# Patient Record
Sex: Female | Born: 1945 | Race: White | Hispanic: No | Marital: Married | Smoking: Never smoker
Health system: Southern US, Community
[De-identification: ages and names within clinical notes are randomized; demographics above are authoritative.]

## PROBLEM LIST (undated history)

## (undated) DIAGNOSIS — E559 Vitamin D deficiency, unspecified: Secondary | ICD-10-CM

## (undated) DIAGNOSIS — F419 Anxiety disorder, unspecified: Secondary | ICD-10-CM

## (undated) DIAGNOSIS — M810 Age-related osteoporosis without current pathological fracture: Secondary | ICD-10-CM

## (undated) DIAGNOSIS — N390 Urinary tract infection, site not specified: Secondary | ICD-10-CM

## (undated) DIAGNOSIS — M48062 Spinal stenosis, lumbar region with neurogenic claudication: Secondary | ICD-10-CM

## (undated) DIAGNOSIS — E785 Hyperlipidemia, unspecified: Secondary | ICD-10-CM

## (undated) DIAGNOSIS — Z9889 Other specified postprocedural states: Secondary | ICD-10-CM

## (undated) DIAGNOSIS — Z923 Personal history of irradiation: Secondary | ICD-10-CM

## (undated) DIAGNOSIS — F329 Major depressive disorder, single episode, unspecified: Secondary | ICD-10-CM

## (undated) DIAGNOSIS — F32A Depression, unspecified: Secondary | ICD-10-CM

## (undated) DIAGNOSIS — Z9221 Personal history of antineoplastic chemotherapy: Secondary | ICD-10-CM

## (undated) DIAGNOSIS — R112 Nausea with vomiting, unspecified: Secondary | ICD-10-CM

## (undated) DIAGNOSIS — C50919 Malignant neoplasm of unspecified site of unspecified female breast: Secondary | ICD-10-CM

## (undated) DIAGNOSIS — M66849 Spontaneous rupture of other tendons, unspecified hand: Secondary | ICD-10-CM

## (undated) DIAGNOSIS — E039 Hypothyroidism, unspecified: Secondary | ICD-10-CM

## (undated) DIAGNOSIS — E079 Disorder of thyroid, unspecified: Secondary | ICD-10-CM

## (undated) DIAGNOSIS — D696 Thrombocytopenia, unspecified: Secondary | ICD-10-CM

## (undated) DIAGNOSIS — M51369 Other intervertebral disc degeneration, lumbar region without mention of lumbar back pain or lower extremity pain: Secondary | ICD-10-CM

## (undated) DIAGNOSIS — C4490 Unspecified malignant neoplasm of skin, unspecified: Secondary | ICD-10-CM

## (undated) HISTORY — DX: Hyperlipidemia, unspecified: E78.5

## (undated) HISTORY — DX: Anxiety disorder, unspecified: F41.9

## (undated) HISTORY — DX: Unspecified malignant neoplasm of skin, unspecified: C44.90

## (undated) HISTORY — DX: Malignant neoplasm of unspecified site of unspecified female breast: C50.919

## (undated) HISTORY — DX: Spontaneous rupture of other tendons, unspecified hand: M66.849

## (undated) HISTORY — PX: HARDWARE REMOVAL: SHX979

## (undated) HISTORY — DX: Depression, unspecified: F32.A

## (undated) HISTORY — PX: BREAST EXCISIONAL BIOPSY: SUR124

## (undated) HISTORY — DX: Disorder of thyroid, unspecified: E07.9

## (undated) HISTORY — DX: Major depressive disorder, single episode, unspecified: F32.9

---

## 1981-01-21 HISTORY — PX: ABDOMINAL HYSTERECTOMY: SHX81

## 1999-01-22 DIAGNOSIS — C50919 Malignant neoplasm of unspecified site of unspecified female breast: Secondary | ICD-10-CM

## 1999-01-22 HISTORY — DX: Malignant neoplasm of unspecified site of unspecified female breast: C50.919

## 1999-01-22 HISTORY — PX: BREAST BIOPSY: SHX20

## 1999-08-16 HISTORY — PX: BREAST LUMPECTOMY: SHX2

## 2001-01-21 HISTORY — PX: PATELLA FRACTURE SURGERY: SHX735

## 2001-01-21 HISTORY — PX: KNEE SURGERY: SHX244

## 2003-12-22 ENCOUNTER — Ambulatory Visit: Payer: Self-pay | Admitting: Oncology

## 2004-01-22 ENCOUNTER — Ambulatory Visit: Payer: Self-pay | Admitting: Oncology

## 2004-01-22 HISTORY — PX: OTHER SURGICAL HISTORY: SHX169

## 2004-01-22 HISTORY — PX: MOHS SURGERY: SUR867

## 2004-04-24 ENCOUNTER — Ambulatory Visit: Payer: Self-pay | Admitting: General Surgery

## 2004-06-21 ENCOUNTER — Ambulatory Visit: Payer: Self-pay | Admitting: Oncology

## 2004-07-21 ENCOUNTER — Ambulatory Visit: Payer: Self-pay | Admitting: Oncology

## 2004-09-21 DIAGNOSIS — C4490 Unspecified malignant neoplasm of skin, unspecified: Secondary | ICD-10-CM

## 2004-09-21 HISTORY — DX: Unspecified malignant neoplasm of skin, unspecified: C44.90

## 2004-10-18 ENCOUNTER — Ambulatory Visit: Payer: Self-pay | Admitting: Unknown Physician Specialty

## 2004-12-20 ENCOUNTER — Ambulatory Visit: Payer: Self-pay | Admitting: Oncology

## 2005-01-08 ENCOUNTER — Emergency Department: Payer: Self-pay | Admitting: Emergency Medicine

## 2005-06-05 ENCOUNTER — Ambulatory Visit: Payer: Self-pay | Admitting: General Surgery

## 2005-06-18 ENCOUNTER — Ambulatory Visit: Payer: Self-pay | Admitting: Oncology

## 2005-06-21 ENCOUNTER — Ambulatory Visit: Payer: Self-pay | Admitting: Oncology

## 2006-01-21 HISTORY — PX: COLONOSCOPY: SHX174

## 2006-05-22 ENCOUNTER — Ambulatory Visit: Payer: Self-pay | Admitting: Oncology

## 2006-06-04 ENCOUNTER — Ambulatory Visit: Payer: Self-pay | Admitting: General Surgery

## 2006-06-18 ENCOUNTER — Ambulatory Visit: Payer: Self-pay | Admitting: Oncology

## 2006-06-22 ENCOUNTER — Ambulatory Visit: Payer: Self-pay | Admitting: Oncology

## 2006-11-22 ENCOUNTER — Ambulatory Visit: Payer: Self-pay | Admitting: Oncology

## 2006-12-12 ENCOUNTER — Ambulatory Visit: Payer: Self-pay | Admitting: Oncology

## 2006-12-22 ENCOUNTER — Ambulatory Visit: Payer: Self-pay | Admitting: Oncology

## 2007-06-22 ENCOUNTER — Ambulatory Visit: Payer: Self-pay | Admitting: Oncology

## 2007-06-22 ENCOUNTER — Ambulatory Visit: Payer: Self-pay | Admitting: General Surgery

## 2007-06-29 ENCOUNTER — Ambulatory Visit: Payer: Self-pay | Admitting: Oncology

## 2007-07-08 ENCOUNTER — Ambulatory Visit: Payer: Self-pay

## 2007-07-22 ENCOUNTER — Ambulatory Visit: Payer: Self-pay | Admitting: Oncology

## 2007-12-22 ENCOUNTER — Ambulatory Visit: Payer: Self-pay | Admitting: Oncology

## 2007-12-25 ENCOUNTER — Ambulatory Visit: Payer: Self-pay | Admitting: Oncology

## 2008-01-16 ENCOUNTER — Emergency Department: Payer: Self-pay | Admitting: Emergency Medicine

## 2008-01-19 ENCOUNTER — Ambulatory Visit: Payer: Self-pay | Admitting: Orthopedic Surgery

## 2008-01-22 ENCOUNTER — Ambulatory Visit: Payer: Self-pay | Admitting: Oncology

## 2008-01-22 HISTORY — PX: WRIST FRACTURE SURGERY: SHX121

## 2008-01-22 HISTORY — PX: WRIST SURGERY: SHX841

## 2008-04-05 ENCOUNTER — Ambulatory Visit: Payer: Self-pay | Admitting: Unknown Physician Specialty

## 2008-06-21 ENCOUNTER — Ambulatory Visit: Payer: Self-pay | Admitting: Oncology

## 2008-06-22 ENCOUNTER — Ambulatory Visit: Payer: Self-pay | Admitting: General Surgery

## 2008-06-24 ENCOUNTER — Ambulatory Visit: Payer: Self-pay | Admitting: Oncology

## 2008-07-21 ENCOUNTER — Ambulatory Visit: Payer: Self-pay | Admitting: Oncology

## 2009-06-21 ENCOUNTER — Ambulatory Visit: Payer: Self-pay | Admitting: Oncology

## 2009-06-29 ENCOUNTER — Ambulatory Visit: Payer: Self-pay | Admitting: General Surgery

## 2009-07-05 ENCOUNTER — Ambulatory Visit: Payer: Self-pay | Admitting: Oncology

## 2009-07-21 ENCOUNTER — Ambulatory Visit: Payer: Self-pay | Admitting: Oncology

## 2010-07-02 ENCOUNTER — Ambulatory Visit: Payer: Self-pay | Admitting: General Surgery

## 2010-07-06 ENCOUNTER — Ambulatory Visit: Payer: Self-pay | Admitting: Oncology

## 2010-07-07 LAB — CANCER ANTIGEN 27.29: CA 27.29: 38.1 U/mL (ref 0.0–38.6)

## 2010-07-22 ENCOUNTER — Ambulatory Visit: Payer: Self-pay | Admitting: Oncology

## 2011-07-04 ENCOUNTER — Ambulatory Visit: Payer: Self-pay | Admitting: General Surgery

## 2011-07-08 ENCOUNTER — Ambulatory Visit: Payer: Self-pay | Admitting: Oncology

## 2011-07-08 LAB — COMPREHENSIVE METABOLIC PANEL
Albumin: 4.2 g/dL (ref 3.4–5.0)
Anion Gap: 9 (ref 7–16)
BUN: 23 mg/dL — ABNORMAL HIGH (ref 7–18)
Bilirubin,Total: 0.2 mg/dL (ref 0.2–1.0)
Chloride: 109 mmol/L — ABNORMAL HIGH (ref 98–107)
Co2: 25 mmol/L (ref 21–32)
Creatinine: 0.81 mg/dL (ref 0.60–1.30)
EGFR (African American): >60
EGFR (Non-African Amer.): >60
Osmolality: 290 (ref 275–301)
Potassium: 4.3 mmol/L (ref 3.5–5.1)
SGOT(AST): 25 U/L (ref 15–37)
SGPT (ALT): 32 U/L
Sodium: 143 mmol/L (ref 136–145)

## 2011-07-08 LAB — CBC CANCER CENTER
Basophil #: 0 x10 3/mm (ref 0.0–0.1)
Lymphocyte #: 0.9 x10 3/mm — ABNORMAL LOW (ref 1.0–3.6)
MCHC: 32.7 g/dL (ref 32.0–36.0)
Monocyte #: 0.5 x10 3/mm (ref 0.2–0.9)
Monocyte %: 11 %
Neutrophil #: 3.3 x10 3/mm (ref 1.4–6.5)
Platelet: 163 x10 3/mm (ref 150–440)
RDW: 13.4 % (ref 11.5–14.5)
WBC: 5 x10 3/mm (ref 3.6–11.0)

## 2011-07-09 LAB — CANCER ANTIGEN 27.29: CA 27.29: 30.9 U/mL (ref 0.0–38.6)

## 2011-07-22 ENCOUNTER — Ambulatory Visit: Payer: Self-pay | Admitting: Oncology

## 2012-06-05 ENCOUNTER — Encounter: Payer: Self-pay | Admitting: General Surgery

## 2012-07-21 ENCOUNTER — Ambulatory Visit: Payer: Self-pay | Admitting: General Surgery

## 2012-07-21 ENCOUNTER — Encounter: Payer: Self-pay | Admitting: General Surgery

## 2012-08-06 ENCOUNTER — Ambulatory Visit: Payer: Self-pay | Admitting: General Surgery

## 2012-08-10 ENCOUNTER — Ambulatory Visit: Payer: Self-pay | Admitting: General Surgery

## 2012-08-18 ENCOUNTER — Encounter: Payer: Self-pay | Admitting: General Surgery

## 2012-08-18 ENCOUNTER — Ambulatory Visit (INDEPENDENT_AMBULATORY_CARE_PROVIDER_SITE_OTHER): Payer: Medicare Other | Admitting: General Surgery

## 2012-08-18 VITALS — BP 140/76 | HR 74 | Resp 12 | Ht 64.0 in | Wt 162.0 lb

## 2012-08-18 DIAGNOSIS — Z853 Personal history of malignant neoplasm of breast: Secondary | ICD-10-CM

## 2012-08-18 NOTE — Patient Instructions (Signed)
Patient to return in one year. 

## 2012-08-18 NOTE — Progress Notes (Signed)
Patient ID: Jenny Roberts, female   DOB: 1945/10/22, 67 y.o.   MRN: [id number]  Chief Complaint  Patient presents with  . Follow-up    mammogram    HPI Jenny Roberts is a 67 y.o. female who if here for a 1 year follow up. The most recent mammogram was done at [facility] on 07/21/12. Cat 2 The patient has a history of breast cancer of the right breast in 2001 that was treated with lumpectomy followed by chemo and radiation therapy.  Patient does perform regular self breast checks and gets regular mammograms done.   HPI  Past Medical History  Diagnosis Date  . Thyroid disease   . Hyperlipidemia   . Anxiety   . Depression   . Breast cancer 2001    Right. Treated with lumpectomy followed by Chemotherapy and Radiation therapy  . Skin cancer 09/2004    treated at [facility]    Past Surgical History  Procedure Laterality Date  . Abdominal hysterectomy  1983  . Mole excision  2006    Basal cell   . Breast biopsy Right 2001  . Breast lumpectomy Right 2001  . Wrist surgery Right 2010  . Colonoscopy  2008  . Knee surgery  2003    Family History  Problem Relation Age of Onset  . Brain cancer Father     Social History History  Substance Use Topics  . Smoking status: Never Smoker   . Smokeless tobacco: Never Used  . Alcohol Use: No    Allergies  Allergen Reactions  . Codeine Hives    shakes  . Latex     Current Outpatient Prescriptions  Medication Sig Dispense Refill  . clonazePAM (KLONOPIN) 0.5 MG tablet Take 1 tablet by mouth daily.      Marland Kitchen levothyroxine (SYNTHROID, LEVOTHROID) 88 MCG tablet Take 1 tablet by mouth daily.      Marland Kitchen lovastatin (MEVACOR) 40 MG tablet Take 1 tablet by mouth daily.      . sertraline (ZOLOFT) 100 MG tablet Take 1 tablet by mouth daily.       No current facility-administered medications for this visit.    Review of Systems Review of Systems  Constitutional: Negative.   Respiratory: Negative.   Cardiovascular: Negative.     Blood pressure  140/76, pulse 74, resp. rate 12, height 5\' 4"  (1.626 m), weight 162 lb (73.483 kg).  Physical Exam Physical Exam  Constitutional: She is oriented to person, place, and time. She appears well-developed and well-nourished.  Eyes: No scleral icterus.  Neck: Neck supple.  Cardiovascular: Normal rate, regular rhythm, normal heart sounds and normal pulses.   Pulses:      Dorsalis pedis pulses are 2+ on the right side, and 2+ on the left side.       Posterior tibial pulses are 2+ on the right side, and 2+ on the left side.  No edema   Pulmonary/Chest: Breath sounds normal. Right breast exhibits no inverted nipple, no mass, no nipple discharge, no skin change and no tenderness. Left breast exhibits no inverted nipple, no mass, no nipple discharge, no skin change and no tenderness.  Abdominal: Soft. Normal appearance. There is no hepatomegaly. There is no tenderness. No hernia.  Lymphadenopathy:    She has no cervical adenopathy.    She has no axillary adenopathy.  Neurological: She is alert and oriented to person, place, and time.  Skin: Skin is warm and dry.    Data Reviewed Mammogram reviewed -Stable  Assessment    Stable exam     Plan    Patient to return in one year bilateral diagnotic mammogram. Ordered CA 27-29       [facility] G 08/18/2012, 11:48 AM

## 2012-08-19 ENCOUNTER — Telehealth: Payer: Self-pay | Admitting: *Deleted

## 2012-08-19 NOTE — Telephone Encounter (Signed)
Inform patient that the most recent labs were elevated per Dr Evette Cristal.  He is going to get in touch with Dr Doylene Canning to review previous lab work.  If necessary then PET scan, if not then recheck labs at a future time.

## 2012-08-19 NOTE — Telephone Encounter (Signed)
Discussed with the patient in detail.

## 2012-08-20 ENCOUNTER — Telehealth: Payer: Self-pay | Admitting: *Deleted

## 2012-08-20 NOTE — Telephone Encounter (Signed)
Notified patient as instructed, patient pleased. Discussed follow-up appointments, patient agrees  

## 2012-09-12 ENCOUNTER — Inpatient Hospital Stay: Payer: Self-pay | Admitting: Internal Medicine

## 2012-09-12 LAB — URINALYSIS, COMPLETE
Bilirubin,UR: NEGATIVE
Glucose,UR: NEGATIVE mg/dL (ref 0–75)
Ketone: NEGATIVE
Nitrite: NEGATIVE
Ph: 6 (ref 4.5–8.0)
RBC,UR: 3 /HPF (ref 0–5)
Specific Gravity: 1.02 (ref 1.003–1.030)
Squamous Epithelial: <1
WBC UR: 14 /HPF (ref 0–5)

## 2012-09-12 LAB — COMPREHENSIVE METABOLIC PANEL
Albumin: 4.4 g/dL (ref 3.4–5.0)
Alkaline Phosphatase: 105 U/L (ref 50–136)
BUN: 21 mg/dL — ABNORMAL HIGH (ref 7–18)
Bilirubin,Total: 0.5 mg/dL (ref 0.2–1.0)
Chloride: 108 mmol/L — ABNORMAL HIGH (ref 98–107)
Co2: 24 mmol/L (ref 21–32)
Creatinine: 0.98 mg/dL (ref 0.60–1.30)
EGFR (Non-African Amer.): 60 — ABNORMAL LOW
Glucose: 144 mg/dL — ABNORMAL HIGH (ref 65–99)
Potassium: 4.1 mmol/L (ref 3.5–5.1)
SGOT(AST): 29 U/L (ref 15–37)
SGPT (ALT): 30 U/L (ref 12–78)
Sodium: 140 mmol/L (ref 136–145)
Total Protein: 7.2 g/dL (ref 6.4–8.2)

## 2012-09-12 LAB — CBC
HCT: 43.3 % (ref 35.0–47.0)
Platelet: 166 10*3/uL (ref 150–440)
RBC: 4.95 10*6/uL (ref 3.80–5.20)
RDW: 13.5 % (ref 11.5–14.5)

## 2012-09-12 LAB — LIPASE, BLOOD: Lipase: 56 U/L — ABNORMAL LOW (ref 73–393)

## 2012-09-13 LAB — HEMOGLOBIN
HGB: 13.8 g/dL (ref 12.0–16.0)
HGB: 13.8 g/dL (ref 12.0–16.0)

## 2012-09-13 LAB — BASIC METABOLIC PANEL
Anion Gap: 6 — ABNORMAL LOW (ref 7–16)
BUN: 14 mg/dL (ref 7–18)
Calcium, Total: 8.9 mg/dL (ref 8.5–10.1)
EGFR (Non-African Amer.): >60

## 2012-09-13 LAB — HEMATOCRIT: HCT: 39.7 % (ref 35.0–47.0)

## 2012-09-14 LAB — BASIC METABOLIC PANEL
Anion Gap: 5 — ABNORMAL LOW (ref 7–16)
BUN: 8 mg/dL (ref 7–18)
Calcium, Total: 8.8 mg/dL (ref 8.5–10.1)
Chloride: 111 mmol/L — ABNORMAL HIGH (ref 98–107)
EGFR (Non-African Amer.): >60
Glucose: 105 mg/dL — ABNORMAL HIGH (ref 65–99)
Osmolality: 282 (ref 275–301)
Potassium: 3.7 mmol/L (ref 3.5–5.1)

## 2012-09-14 LAB — CBC WITH DIFFERENTIAL/PLATELET
Basophil #: 0 10*3/uL (ref 0.0–0.1)
Eosinophil %: 1.9 %
HCT: 37.4 % (ref 35.0–47.0)
Lymphocyte #: 0.9 10*3/uL — ABNORMAL LOW (ref 1.0–3.6)
MCHC: 34.5 g/dL (ref 32.0–36.0)
Monocyte %: 9.4 %
Neutrophil %: 76.7 %
Platelet: 139 10*3/uL — ABNORMAL LOW (ref 150–440)
RDW: 13.3 % (ref 11.5–14.5)

## 2012-09-14 LAB — URINE CULTURE

## 2012-09-16 ENCOUNTER — Other Ambulatory Visit: Payer: Self-pay

## 2012-09-16 DIAGNOSIS — Z853 Personal history of malignant neoplasm of breast: Secondary | ICD-10-CM

## 2012-09-18 LAB — CULTURE, BLOOD (SINGLE)

## 2012-09-19 LAB — CANCER ANTIGEN 27.29: CA 27.29: 38.1 U/mL (ref 0.0–38.6)

## 2012-09-22 ENCOUNTER — Telehealth: Payer: Self-pay | Admitting: *Deleted

## 2012-09-22 NOTE — Telephone Encounter (Signed)
Notified patient as instructed, patient pleased. Discussed follow-up appointments, next week with Dr Evette Cristal, patient agrees

## 2012-09-22 NOTE — Progress Notes (Signed)
Quick Note:  Inform pt labs are normal. F/u as scheduled in 1 yr ______

## 2012-09-22 NOTE — Telephone Encounter (Signed)
Message copied by Currie Paris on Tue Sep 22, 2012 10:13 AM ------      Message from: Kieth Brightly      Created: Tue Sep 22, 2012  6:36 AM       Inform pt labs are normal. F/u as scheduled in 1 yr ------

## 2012-09-29 ENCOUNTER — Ambulatory Visit (INDEPENDENT_AMBULATORY_CARE_PROVIDER_SITE_OTHER): Payer: Medicare Other | Admitting: General Surgery

## 2012-09-29 ENCOUNTER — Encounter: Payer: Self-pay | Admitting: General Surgery

## 2012-09-29 VITALS — BP 110/80 | HR 78 | Resp 12 | Ht 64.0 in | Wt 159.0 lb

## 2012-09-29 DIAGNOSIS — Z853 Personal history of malignant neoplasm of breast: Secondary | ICD-10-CM | POA: Insufficient documentation

## 2012-09-29 DIAGNOSIS — K5732 Diverticulitis of large intestine without perforation or abscess without bleeding: Secondary | ICD-10-CM

## 2012-09-29 NOTE — Progress Notes (Signed)
Patient ID: Jenny Roberts, female   DOB: 1945-03-20, 67 y.o.   MRN: [id number]  Chief Complaint  Patient presents with  . Follow-up    diverticulitis    HPI Jenny Roberts is a 67 y.o. female who presents for a follow up from diverticulitis. She had an episode 2 weeks ago with diverticulitis and UTI evaluated with CT. She has completed a course of 3 antibiotics. She was known to have diverticulosis in previous colonoscopies but has not been noted to have diverticulitis in the past.  The patient denies any new or reoccurring problems at this time. She is feeling much better.    HPI  Past Medical History  Diagnosis Date  . Thyroid disease   . Hyperlipidemia   . Anxiety   . Depression   . Breast cancer 2001    Right. Treated with lumpectomy followed by Chemotherapy and Radiation therapy  . Skin cancer 09/2004    treated at [facility]    Past Surgical History  Procedure Laterality Date  . Abdominal hysterectomy  1983  . Mole excision  2006    Basal cell   . Breast biopsy Right 2001  . Breast lumpectomy Right 2001  . Wrist surgery Right 2010  . Colonoscopy  2008  . Knee surgery  2003    Family History  Problem Relation Age of Onset  . Brain cancer Father     Social History History  Substance Use Topics  . Smoking status: Never Smoker   . Smokeless tobacco: Never Used  . Alcohol Use: No    Allergies  Allergen Reactions  . Codeine Hives    shakes  . Latex     Current Outpatient Prescriptions  Medication Sig Dispense Refill  . clonazePAM (KLONOPIN) 0.5 MG tablet Take 1 tablet by mouth daily.      Marland Kitchen levothyroxine (SYNTHROID, LEVOTHROID) 88 MCG tablet Take 1 tablet by mouth daily.      Marland Kitchen lovastatin (MEVACOR) 40 MG tablet Take 1 tablet by mouth daily.      . sertraline (ZOLOFT) 100 MG tablet Take 1 tablet by mouth daily.       No current facility-administered medications for this visit.    Review of Systems Review of Systems  Constitutional: Negative.    Respiratory: Negative.   Cardiovascular: Negative.   Gastrointestinal: Negative.     Blood pressure 110/80, pulse 78, resp. rate 12, height 5\' 4"  (1.626 m), weight 159 lb (72.122 kg).  Physical Exam Physical Exam  Constitutional: She is oriented to person, place, and time. She appears well-developed and well-nourished.  Eyes: Conjunctivae are normal. No scleral icterus.  Pulmonary/Chest: Effort normal and breath sounds normal.  Abdominal: Soft. Bowel sounds are normal. There is no tenderness.  Neurological: She is alert and oriented to person, place, and time.  Skin: Skin is warm and dry.    Data Reviewed CT report showing mild diverticulitis uncomplicated.   Assessment    Patient has recovered from an episode of a UTI and diverticulitis. Incidentally herCA 27-29 is back to normal after mildly being elevated at last visit here.      Plan    Follow up as scheduled for breast cancer f/u and as needed for any recurrent abd symptoms.         Dream Nodal G 09/29/2012, 6:44 PM

## 2012-09-29 NOTE — Patient Instructions (Addendum)
Diverticulosis  Diverticulosis is a common condition that develops when small pouches (diverticula) form in the wall of the colon. The risk of diverticulosis increases with age. It happens more often in people who eat a low-fiber diet. Most individuals with diverticulosis have no symptoms. Those individuals with symptoms usually experience abdominal pain, constipation, or loose stools (diarrhea).  HOME CARE INSTRUCTIONS   · Increase the amount of fiber in your diet as directed by your caregiver or dietician. This may reduce symptoms of diverticulosis.  · Your caregiver may recommend taking a dietary fiber supplement.  · Drink at least 6 to 8 glasses of water each day to prevent constipation.  · Try not to strain when you have a bowel movement.  · Your caregiver may recommend avoiding nuts and seeds to prevent complications, although this is still an uncertain benefit.  · Only take over-the-counter or prescription medicines for pain, discomfort, or fever as directed by your caregiver.  FOODS WITH HIGH FIBER CONTENT INCLUDE:  · Fruits. Apple, peach, pear, tangerine, raisins, prunes.  · Vegetables. Brussels sprouts, asparagus, broccoli, cabbage, carrot, cauliflower, romaine lettuce, spinach, summer squash, tomato, winter squash, zucchini.  · Starchy Vegetables. Baked beans, kidney beans, lima beans, split peas, lentils, potatoes (with skin).  · Grains. Whole wheat bread, brown rice, bran flake cereal, plain oatmeal, white rice, shredded wheat, bran muffins.  SEEK IMMEDIATE MEDICAL CARE IF:   · You develop increasing pain or severe bloating.  · You have an oral temperature above 102° F (38.9° C), not controlled by medicine.  · You develop vomiting or bowel movements that are bloody or black.  Document Released: 10/05/2003 Document Revised: 04/01/2011 Document Reviewed: 06/07/2009  Exit[employer]® Patient Information ©2014 [employer].

## 2013-01-26 DIAGNOSIS — Z23 Encounter for immunization: Secondary | ICD-10-CM | POA: Diagnosis not present

## 2013-02-24 ENCOUNTER — Emergency Department: Payer: Self-pay | Admitting: Emergency Medicine

## 2013-02-24 DIAGNOSIS — S79919A Unspecified injury of unspecified hip, initial encounter: Secondary | ICD-10-CM | POA: Diagnosis not present

## 2013-02-24 DIAGNOSIS — Z888 Allergy status to other drugs, medicaments and biological substances status: Secondary | ICD-10-CM | POA: Diagnosis not present

## 2013-02-24 DIAGNOSIS — S99919A Unspecified injury of unspecified ankle, initial encounter: Secondary | ICD-10-CM | POA: Diagnosis not present

## 2013-02-24 DIAGNOSIS — S79929A Unspecified injury of unspecified thigh, initial encounter: Secondary | ICD-10-CM | POA: Diagnosis not present

## 2013-02-24 DIAGNOSIS — Z79899 Other long term (current) drug therapy: Secondary | ICD-10-CM | POA: Diagnosis not present

## 2013-02-24 DIAGNOSIS — M79609 Pain in unspecified limb: Secondary | ICD-10-CM | POA: Diagnosis not present

## 2013-02-24 DIAGNOSIS — IMO0001 Reserved for inherently not codable concepts without codable children: Secondary | ICD-10-CM | POA: Diagnosis not present

## 2013-02-24 DIAGNOSIS — S8000XA Contusion of unspecified knee, initial encounter: Secondary | ICD-10-CM | POA: Diagnosis not present

## 2013-02-24 DIAGNOSIS — M25569 Pain in unspecified knee: Secondary | ICD-10-CM | POA: Diagnosis not present

## 2013-02-24 DIAGNOSIS — IMO0002 Reserved for concepts with insufficient information to code with codable children: Secondary | ICD-10-CM | POA: Diagnosis not present

## 2013-02-24 DIAGNOSIS — S81009A Unspecified open wound, unspecified knee, initial encounter: Secondary | ICD-10-CM | POA: Diagnosis not present

## 2013-02-24 DIAGNOSIS — Z853 Personal history of malignant neoplasm of breast: Secondary | ICD-10-CM | POA: Diagnosis not present

## 2013-02-24 DIAGNOSIS — E785 Hyperlipidemia, unspecified: Secondary | ICD-10-CM | POA: Diagnosis not present

## 2013-02-24 DIAGNOSIS — S8990XA Unspecified injury of unspecified lower leg, initial encounter: Secondary | ICD-10-CM | POA: Diagnosis not present

## 2013-02-24 DIAGNOSIS — E039 Hypothyroidism, unspecified: Secondary | ICD-10-CM | POA: Diagnosis not present

## 2013-02-24 DIAGNOSIS — S99929A Unspecified injury of unspecified foot, initial encounter: Secondary | ICD-10-CM | POA: Diagnosis not present

## 2013-04-28 DIAGNOSIS — Z79899 Other long term (current) drug therapy: Secondary | ICD-10-CM | POA: Diagnosis not present

## 2013-04-28 DIAGNOSIS — E039 Hypothyroidism, unspecified: Secondary | ICD-10-CM | POA: Diagnosis not present

## 2013-04-28 DIAGNOSIS — E782 Mixed hyperlipidemia: Secondary | ICD-10-CM | POA: Diagnosis not present

## 2013-04-28 LAB — LIPID PANEL
CHOLESTEROL: 156 mg/dL (ref 0–200)
HDL: 47 mg/dL (ref 35–70)
LDL Cholesterol: 93 mg/dL

## 2013-04-28 LAB — HEPATIC FUNCTION PANEL: BILIRUBIN, TOTAL: 0.2 mg/dL

## 2013-04-28 LAB — BASIC METABOLIC PANEL
Creatinine: 0.7 mg/dL (ref 0.5–1.1)
Glucose: 115 mg/dL

## 2013-04-28 LAB — TSH: TSH: 2.29 u[IU]/mL (ref 0.41–5.90)

## 2013-05-06 DIAGNOSIS — N390 Urinary tract infection, site not specified: Secondary | ICD-10-CM | POA: Diagnosis not present

## 2013-05-06 DIAGNOSIS — Z01419 Encounter for gynecological examination (general) (routine) without abnormal findings: Secondary | ICD-10-CM | POA: Diagnosis not present

## 2013-07-22 DIAGNOSIS — D1801 Hemangioma of skin and subcutaneous tissue: Secondary | ICD-10-CM | POA: Diagnosis not present

## 2013-07-22 DIAGNOSIS — D235 Other benign neoplasm of skin of trunk: Secondary | ICD-10-CM | POA: Diagnosis not present

## 2013-07-22 DIAGNOSIS — Z85828 Personal history of other malignant neoplasm of skin: Secondary | ICD-10-CM | POA: Diagnosis not present

## 2013-07-22 DIAGNOSIS — L570 Actinic keratosis: Secondary | ICD-10-CM | POA: Diagnosis not present

## 2013-08-02 ENCOUNTER — Encounter: Payer: Self-pay | Admitting: General Surgery

## 2013-08-20 DIAGNOSIS — C50919 Malignant neoplasm of unspecified site of unspecified female breast: Secondary | ICD-10-CM | POA: Diagnosis not present

## 2013-08-20 DIAGNOSIS — Z09 Encounter for follow-up examination after completed treatment for conditions other than malignant neoplasm: Secondary | ICD-10-CM | POA: Diagnosis not present

## 2013-08-20 DIAGNOSIS — Z853 Personal history of malignant neoplasm of breast: Secondary | ICD-10-CM | POA: Diagnosis not present

## 2013-08-23 ENCOUNTER — Encounter: Payer: Self-pay | Admitting: General Surgery

## 2013-08-26 ENCOUNTER — Ambulatory Visit: Payer: No Typology Code available for payment source | Admitting: General Surgery

## 2013-09-08 ENCOUNTER — Ambulatory Visit (INDEPENDENT_AMBULATORY_CARE_PROVIDER_SITE_OTHER): Payer: Medicare Other | Admitting: General Surgery

## 2013-09-08 ENCOUNTER — Encounter: Payer: Self-pay | Admitting: General Surgery

## 2013-09-08 VITALS — BP 130/82 | HR 74 | Resp 12 | Ht 63.0 in | Wt 165.0 lb

## 2013-09-08 DIAGNOSIS — E785 Hyperlipidemia, unspecified: Secondary | ICD-10-CM | POA: Diagnosis not present

## 2013-09-08 DIAGNOSIS — C50911 Malignant neoplasm of unspecified site of right female breast: Secondary | ICD-10-CM

## 2013-09-08 DIAGNOSIS — C50919 Malignant neoplasm of unspecified site of unspecified female breast: Secondary | ICD-10-CM

## 2013-09-08 DIAGNOSIS — Z0000 Encounter for general adult medical examination without abnormal findings: Secondary | ICD-10-CM | POA: Diagnosis not present

## 2013-09-08 DIAGNOSIS — Z13220 Encounter for screening for lipoid disorders: Secondary | ICD-10-CM | POA: Diagnosis not present

## 2013-09-08 NOTE — Patient Instructions (Signed)
Patient to return in one year after bilateral mammogram.  Continue self breast exams. Call office for any new breast issues or concerns.

## 2013-09-08 NOTE — Progress Notes (Signed)
Patient ID: ANWITA MENCER, female   DOB: 1945-06-03, 68 y.o.   MRN: [id number]  Chief Complaint  Patient presents with  . Follow-up    mammogram    HPI AMALA PETION is a 68 y.o. female who presents for a breast cancer follow up . The most recent mammogram was done on 08/20/13 at [facility]  .  Patient does perform regular self breast checks and gets regular mammograms done.  No complaints.  HPI  Past Medical History  Diagnosis Date  . Thyroid disease   . Hyperlipidemia   . Anxiety   . Depression   . Breast cancer 2001    Right. Treated with lumpectomy followed by Chemotherapy and Radiation therapy  . Skin cancer 09/2004    treated at [facility]    Past Surgical History  Procedure Laterality Date  . Abdominal hysterectomy  1983  . Mole excision  2006    Basal cell   . Breast biopsy Right 2001  . Breast lumpectomy Right 2001  . Wrist surgery Right 2010  . Colonoscopy  2008  . Knee surgery  2003    Family History  Problem Relation Age of Onset  . Brain cancer Father     Social History History  Substance Use Topics  . Smoking status: Never Smoker   . Smokeless tobacco: Never Used  . Alcohol Use: No    Allergies  Allergen Reactions  . Codeine Hives    shakes  . Latex     Current Outpatient Prescriptions  Medication Sig Dispense Refill  . clonazePAM (KLONOPIN) 0.5 MG tablet Take 1 tablet by mouth daily.      Marland Kitchen levothyroxine (SYNTHROID, LEVOTHROID) 88 MCG tablet Take 1 tablet by mouth daily.      Marland Kitchen lovastatin (MEVACOR) 40 MG tablet Take 1 tablet by mouth daily.      . sertraline (ZOLOFT) 100 MG tablet Take 1 tablet by mouth daily.       No current facility-administered medications for this visit.    Review of Systems Review of Systems  Constitutional: Negative.   Respiratory: Negative.   Cardiovascular: Negative.     Blood pressure 130/82, pulse 74, resp. rate 12, height 5\' 3"  (1.6 m), weight 165 lb (74.844 kg).  Physical Exam Physical Exam   Constitutional: She is oriented to person, place, and time. She appears well-developed and well-nourished.  Eyes: Conjunctivae are normal. No scleral icterus.  Neck: Neck supple. No mass and no thyromegaly present.  Cardiovascular: Normal rate, regular rhythm and normal heart sounds.   Pulmonary/Chest: Effort normal and breath sounds normal. Right breast exhibits no inverted nipple, no mass, no nipple discharge, no skin change and no tenderness. Left breast exhibits no inverted nipple, no mass, no nipple discharge, no skin change and no tenderness.  Abdominal: Soft. Bowel sounds are normal. She exhibits no distension. There is no tenderness. There is no rebound and no guarding.  Lymphadenopathy:    She has no cervical adenopathy.    She has no axillary adenopathy.  Neurological: She is alert and oriented to person, place, and time.  Skin: Skin is warm and dry.    Data Reviewed Mammogram reviewed , stable   Assessment    Stable exam  14 years post treatment for right ca-lumpectomy,  sntinel node biopsy radiation and chemotherapy.     Plan    Patient to return in one year for bilateral diagnostic mammogram Check CA27-29 today. Also will get routine labs since her PCP is leaving.  Pt will get a new PCP on her own.       Timaya Bojarski G 09/09/2013, 8:20 AM

## 2013-09-09 ENCOUNTER — Encounter: Payer: Self-pay | Admitting: *Deleted

## 2013-09-09 ENCOUNTER — Encounter: Payer: Self-pay | Admitting: General Surgery

## 2013-09-09 ENCOUNTER — Other Ambulatory Visit: Payer: Self-pay | Admitting: *Deleted

## 2013-09-09 ENCOUNTER — Telehealth: Payer: Self-pay | Admitting: *Deleted

## 2013-09-09 DIAGNOSIS — C50919 Malignant neoplasm of unspecified site of unspecified female breast: Secondary | ICD-10-CM

## 2013-09-09 LAB — LIPID PANEL W/O CHOL/HDL RATIO
Cholesterol, Total: 130 mg/dL (ref 100–199)
HDL: 41 mg/dL (ref >39)
LDL Calculated: 76 mg/dL (ref 0–99)
TRIGLYCERIDES: 65 mg/dL (ref 0–149)
VLDL Cholesterol Cal: 13 mg/dL (ref 5–40)

## 2013-09-09 LAB — SPECIMEN STATUS REPORT

## 2013-09-09 LAB — COMPREHENSIVE METABOLIC PANEL
ALT: 19 IU/L (ref 0–32)
AST: 18 IU/L (ref 0–40)
Albumin/Globulin Ratio: 2.5 (ref 1.1–2.5)
Albumin: 4.3 g/dL (ref 3.6–4.8)
Alkaline Phosphatase: 102 IU/L (ref 39–117)
BUN/Creatinine Ratio: 29 — ABNORMAL HIGH (ref 11–26)
BUN: 21 mg/dL (ref 8–27)
CHLORIDE: 104 mmol/L (ref 97–108)
CO2: 23 mmol/L (ref 18–29)
Calcium: 9.3 mg/dL (ref 8.7–10.3)
Creatinine, Ser: 0.73 mg/dL (ref 0.57–1.00)
GFR calc non Af Amer: 85 mL/min/{1.73_m2} (ref >59)
GFR, EST AFRICAN AMERICAN: 98 mL/min/{1.73_m2} (ref >59)
GLOBULIN, TOTAL: 1.7 g/dL (ref 1.5–4.5)
GLUCOSE: 109 mg/dL — AB (ref 65–99)
Potassium: 4.5 mmol/L (ref 3.5–5.2)
Sodium: 143 mmol/L (ref 134–144)
TOTAL PROTEIN: 6 g/dL (ref 6.0–8.5)
Total Bilirubin: 0.2 mg/dL (ref 0.0–1.2)

## 2013-09-09 LAB — CBC WITH DIFFERENTIAL
Basophils Absolute: 0 10*3/uL (ref 0.0–0.2)
Basos: 0 %
EOS ABS: 0.2 10*3/uL (ref 0.0–0.4)
Eos: 3 %
HEMATOCRIT: 39.7 % (ref 34.0–46.6)
Hemoglobin: 13.5 g/dL (ref 11.1–15.9)
Immature Grans (Abs): 0 10*3/uL (ref 0.0–0.1)
Immature Granulocytes: 0 %
LYMPHS: 14 %
Lymphocytes Absolute: 1 10*3/uL (ref 0.7–3.1)
MCH: 29.2 pg (ref 26.6–33.0)
MCHC: 34 g/dL (ref 31.5–35.7)
MCV: 86 fL (ref 79–97)
Monocytes Absolute: 0.6 10*3/uL (ref 0.1–0.9)
Monocytes: 10 %
NEUTROS ABS: 4.9 10*3/uL (ref 1.4–7.0)
Neutrophils Relative %: 73 %
Platelets: 168 10*3/uL (ref 150–379)
RBC: 4.62 x10E6/uL (ref 3.77–5.28)
RDW: 13.7 % (ref 12.3–15.4)
WBC: 6.8 10*3/uL (ref 3.4–10.8)

## 2013-09-09 LAB — T4 AND TSH
T4, Total: 6.3 ug/dL (ref 4.5–12.0)
TSH: 0.751 u[IU]/mL (ref 0.450–4.500)

## 2013-09-09 LAB — LIPASE: LIPASE: 17 U/L (ref 0–59)

## 2013-09-09 LAB — CANCER ANTIGEN 27.29: CA 27.29: 34.2 U/mL (ref 0.0–38.6)

## 2013-09-09 NOTE — Telephone Encounter (Signed)
Message copied by Carson Myrtle on Thu Sep 09, 2013  8:35 AM ------      Message from: Christene Lye      Created: Thu Sep 09, 2013  7:50 AM       Labs are normal except for CA 27-29 which is mildly elevated.       Need to have this repeated in 6 weeks. Believe lipase was ordered in error-wanted lipid panel. ------

## 2013-09-09 NOTE — Telephone Encounter (Signed)
Notified patient as instructed, patient pleased. Discussed follow-up appointments and labs, patient agrees. Labs faxed to Dr Alonza Bogus office.

## 2013-09-10 DIAGNOSIS — J019 Acute sinusitis, unspecified: Secondary | ICD-10-CM | POA: Diagnosis not present

## 2013-09-15 DIAGNOSIS — H2510 Age-related nuclear cataract, unspecified eye: Secondary | ICD-10-CM | POA: Diagnosis not present

## 2013-10-21 DIAGNOSIS — C50919 Malignant neoplasm of unspecified site of unspecified female breast: Secondary | ICD-10-CM | POA: Diagnosis not present

## 2013-10-22 LAB — CANCER ANTIGEN 27.29: CA 27.29: 30.4 U/mL (ref 0.0–38.6)

## 2013-11-10 ENCOUNTER — Telehealth: Payer: Self-pay | Admitting: *Deleted

## 2013-11-10 NOTE — Telephone Encounter (Signed)
Notified patient as instructed, patient pleased. Discussed follow-up appointments, patient agrees  

## 2013-11-22 ENCOUNTER — Encounter: Payer: Self-pay | Admitting: General Surgery

## 2013-11-30 DIAGNOSIS — E039 Hypothyroidism, unspecified: Secondary | ICD-10-CM | POA: Diagnosis not present

## 2013-11-30 DIAGNOSIS — F339 Major depressive disorder, recurrent, unspecified: Secondary | ICD-10-CM | POA: Diagnosis not present

## 2013-11-30 DIAGNOSIS — F411 Generalized anxiety disorder: Secondary | ICD-10-CM | POA: Diagnosis not present

## 2014-01-21 HISTORY — PX: CARPAL TUNNEL RELEASE: SHX101

## 2014-01-26 DIAGNOSIS — Z85828 Personal history of other malignant neoplasm of skin: Secondary | ICD-10-CM | POA: Diagnosis not present

## 2014-01-26 DIAGNOSIS — D485 Neoplasm of uncertain behavior of skin: Secondary | ICD-10-CM | POA: Diagnosis not present

## 2014-01-26 DIAGNOSIS — D2339 Other benign neoplasm of skin of other parts of face: Secondary | ICD-10-CM | POA: Diagnosis not present

## 2014-02-09 DIAGNOSIS — D2222 Melanocytic nevi of left ear and external auricular canal: Secondary | ICD-10-CM | POA: Diagnosis not present

## 2014-02-09 DIAGNOSIS — L905 Scar conditions and fibrosis of skin: Secondary | ICD-10-CM | POA: Diagnosis not present

## 2014-04-26 ENCOUNTER — Emergency Department
Admit: 2014-04-26 | Disposition: A | Discharge status: Home / Self Care | Payer: Self-pay | Admitting: Emergency Medicine

## 2014-04-26 DIAGNOSIS — M79644 Pain in right finger(s): Secondary | ICD-10-CM | POA: Diagnosis not present

## 2014-04-26 DIAGNOSIS — F329 Major depressive disorder, single episode, unspecified: Secondary | ICD-10-CM | POA: Diagnosis not present

## 2014-04-26 DIAGNOSIS — M79641 Pain in right hand: Secondary | ICD-10-CM | POA: Diagnosis not present

## 2014-04-26 DIAGNOSIS — S6991XA Unspecified injury of right wrist, hand and finger(s), initial encounter: Secondary | ICD-10-CM | POA: Diagnosis not present

## 2014-04-26 DIAGNOSIS — S63601A Unspecified sprain of right thumb, initial encounter: Secondary | ICD-10-CM | POA: Diagnosis not present

## 2014-04-29 DIAGNOSIS — S66811A Strain of other specified muscles, fascia and tendons at wrist and hand level, right hand, initial encounter: Secondary | ICD-10-CM | POA: Diagnosis not present

## 2014-05-13 NOTE — H&P (Signed)
PATIENT NAME:  Jenny Roberts, Jenny Roberts MR#:  [id number] DATE OF BIRTH:  04/10/1945  DATE OF ADMISSION:  09/12/2012  PRIMARY CARE PHYSICIAN:  Dr. Davis Gourd.   REFERRING PHYSICIAN:  Delene Loll, physician assistant.  CHIEF COMPLAINT:  Nausea, vomiting, blood in the stool.   HISTORY OF PRESENT ILLNESS:  Jenny Roberts is a 69 year old pleasant white female with a past medical history of breast CA, hypertension, who woke up in the middle of the night at 4 in the morning with the complaints of nausea, vomiting and diarrhea.  Also noted to have blood in the stool.  This is associated with abdominal pain in the lower part of the abdomen, 10 by 10 in intensity.  Concerning this, came to the Emergency Department.  Work-up in the Emergency Department with CT abdomen and pelvis showed left-sided colitis.  The patient is also found to have elevated WBC of 16,000.  The patient received ciprofloxacin and Flagyl in the Emergency Department.  The patient ate some food from [place] one day prior to the incident.  Denied having any other sick contacts even though this food was shared by other family members.  Denied any recent travel.   PAST MEDICAL HISTORY: 1.  Breast CA.  2.  Hyperthyroidism.  3.  Depression.  4.  Hyperlipidemia.   PAST SURGICAL HISTORY: 1.  Hysterectomy.  2.  Lumpectomy.  3.  Left knee fracture and surgery.  4.  Right hand surgery.  5.  Skin cancer removed from the face.   ALLERGIES:  AUGMENTIN AND TYLENOL WITH CODEINE.   HOME MEDICATIONS: 1.  Synthroid 88 mcg once a day.  2.  Sertraline 100 mg once a day.  3.  Lovastatin 40 mg once a day.  4.  Clonazepam 0.5 mg once a day.   SOCIAL HISTORY:  No history of smoking, drinking alcohol or using illicit drugs.  Married, lives with her husband.   FAMILY HISTORY:  History of breast cancer.  Father died from brain cancer.  Mother died from alcohol abuse.   REVIEW OF SYSTEMS:  CONSTITUTIONAL:  Generalized weakness, fatigue.  EYES:  No  change in vision.  EARS, NOSE, THROAT:  No change in hearing.  No sore throat.  RESPIRATORY:  No cough, shortness of breath.  CARDIOVASCULAR:  No chest pain, palpitations.  GASTROINTESTINAL:  Has nausea, vomiting and diarrhea.  GENITOURINARY:  No dysuria or hematuria.  ENDOCRINE:  No polyuria or polydipsia.  HEMATOLOGIC:  No easy bruising or bleeding.  SKIN:  No rashes or lesions.  MUSCULOSKELETAL:  No joint pains and aches.   NEUROLOGIC:  No weakness or numbness in any part of the body.   PHYSICAL EXAMINATION: GENERAL:  This is a well-built, well-nourished, age-appropriate female lying down in the bed, not in distress.  VITAL SIGNS:  Temperature 99.6, pulse 95, blood pressure 146/70, respiratory rate of 20, oxygen saturations 92% on room air.  HEENT:  Head normocephalic, atraumatic.  Eyes, no scleral icterus.  Conjunctivae normal.  Pupils equal and react to light.  Mucous membranes moist.  No pharyngeal erythema.  NECK:  Supple.  No lymphadenopathy.  No JVD.  No carotid bruit.  No thyromegaly.  CHEST:  Has no focal tenderness.  LUNGS:  Bilateral clear to auscultation.  HEART:  S1, S2 regular.  No murmurs are heard.  No pedal edema.  Pulses 2+.  ABDOMEN:  Bowel sounds plus.  Soft, nondistended.  Has tenderness in the left side of the abdomen.  No rebound or guarding.  No  hepatosplenomegaly.  SKIN:  No rash or lesions.  MUSCULOSKELETAL:  Good range of motion in all the extremities.  NEUROLOGIC:  The patient is alert, oriented to place, person and time.  Cranial nerves II through XII intact.  Motor 5 by 5 in upper and lower extremities.   LABORATORY DATA:  UA 3+ leukocyte esterase, WBC of 14.  CT abdomen and pelvis, findings suggestive of colitis.  Diverticulitis cannot be excluded.  Lipase 56.  CMP is completely within normal limits.  CBC, WBC of 16, hemoglobin 15, platelet count of 166.   ASSESSMENT AND PLAN:  Jenny Roberts is a 69 year old female with history of hypertension and breast  cancer who comes to the Emergency Department, nausea, vomiting and abdominal pain and hematochezia.  1.  Left-sided colitis with hematochezia.  The differential diagnosis, gastroenteritis, ischemic colitis.  Admit the patient to the monitored bed.  The patient denied having any episodes of syncope or lightheadedness.  Keep the patient nothing by mouth.  Continue with IV fluids.  Keep the patient on Cipro and Flagyl.  2.  Hypertension, currently moderately well-controlled.  I will continue with home medications.  3.  Hematochezia as mentioned above, concern about ischemic colitis.  4.  History of breast cancer.  The patient follows up at [facility].  5.  Keep the patient on DVT prophylaxis with SCDs.   TIME SPENT:  45 minutes.    ____________________________ Monica Becton, MD pv:ea D: 09/13/2012 00:02:35 ET T: 09/13/2012 00:45:38 ET JOB#: [id number]  cc: Monica Becton, MD, <Dictator> John L. Davis Gourd, MD Grier Mitts Jonah Gingras MD ELECTRONICALLY SIGNED 09/23/2012 22:03

## 2014-05-13 NOTE — Discharge Summary (Signed)
PATIENT NAME:  Jenny Roberts, Jenny Roberts MR#:  [id number] DATE OF BIRTH:  1945/10/12  DATE OF ADMISSION:  09/12/2012 DATE OF DISCHARGE:  09/14/2012  DISCHARGE DIAGNOSES:  1. Diverticulitis.  2. Urinary tract infection.  3. Hyperlipidemia.  4. Hypothyroidism.   CONDITION ON DISCHARGE: Stable.   CODE STATUS: Full code.   MEDICATION ON DISCHARGE:  1. Synthroid 88 mcg oral tablet once a day.  2. Clonazepam 0.5 mg oral tablet once a day.  3. Sertraline 100 mg oral tablet once a day.  4. Lovastatin 40 mg once a day.  5. Flagyl 500 mg 2 times a day for 9 days.  6. Ciprofloxacin 500 mg 2 times a day for 9 days.  7. Cephalexin 250 mg oral tablet 4 times a day for 3 days.   DIET ON DISCHARGE: Regular.   ACTIVITY: As tolerated.   TIMEFRAME TO FOLLOWUP: Within 1 to 2 weeks with GI Clinic, to have colonoscopy next month.   HISTORY OF PRESENTING ILLNESS: Please see H and P done by Dr. Lunette Stands on 23rd of August. A 69 year old female with past medical history of breast cancer, hypertension, who woke up in the middle of night with complaint of nausea, vomiting and diarrhea. Also noted some blood in the stool. The pain was 10 out of 10 in lower abdomen, so she came to the Emergency Room. On CT of the abdomen and pelvis, it showed left-sided colitis. WBC count was 16,000, and so she was started on Cipro and Flagyl and admitted for further medical care.   HOSPITAL COURSE:  1. After having antibiotics for 1 day, the patient started feeling better, so she was started on liquid diet and gradually upgraded the next day, and she was tolerating it very nicely. So, after 2 days of in-hospital IV antibiotic treatment, we discharged home with oral antibiotic prescriptions.  Other medical issues:  2. Urinary tract infection. Urine culture was done, and it had E. coli which was resistant to ciprofloxacin, so the patient was also discharged on Keflex for a few more days.  3. Depression. We continued Zoloft and Klonopin.   4. Hypothyroidism. We continued Synthroid.  5. Hyperlipidemia. We continued statin.   IMPORTANT LABORATORY RESULTS IN THE HOSPITAL: White cell count on presentation 16,000, hemoglobin was 15.0 and platelet count was 166. Glucose was 144, BUN 21 and creatinine 0.98. Lipase was 56 on presentation. Urinalysis was positive, 14 WBCs and 3+ leukocyte esterase. Urine culture: More than 100,000 colony-forming units of E. coli which was sensitive to ceftriaxone, but resistant to Cipro and levofloxacin. CT of the abdomen and pelvis with contrast suggestive of colitis; diverticulitis cannot be excluded. Blood culture: No growth after 48 hours. Her white cell count came down to 7.6 after 2 days of treatment with IV antibiotics.   TOTAL TIME SPENT ON THIS DISCHARGE: 45 minutes.   ____________________________ Ceasar Lund Anselm Jungling, MD vgv:OSi D: 09/17/2012 07:14:33 ET T: 09/17/2012 08:39:42 ET JOB#: [id number]  cc: Ceasar Lund. Anselm Jungling, MD, <Dictator> Vaughan Basta MD ELECTRONICALLY SIGNED 09/22/2012 0:48

## 2014-05-31 DIAGNOSIS — S66811D Strain of other specified muscles, fascia and tendons at wrist and hand level, right hand, subsequent encounter: Secondary | ICD-10-CM | POA: Diagnosis not present

## 2014-06-15 ENCOUNTER — Ambulatory Visit (INDEPENDENT_AMBULATORY_CARE_PROVIDER_SITE_OTHER): Payer: Medicare Other | Admitting: Nurse Practitioner

## 2014-06-15 ENCOUNTER — Encounter: Payer: Self-pay | Admitting: Nurse Practitioner

## 2014-06-15 VITALS — BP 120/78 | HR 70 | Temp 98.0°F | Resp 16 | Ht 62.0 in | Wt 160.8 lb

## 2014-06-15 DIAGNOSIS — F411 Generalized anxiety disorder: Secondary | ICD-10-CM | POA: Diagnosis not present

## 2014-06-15 DIAGNOSIS — E785 Hyperlipidemia, unspecified: Secondary | ICD-10-CM | POA: Insufficient documentation

## 2014-06-15 DIAGNOSIS — M66849 Spontaneous rupture of other tendons, unspecified hand: Secondary | ICD-10-CM | POA: Insufficient documentation

## 2014-06-15 DIAGNOSIS — Z853 Personal history of malignant neoplasm of breast: Secondary | ICD-10-CM

## 2014-06-15 DIAGNOSIS — Z7689 Persons encountering health services in other specified circumstances: Secondary | ICD-10-CM

## 2014-06-15 DIAGNOSIS — M66841 Spontaneous rupture of other tendons, right hand: Secondary | ICD-10-CM | POA: Diagnosis not present

## 2014-06-15 HISTORY — DX: Spontaneous rupture of other tendons, unspecified hand: M66.849

## 2014-06-15 MED ORDER — LEVOTHYROXINE SODIUM 88 MCG PO TABS: 88.0000 ug | ORAL_TABLET | Freq: Every day | ORAL | Status: DC

## 2014-06-15 MED ORDER — CLONAZEPAM 0.5 MG PO TABS: 0.5000 mg | ORAL_TABLET | Freq: Every day | ORAL | Status: DC

## 2014-06-15 MED ORDER — LOVASTATIN 40 MG PO TABS: 40.0000 mg | ORAL_TABLET | Freq: Every day | ORAL | Status: DC

## 2014-06-15 NOTE — Assessment & Plan Note (Signed)
Discussed acute and chronic issues. Reviewed health maintenance measures, PFSHx, and immunizations. Obtain records from [facility].

## 2014-06-15 NOTE — Assessment & Plan Note (Signed)
Will obtain records with last labs. Refilled Lovastatin today. FU in 6 months, may need lipid panel.

## 2014-06-15 NOTE — Patient Instructions (Addendum)
Your prescriptions were sent to the pharmacy.   Follow up in 6 months for labs and other health maintenance measures.

## 2014-06-15 NOTE — Assessment & Plan Note (Signed)
Stable. S/p lumpectomy, chemo, radiation (per pt). Sees Dr. Jamal Collin for follow up.

## 2014-06-15 NOTE — Assessment & Plan Note (Signed)
Seeing Dr. Marry Guan and he will refer for surgical consult by a hand surgeon. Will follow.

## 2014-06-15 NOTE — Assessment & Plan Note (Signed)
Stable on Zoloft and Klonopin. Both refilled today (zoloft e rx and klonopin faxed). FU in 6 months.

## 2014-06-15 NOTE — Progress Notes (Signed)
Pre visit review using our clinic review tool, if applicable. No additional management support is needed unless otherwise documented below in the visit note. 

## 2014-06-15 NOTE — Progress Notes (Signed)
Subjective:    Patient ID: Jenny Roberts, female    DOB: 04-14-1945, 69 y.o.   MRN: [id number]  HPI  Jenny Roberts is a 69 yo female establishing care and medication refills.   1) New Pt info:   Immunizations- Unsure about tetanus, pneumonia vaccine 2015   Mammogram- June 2015   Pap- 5 years ago Hysterectomy   Bone Density- Unsure of date, approx 10 years ago  Colonoscopy- Jenny Roberts   Eye Exam- 2015   Dental Exam- UTD  2) Chronic Problems-  Breast Cancer- exams with Jenny Roberts   Lumpectomy- Right, Chemo and Radiation   Ruptured tendon of right thumb- Jenny Roberts   Anxiety- Zoloft and Klonopin   Cholesterol- checked at [facility]   Hypothyroidism- Levothyroxine, [facility] has latest labs   Review of Systems  Constitutional: Negative for fever, chills, diaphoresis and fatigue.  HENT: Negative for tinnitus and trouble swallowing.   Eyes: Negative for visual disturbance.  Respiratory: Negative for chest tightness, shortness of breath and wheezing.   Cardiovascular: Negative for chest pain, palpitations and leg swelling.  Gastrointestinal: Negative for nausea, vomiting, diarrhea and constipation.  Genitourinary: Negative for difficulty urinating.  Musculoskeletal: Positive for arthralgias. Negative for back pain and neck pain.       Knees   Skin: Negative for rash.  Neurological: Negative for dizziness, weakness, numbness and headaches.  Hematological: Does not bruise/bleed easily.  Psychiatric/Behavioral: Negative for suicidal ideas and sleep disturbance. The patient is not nervous/anxious.    Past Medical History  Diagnosis Date  . Thyroid disease   . Hyperlipidemia   . Anxiety   . Depression   . Breast cancer 2001    Right. Treated with lumpectomy followed by Chemotherapy and Radiation therapy  . Skin cancer 09/2004    treated at [facility]    History   Social History  . Marital Status: Married    Spouse Name: N/A  . Number of Children: N/A  . Years of Education: N/A     Occupational History  . Not on file.   Social History Main Topics  . Smoking status: Never Smoker   . Smokeless tobacco: Never Used  . Alcohol Use: No  . Drug Use: No  . Sexual Activity: Not Currently   Other Topics Concern  . Not on file   Social History Narrative   She is a homemaker and babysits   Children- 2 daughters    8 grandchildren, 3 great grandchildren    Pets: None   Caffeine- Decaf coffee, rare Jenny Roberts    Enjoys- gardening, cross stitch, and quilt    Past Surgical History  Procedure Laterality Date  . Mole excision  2006    Basal cell   . Breast biopsy Right 2001  . Breast lumpectomy Right 2001  . Wrist surgery Right 2010  . Colonoscopy  2008  . Knee surgery  2003  . Abdominal hysterectomy  1983    Left parts of each ovary     Family History  Problem Relation Age of Onset  . Brain cancer Father   . Cancer Maternal Grandfather 33    Colon Cancer    Allergies  Allergen Reactions  . Codeine Hives    shakes  . Latex     Current Outpatient Prescriptions on File Prior to Visit  Medication Sig Dispense Refill  . clonazePAM (KLONOPIN) 0.5 MG tablet Take 1 tablet by mouth daily.    Marland Kitchen levothyroxine (SYNTHROID, LEVOTHROID) 88 MCG tablet Take 1  tablet by mouth daily.    Marland Kitchen lovastatin (MEVACOR) 40 MG tablet Take 1 tablet by mouth daily.    . sertraline (ZOLOFT) 100 MG tablet Take 1 tablet by mouth daily.     No current facility-administered medications on file prior to visit.       Objective:   Physical Exam  Constitutional: She is oriented to person, place, and time. She appears well-developed and well-nourished. No distress.  BP 120/78 mmHg  Pulse 70  Temp(Src) 98 F (36.7 C)  Resp 16  Ht 5\' 2"  (1.575 m)  Wt 160 lb 12.8 oz (72.938 kg)  BMI 29.40 kg/m2  SpO2 95%   HENT:  Head: Normocephalic and atraumatic.  Right Ear: External ear normal.  Left Ear: External ear normal.  Cardiovascular: Normal rate, regular rhythm and normal heart  sounds.  Exam reveals no gallop and no friction rub.   No murmur heard. Pulmonary/Chest: Effort normal and breath sounds normal. No respiratory distress. She has no wheezes. She has no rales. She exhibits no tenderness.  Neurological: She is alert and oriented to person, place, and time. No cranial nerve deficit. She exhibits normal muscle tone. Coordination normal.  Skin: Skin is warm and dry. No rash noted. She is not diaphoretic.  Psychiatric: She has a normal mood and affect. Her behavior is normal. Judgment and thought content normal.      Assessment & Plan:

## 2014-07-15 DIAGNOSIS — M79645 Pain in left finger(s): Secondary | ICD-10-CM | POA: Diagnosis not present

## 2014-07-15 DIAGNOSIS — M79644 Pain in right finger(s): Secondary | ICD-10-CM | POA: Diagnosis not present

## 2014-07-20 ENCOUNTER — Other Ambulatory Visit: Payer: Self-pay

## 2014-07-20 DIAGNOSIS — Z853 Personal history of malignant neoplasm of breast: Secondary | ICD-10-CM

## 2014-08-08 LAB — HM COLONOSCOPY: HM Colonoscopy: NEGATIVE

## 2014-08-23 DIAGNOSIS — T84290A Other mechanical complication of internal fixation device of bones of hand and fingers, initial encounter: Secondary | ICD-10-CM | POA: Diagnosis not present

## 2014-08-23 DIAGNOSIS — M66341 Spontaneous rupture of flexor tendons, right hand: Secondary | ICD-10-CM | POA: Diagnosis not present

## 2014-08-23 DIAGNOSIS — G8918 Other acute postprocedural pain: Secondary | ICD-10-CM | POA: Diagnosis not present

## 2014-08-23 DIAGNOSIS — S63591A Other specified sprain of right wrist, initial encounter: Secondary | ICD-10-CM | POA: Diagnosis not present

## 2014-08-31 ENCOUNTER — Ambulatory Visit: Payer: Medicare Other | Admitting: General Surgery

## 2014-09-05 DIAGNOSIS — M79644 Pain in right finger(s): Secondary | ICD-10-CM | POA: Diagnosis not present

## 2014-09-05 DIAGNOSIS — M79645 Pain in left finger(s): Secondary | ICD-10-CM | POA: Diagnosis not present

## 2014-09-05 DIAGNOSIS — Z4789 Encounter for other orthopedic aftercare: Secondary | ICD-10-CM | POA: Diagnosis not present

## 2014-09-21 DIAGNOSIS — Z4789 Encounter for other orthopedic aftercare: Secondary | ICD-10-CM | POA: Diagnosis not present

## 2014-09-30 DIAGNOSIS — Z853 Personal history of malignant neoplasm of breast: Secondary | ICD-10-CM | POA: Diagnosis not present

## 2014-09-30 DIAGNOSIS — Z9889 Other specified postprocedural states: Secondary | ICD-10-CM | POA: Diagnosis not present

## 2014-09-30 DIAGNOSIS — R928 Other abnormal and inconclusive findings on diagnostic imaging of breast: Secondary | ICD-10-CM | POA: Diagnosis not present

## 2014-10-03 LAB — HM MAMMOGRAPHY: HM Mammogram: NORMAL

## 2014-10-06 ENCOUNTER — Ambulatory Visit: Payer: Medicare Other | Admitting: General Surgery

## 2014-10-07 DIAGNOSIS — Z4789 Encounter for other orthopedic aftercare: Secondary | ICD-10-CM | POA: Diagnosis not present

## 2014-10-07 DIAGNOSIS — G5601 Carpal tunnel syndrome, right upper limb: Secondary | ICD-10-CM | POA: Diagnosis not present

## 2014-10-07 DIAGNOSIS — M79644 Pain in right finger(s): Secondary | ICD-10-CM | POA: Diagnosis not present

## 2014-10-10 ENCOUNTER — Ambulatory Visit (INDEPENDENT_AMBULATORY_CARE_PROVIDER_SITE_OTHER): Payer: Medicare Other | Admitting: General Surgery

## 2014-10-10 ENCOUNTER — Encounter: Payer: Self-pay | Admitting: General Surgery

## 2014-10-10 VITALS — BP 128/62 | HR 76 | Resp 14 | Ht 63.0 in | Wt 163.0 lb

## 2014-10-10 DIAGNOSIS — Z853 Personal history of malignant neoplasm of breast: Secondary | ICD-10-CM | POA: Diagnosis not present

## 2014-10-10 DIAGNOSIS — C50911 Malignant neoplasm of unspecified site of right female breast: Secondary | ICD-10-CM

## 2014-10-10 NOTE — Patient Instructions (Signed)
Patient will be asked to return to the office in one year with a bilateral screening mammogram. 

## 2014-10-10 NOTE — Progress Notes (Signed)
Patient ID: Jenny Roberts, female   DOB: [birth year], 69 y.o.   MRN: [id number]  Chief Complaint  Patient presents with  . Follow-up    Mammogram    HPI Jenny Roberts is a 69 y.o. female here today for breast cancer follow upfollow up, bilateral mammogram done on 09/30/14. No new breast problems, she does perform self breast checks. She had previous thumb/carpal tunnel surgery in 08/2014. She is doing well.  HPI  Past Medical History  Diagnosis Date  . Thyroid disease   . Hyperlipidemia   . Anxiety   . Depression   . Breast cancer 2001    Right. Treated with lumpectomy followed by Chemotherapy and Radiation therapy  . Skin cancer 09/2004    treated at [facility]    Past Surgical History  Procedure Laterality Date  . Mole excision  2006    Basal cell   . Breast biopsy Right 2001  . Breast lumpectomy Right 2001  . Wrist surgery Right 2010  . Colonoscopy  2008  . Knee surgery  2003  . Abdominal hysterectomy  1983    Left parts of each ovary   . Carpal tunnel release  2016    Family History  Problem Relation Age of Onset  . Brain cancer Father   . Cancer Maternal Grandfather 31    Colon Cancer    Social History Social History  Substance Use Topics  . Smoking status: Never Smoker   . Smokeless tobacco: Never Used  . Alcohol Use: No    Allergies  Allergen Reactions  . Amoxicillin-Pot Clavulanate Diarrhea  . Codeine Hives    shakes  . Latex     Current Outpatient Prescriptions  Medication Sig Dispense Refill  . clonazePAM (KLONOPIN) 0.5 MG tablet Take 1 tablet (0.5 mg total) by mouth daily. 30 tablet 5  . levothyroxine (SYNTHROID, LEVOTHROID) 88 MCG tablet Take 1 tablet (88 mcg total) by mouth daily. 30 tablet 5  . lovastatin (MEVACOR) 40 MG tablet Take 1 tablet (40 mg total) by mouth daily. 30 tablet 5  . sertraline (ZOLOFT) 100 MG tablet Take 1 tablet by mouth daily.     No current facility-administered medications for this visit.    Review of  Systems Review of Systems  Constitutional: Negative.   Respiratory: Negative.   Cardiovascular: Negative.     Blood pressure 128/62, pulse 76, resp. rate 14, height _0  (1.6 m), weight 163 lb (73.936 kg).  Physical Exam Physical Exam  Constitutional: She is oriented to person, place, and time. She appears well-developed and well-nourished.  HENT:  Mouth/Throat: Oropharynx is clear and moist.  Eyes: Conjunctivae are normal. No scleral icterus.  Neck: Neck supple.  Cardiovascular: Normal rate, regular rhythm and normal heart sounds.   Pulmonary/Chest: Effort normal and breath sounds normal. Right breast exhibits no inverted nipple, no mass, no nipple discharge, no skin change and no tenderness. Left breast exhibits no inverted nipple, no mass, no nipple discharge, no skin change and no tenderness.  Lymphadenopathy:    She has no cervical adenopathy.    She has no axillary adenopathy.  Neurological: She is alert and oriented to person, place, and time.  Skin: Skin is warm.  Psychiatric: Her behavior is normal.    Data Reviewed Mammogram reviewed-stable  Assessment    Stable exam. History of right breast cancer    Plan    Plan to get a CA 27-29, CBC, MET C today. Patient will be asked to return to  the office in one year with a bilateral screening mammogram     UIQ:NVVYXA Doss, PA   SANKAR,SEEPLAPUTHUR G 10/10/2014, 3:43 PM

## 2014-10-11 LAB — COMPREHENSIVE METABOLIC PANEL
ALBUMIN: 4.5 g/dL (ref 3.6–4.8)
ALK PHOS: 92 IU/L (ref 39–117)
ALT: 22 IU/L (ref 0–32)
AST: 21 IU/L (ref 0–40)
Albumin/Globulin Ratio: 2.8 — ABNORMAL HIGH (ref 1.1–2.5)
BUN / CREAT RATIO: 28 — AB (ref 11–26)
BUN: 22 mg/dL (ref 8–27)
Bilirubin Total: 0.2 mg/dL (ref 0.0–1.2)
CO2: 23 mmol/L (ref 18–29)
CREATININE: 0.78 mg/dL (ref 0.57–1.00)
Calcium: 9.3 mg/dL (ref 8.7–10.3)
Chloride: 105 mmol/L (ref 97–108)
GFR calc non Af Amer: 78 mL/min/{1.73_m2} (ref >59)
GFR, EST AFRICAN AMERICAN: 90 mL/min/{1.73_m2} (ref >59)
GLOBULIN, TOTAL: 1.6 g/dL (ref 1.5–4.5)
GLUCOSE: 122 mg/dL — AB (ref 65–99)
Potassium: 4.5 mmol/L (ref 3.5–5.2)
SODIUM: 143 mmol/L (ref 134–144)
TOTAL PROTEIN: 6.1 g/dL (ref 6.0–8.5)

## 2014-10-11 LAB — CBC WITH DIFFERENTIAL/PLATELET
BASOS: 0 %
Basophils Absolute: 0 10*3/uL (ref 0.0–0.2)
EOS (ABSOLUTE): 0.1 10*3/uL (ref 0.0–0.4)
Eos: 3 %
Hematocrit: 41.1 % (ref 34.0–46.6)
Hemoglobin: 13.6 g/dL (ref 11.1–15.9)
IMMATURE GRANS (ABS): 0 10*3/uL (ref 0.0–0.1)
Immature Granulocytes: 0 %
Lymphocytes Absolute: 0.9 10*3/uL (ref 0.7–3.1)
Lymphs: 21 %
MCH: 29.1 pg (ref 26.6–33.0)
MCHC: 33.1 g/dL (ref 31.5–35.7)
MCV: 88 fL (ref 79–97)
Monocytes Absolute: 0.6 10*3/uL (ref 0.1–0.9)
Monocytes: 13 %
NEUTROS ABS: 2.8 10*3/uL (ref 1.4–7.0)
Neutrophils: 63 %
PLATELETS: 160 10*3/uL (ref 150–379)
RBC: 4.68 x10E6/uL (ref 3.77–5.28)
RDW: 14.1 % (ref 12.3–15.4)
WBC: 4.6 10*3/uL (ref 3.4–10.8)

## 2014-10-11 LAB — CANCER ANTIGEN 27.29: CA 27.29: 33 U/mL (ref 0.0–38.6)

## 2014-10-12 ENCOUNTER — Telehealth: Payer: Self-pay | Admitting: *Deleted

## 2014-10-12 NOTE — Telephone Encounter (Signed)
-----   Message from Christene Lye, MD sent at 10/12/2014  3:07 PM EDT ----- Inform pt labs are normal. F/u as scheduled

## 2014-10-12 NOTE — Progress Notes (Signed)
Quick Note:  Inform pt labs are normal. F/u as scheduled ______ 

## 2014-10-12 NOTE — Telephone Encounter (Signed)
Notified patient as instructed, patient pleased. Discussed follow-up appointments, patient agrees  

## 2014-10-21 DIAGNOSIS — Z23 Encounter for immunization: Secondary | ICD-10-CM | POA: Diagnosis not present

## 2014-11-07 DIAGNOSIS — G5601 Carpal tunnel syndrome, right upper limb: Secondary | ICD-10-CM | POA: Diagnosis not present

## 2014-11-07 DIAGNOSIS — Z4789 Encounter for other orthopedic aftercare: Secondary | ICD-10-CM | POA: Diagnosis not present

## 2014-11-07 DIAGNOSIS — M79644 Pain in right finger(s): Secondary | ICD-10-CM | POA: Diagnosis not present

## 2014-11-08 ENCOUNTER — Encounter: Payer: Self-pay | Admitting: General Surgery

## 2014-11-09 ENCOUNTER — Telehealth: Payer: Self-pay

## 2014-11-09 NOTE — Telephone Encounter (Signed)
Notified patient of Frederico Hamman comments. Patient states that she will try the Mucinex and cough drops. Patient states she will let us know if she doesn't improve within 5 days.

## 2014-11-09 NOTE — Telephone Encounter (Signed)
I received a phone message from patient stating that she believes she has Laryngitis. Patient states that it will be hard for her to schedule appt at the moment, but she feels awful. Patient wants to know if anything can be prescribed, or if there is anything OTC that she can take in order to feel better. Thanks!

## 2014-11-09 NOTE — Telephone Encounter (Signed)
Please let Jenny Roberts know that there are a million causes for laryngitis. Since I don't know any of her symptoms (fever, sore throat, how long this has been going on) and she cannot make it in. I would suggest using OTC mucinex, she can use any OTC cough drops or throat sprays, use warm/room temperature liquids, and voice rest (speak as little as possible). Follow up if not improved in 5 days or fever.

## 2014-11-10 ENCOUNTER — Other Ambulatory Visit: Payer: Self-pay

## 2014-11-10 ENCOUNTER — Encounter: Payer: Self-pay | Admitting: General Surgery

## 2014-11-10 ENCOUNTER — Telehealth: Payer: Self-pay | Admitting: *Deleted

## 2014-11-10 MED ORDER — SERTRALINE HCL 100 MG PO TABS: 100.0000 mg | ORAL_TABLET | Freq: Every day | ORAL | Status: DC

## 2014-11-10 NOTE — Telephone Encounter (Signed)
Patient has requested sertraline to be refilled- thanks

## 2014-11-10 NOTE — Telephone Encounter (Signed)
Completed.

## 2014-11-16 ENCOUNTER — Encounter: Payer: Self-pay | Admitting: Nurse Practitioner

## 2014-11-16 ENCOUNTER — Ambulatory Visit (INDEPENDENT_AMBULATORY_CARE_PROVIDER_SITE_OTHER): Payer: Medicare Other | Admitting: Nurse Practitioner

## 2014-11-16 VITALS — BP 110/82 | HR 70 | Temp 97.9°F | Resp 14 | Ht 63.0 in | Wt 162.8 lb

## 2014-11-16 DIAGNOSIS — J040 Acute laryngitis: Secondary | ICD-10-CM

## 2014-11-16 MED ORDER — METHYLPREDNISOLONE 4 MG PO TABS: ORAL_TABLET | ORAL | Status: DC

## 2014-11-16 NOTE — Progress Notes (Signed)
Patient ID: Jenny Roberts, female    DOB: [birth year]  Age: 69 y.o. MRN: [id number]  CC: Laryngitis   HPI Jenny Roberts presents for establishing care and CC of laryngitis.   1) Has been concerned with laryngitis x 3 weeks. No soreness of throat, head feels stuffy. Denies fever.  Treatment to date:   Mucinex  [place] has a past medical history of Thyroid disease; Hyperlipidemia; Anxiety; Depression; Breast cancer (2001); and Skin cancer (09/2004).   She has past surgical history that includes mole excision (2006); Breast biopsy (Right, 2001); Breast lumpectomy (Right, 2001); Wrist surgery (Right, 2010); Colonoscopy (2008); Knee surgery (2003); Abdominal hysterectomy (1983); and Carpal tunnel release (2016).   Her family history includes Brain cancer in her father; Cancer (age of onset: 3) in her maternal grandfather.She reports that she has never smoked. She has never used smokeless tobacco. She reports that she does not drink alcohol or use illicit drugs.  Outpatient Prescriptions Prior to Visit  Medication Sig Dispense Refill  . clonazePAM (KLONOPIN) 0.5 MG tablet Take 1 tablet (0.5 mg total) by mouth daily. 30 tablet 5  . levothyroxine (SYNTHROID, LEVOTHROID) 88 MCG tablet Take 1 tablet (88 mcg total) by mouth daily. 30 tablet 5  . lovastatin (MEVACOR) 40 MG tablet Take 1 tablet (40 mg total) by mouth daily. 30 tablet 5  . sertraline (ZOLOFT) 100 MG tablet Take 1 tablet (100 mg total) by mouth daily. 30 tablet 1   No facility-administered medications prior to visit.    ROS Review of Systems  Constitutional: Negative for fever, chills, diaphoresis and fatigue.  HENT: Positive for voice change. Negative for sore throat, tinnitus and trouble swallowing.   Respiratory: Negative for chest tightness, shortness of breath and wheezing.   Cardiovascular: Negative for chest pain, palpitations and leg swelling.  Gastrointestinal: Negative for nausea,  vomiting and diarrhea.  Skin: Negative for rash.  Neurological: Negative for dizziness, weakness, numbness and headaches.  Psychiatric/Behavioral: The patient is not nervous/anxious.     Objective:  BP 110/82 mmHg  Pulse 70  Temp(Src) 97.9 F (36.6 C)  Resp 14  Ht 5\' 3"  (1.6 m)  Wt 162 lb 12.8 oz (73.846 kg)  BMI 28.85 kg/m2  SpO2 99%  Physical Exam  Constitutional: She is oriented to person, place, and time. She appears well-developed and well-nourished. No distress.  HENT:  Head: Normocephalic and atraumatic.  Right Ear: External ear normal.  Left Ear: External ear normal.  Mouth/Throat: No oropharyngeal exudate.  Eyes: EOM are normal. Pupils are equal, round, and reactive to light. Right eye exhibits no discharge. Left eye exhibits no discharge. No scleral icterus.  Neck: Normal range of motion. Neck supple.  Cardiovascular: Normal rate, regular rhythm and normal heart sounds.  Exam reveals no gallop and no friction rub.   No murmur heard. Pulmonary/Chest: Effort normal and breath sounds normal. No respiratory distress. She has no wheezes. She has no rales. She exhibits no tenderness.  Lymphadenopathy:    She has no cervical adenopathy.  Neurological: She is alert and oriented to person, place, and time. No cranial nerve deficit. She exhibits normal muscle tone. Coordination normal.  Skin: Skin is warm and dry. No rash noted. She is not diaphoretic.  Psychiatric: She has a normal mood and affect. Her behavior is normal. Judgment and thought content normal.   Assessment & Plan:   Jenny Roberts was seen today for laryngitis.  Diagnoses and all orders for this visit:  Laryngitis  Other orders -     methylPREDNISolone (MEDROL) 4 MG tablet; Take 6 tablets by mouth with breakfast or lunch and decrease by 1 tablet each day until gone.  I am having Jenny Roberts start on methylPREDNISolone. I am also having her maintain her lovastatin, levothyroxine, clonazePAM, and sertraline.  Meds  ordered this encounter  Medications  . methylPREDNISolone (MEDROL) 4 MG tablet    Sig: Take 6 tablets by mouth with breakfast or lunch and decrease by 1 tablet each day until gone.    Dispense:  21 tablet    Refill:  0    Order Specific Question:  Supervising Provider    Answer:  Crecencio Mc [[id number]]     Follow-up: Return if symptoms worsen or fail to improve.

## 2014-11-16 NOTE — Progress Notes (Signed)
Pre visit review using our clinic review tool, if applicable. No additional management support is needed unless otherwise documented below in the visit note. 

## 2014-11-16 NOTE — Patient Instructions (Signed)
Prednisone with breakfast or lunch at the latest.  6 tablets on day 1, 5 tablets on day 2, 4 tablets on day 3, 3 tablets on day 4, 2 tablets day 5, 1 tablet on day 6...done! Take tablets all together not spaced out  Don't take with NSAIDs (Ibuprofen, Aleve, Naproxen, Meloxicam ect...) Okay to continue Aspirin.   Zantac over the counter try this and see if it is helpful (sometimes reflux can masquerade as hoarseness)

## 2014-11-16 NOTE — Assessment & Plan Note (Signed)
Patient had laryngitis 3 weeks. She is reporting that everything else feels fine except for her hoarseness. Her husband has hearing problems and it is now becoming difficult to communicate. Patient reports she has tried "everything" which includes Mucinex Alka-Seltzer and gargling. Asked her to try an over-the-counter acid reducer as well as a 4 mg tablet prednisone taper. We will call in one week and check on progress.  If no improvement we will follow-up with ENT.

## 2014-11-22 ENCOUNTER — Telehealth: Payer: Self-pay

## 2014-11-22 NOTE — Telephone Encounter (Signed)
Pt stated that she was feeling a little better and that she had on more pill left. She said she was going to give it a couple more days and if she wasn't feeling as good as she thinks she should she will give [place] a call.

## 2014-11-22 NOTE — Telephone Encounter (Signed)
-----   Message from Rubbie Battiest, NP sent at 11/22/2014  8:46 AM EDT ----- Please call patient and see how prednisone and or Zantac did for her laryngitis and if she is improving. Thank you!

## 2014-12-06 ENCOUNTER — Encounter: Payer: Self-pay | Admitting: Nurse Practitioner

## 2014-12-06 ENCOUNTER — Ambulatory Visit (INDEPENDENT_AMBULATORY_CARE_PROVIDER_SITE_OTHER): Payer: Medicare Other | Admitting: Nurse Practitioner

## 2014-12-06 VITALS — BP 119/80 | HR 77 | Temp 99.2°F | Resp 18 | Wt 162.0 lb

## 2014-12-06 DIAGNOSIS — J040 Acute laryngitis: Secondary | ICD-10-CM | POA: Diagnosis not present

## 2014-12-06 MED ORDER — FLUTICASONE PROPIONATE 50 MCG/ACT NA SUSP: 2.0000 | Freq: Every day | NASAL | Status: DC

## 2014-12-06 NOTE — Progress Notes (Signed)
Patient ID: Jenny Roberts, female    DOB: [birth year]  Age: 69 y.o. MRN: [id number]  CC: Sore Throat   HPI [name] presents for follow up of laryngitis.   1) Trial of prednisone not helpful  Mucinex, Alkaseltzer, gargling not helpful  Prednisone helpful, 1 week over stopping prednisone came back.  Been using acid reflux medication  Minor sore throat on left started 3-4 days  Pressure in left ear   History Jenny Roberts has a past medical history of Thyroid disease; Hyperlipidemia; Anxiety; Depression; Breast cancer ([place]) (2001); and Skin cancer (09/2004).   She has past surgical history that includes mole excision (2006); Breast biopsy (Right, 2001); Breast lumpectomy (Right, 2001); Wrist surgery (Right, 2010); Colonoscopy (2008); Knee surgery (2003); Abdominal hysterectomy (1983); and Carpal tunnel release (2016).   Her family history includes Brain cancer in her father; Cancer (age of onset: 72) in her maternal grandfather.She reports that she has never smoked. She has never used smokeless tobacco. She reports that she does not drink alcohol or use illicit drugs.  Outpatient Prescriptions Prior to Visit  Medication Sig Dispense Refill  . clonazePAM (KLONOPIN) 0.5 MG tablet Take 1 tablet (0.5 mg total) by mouth daily. 30 tablet 5  . levothyroxine (SYNTHROID, LEVOTHROID) 88 MCG tablet Take 1 tablet (88 mcg total) by mouth daily. 30 tablet 5  . lovastatin (MEVACOR) 40 MG tablet Take 1 tablet (40 mg total) by mouth daily. 30 tablet 5  . sertraline (ZOLOFT) 100 MG tablet Take 1 tablet (100 mg total) by mouth daily. 30 tablet 1  . methylPREDNISolone (MEDROL) 4 MG tablet Take 6 tablets by mouth with breakfast or lunch and decrease by 1 tablet each day until gone. (Patient not taking: Reported on 12/06/2014) 21 tablet 0   No facility-administered medications prior to visit.    ROS Review of Systems  Constitutional: Negative for fever, chills, diaphoresis and fatigue.  HENT: Positive for  ear pain, sore throat and voice change. Negative for congestion and ear discharge.   Respiratory: Negative for cough, chest tightness, shortness of breath and wheezing.   Cardiovascular: Negative for chest pain, palpitations and leg swelling.  Gastrointestinal: Negative for nausea, vomiting and diarrhea.  Musculoskeletal: Negative for neck pain and neck stiffness.  Skin: Negative for rash.  Neurological: Negative for dizziness, weakness, numbness and headaches.    Objective:  BP 119/80 mmHg  Pulse 77  Temp(Src) 99.2 F (37.3 C)  Resp 18  Wt 162 lb (73.483 kg)  SpO2 96%  Physical Exam  Constitutional: She is oriented to person, place, and time. She appears well-developed and well-nourished. No distress.  HENT:  Head: Normocephalic and atraumatic.  Right Ear: External ear normal.  Left Ear: External ear normal.  Mouth/Throat: No oropharyngeal exudate.  TM's clear bilaterally no retraction or bulging some serous fluid seen bilaterally  Eyes: EOM are normal. Pupils are equal, round, and reactive to light. Right eye exhibits no discharge. Left eye exhibits no discharge. No scleral icterus.  Neck: Normal range of motion. Neck supple.  Cardiovascular: Normal rate, regular rhythm and normal heart sounds.  Exam reveals no gallop and no friction rub.   No murmur heard. Pulmonary/Chest: Effort normal and breath sounds normal. No respiratory distress. She has no wheezes. She has no rales. She exhibits no tenderness.  Lymphadenopathy:    She has no cervical adenopathy.  Neurological: She is alert and oriented to person, place, and time. No cranial nerve deficit. She exhibits normal muscle tone. Coordination normal.  Skin: Skin is warm and dry. No rash noted. She is not diaphoretic.  Psychiatric: She has a normal mood and affect. Her behavior is normal. Judgment and thought content normal.   Assessment & Plan:   [place] was seen today for sore throat.  Diagnoses and all orders for this  visit:  Laryngitis -     Ambulatory referral to ENT  Other orders -     fluticasone (FLONASE) 50 MCG/ACT nasal spray; Place 2 sprays into both nostrils daily.   I have discontinued Ms. Seevers's methylPREDNISolone. I am also having her start on fluticasone. Additionally, I am having her maintain her lovastatin, levothyroxine, clonazePAM, and sertraline.  Meds ordered this encounter  Medications  . fluticasone (FLONASE) 50 MCG/ACT nasal spray    Sig: Place 2 sprays into both nostrils daily.    Dispense:  16 g    Refill:  6    Order Specific Question:  Supervising Provider    Answer:  Crecencio Mc [[id number]]     Follow-up: Return if symptoms worsen or fail to improve.

## 2014-12-06 NOTE — Patient Instructions (Signed)
Try zyrtec (over the counter) in the morning Benadryl at night time (over the counter)  2 sprays of flonase in each nostril daily

## 2014-12-06 NOTE — Progress Notes (Signed)
Pre visit review using our clinic review tool, if applicable. No additional management support is needed unless otherwise documented below in the visit note. 

## 2014-12-14 NOTE — Assessment & Plan Note (Signed)
No sustained improvement since prednisone taper. Asked pt to continue acid reflux medication, start OTC zyrtec, added flonase and sent to pharmacy, benadryl at night for drainage. ENT referral placed in case of no relief. Pt was agreeable to plan. Will follow

## 2014-12-16 ENCOUNTER — Ambulatory Visit: Payer: No Typology Code available for payment source | Admitting: Nurse Practitioner

## 2014-12-19 ENCOUNTER — Encounter: Payer: Self-pay | Admitting: Nurse Practitioner

## 2014-12-19 ENCOUNTER — Ambulatory Visit (INDEPENDENT_AMBULATORY_CARE_PROVIDER_SITE_OTHER): Payer: Medicare Other | Admitting: Nurse Practitioner

## 2014-12-19 VITALS — BP 110/78 | HR 74 | Temp 98.1°F | Resp 12 | Ht 63.0 in | Wt 164.2 lb

## 2014-12-19 DIAGNOSIS — J301 Allergic rhinitis due to pollen: Secondary | ICD-10-CM | POA: Diagnosis not present

## 2014-12-19 DIAGNOSIS — K219 Gastro-esophageal reflux disease without esophagitis: Secondary | ICD-10-CM | POA: Diagnosis not present

## 2014-12-19 DIAGNOSIS — R490 Dysphonia: Secondary | ICD-10-CM | POA: Diagnosis not present

## 2014-12-19 DIAGNOSIS — Z0000 Encounter for general adult medical examination without abnormal findings: Secondary | ICD-10-CM

## 2014-12-19 DIAGNOSIS — Z1231 Encounter for screening mammogram for malignant neoplasm of breast: Secondary | ICD-10-CM | POA: Insufficient documentation

## 2014-12-19 LAB — LIPID PANEL
CHOL/HDL RATIO: 4
Cholesterol: 157 mg/dL (ref 0–200)
HDL: 41.1 mg/dL (ref >39.00)
LDL CALC: 95 mg/dL (ref 0–99)
NONHDL: 116.31
Triglycerides: 108 mg/dL (ref 0.0–149.0)
VLDL: 21.6 mg/dL (ref 0.0–40.0)

## 2014-12-19 LAB — COMPREHENSIVE METABOLIC PANEL
ALBUMIN: 4.3 g/dL (ref 3.5–5.2)
ALK PHOS: 103 U/L (ref 39–117)
ALT: 20 U/L (ref 0–35)
AST: 21 U/L (ref 0–37)
BUN: 17 mg/dL (ref 6–23)
CO2: 26 mEq/L (ref 19–32)
CREATININE: 0.79 mg/dL (ref 0.40–1.20)
Calcium: 9.4 mg/dL (ref 8.4–10.5)
Chloride: 107 mEq/L (ref 96–112)
GFR: 76.54 mL/min (ref >60.00)
Glucose, Bld: 110 mg/dL — ABNORMAL HIGH (ref 70–99)
POTASSIUM: 4 meq/L (ref 3.5–5.1)
SODIUM: 143 meq/L (ref 135–145)
TOTAL PROTEIN: 6.3 g/dL (ref 6.0–8.3)
Total Bilirubin: 0.4 mg/dL (ref 0.2–1.2)

## 2014-12-19 LAB — VITAMIN D 25 HYDROXY (VIT D DEFICIENCY, FRACTURES): VITD: 8.34 ng/mL — ABNORMAL LOW (ref 30.00–100.00)

## 2014-12-19 LAB — HEMOGLOBIN A1C: Hgb A1c MFr Bld: 6.2 % (ref 4.6–6.5)

## 2014-12-19 LAB — TSH: TSH: 7.04 u[IU]/mL — AB (ref 0.35–4.50)

## 2014-12-19 NOTE — Progress Notes (Signed)
Patient ID: Jenny Roberts, female    DOB: [birth year]  Age: 69 y.o. MRN: [id number]  CC: Annual Exam   HPI Jenny Roberts presents for her annual exam.   1) Health Maintenance-   Diet- Was eating out   Exercise- Outside working in the garden, walking    Patient Scale at home 159.7 lbs   Immunizations- Tdap - not UTD, Flu- [facility] in Oct.       Mammogram- 9/16   Pap- Hysterectomy   Bone Density- 5 years ago   Colonoscopy- 2014 good for 10 years   Eye Exam- UTD, glasses  Dental Exam- UTD   Fall- Negative screen  PHQ-2 = 0  History Jenny Roberts has a past medical history of Thyroid disease; Hyperlipidemia; Anxiety; Depression; Breast cancer ([place]) (2001); and Skin cancer (09/2004).   She has past surgical history that includes mole excision (2006); Breast biopsy (Right, 2001); Breast lumpectomy (Right, 2001); Wrist surgery (Right, 2010); Colonoscopy (2008); Knee surgery (2003); Abdominal hysterectomy (1983); and Carpal tunnel release (2016).   Her family history includes Brain cancer in her father; Cancer (age of onset: 68) in her maternal grandfather.She reports that she has never smoked. She has never used smokeless tobacco. She reports that she does not drink alcohol or use illicit drugs.  Outpatient Prescriptions Prior to Visit  Medication Sig Dispense Refill  . clonazePAM (KLONOPIN) 0.5 MG tablet Take 1 tablet (0.5 mg total) by mouth daily. 30 tablet 5  . fluticasone (FLONASE) 50 MCG/ACT nasal spray Place 2 sprays into both nostrils daily. 16 g 6  . levothyroxine (SYNTHROID, LEVOTHROID) 88 MCG tablet Take 1 tablet (88 mcg total) by mouth daily. 30 tablet 5  . lovastatin (MEVACOR) 40 MG tablet Take 1 tablet (40 mg total) by mouth daily. 30 tablet 5  . sertraline (ZOLOFT) 100 MG tablet Take 1 tablet (100 mg total) by mouth daily. 30 tablet 1   No facility-administered medications prior to visit.    ROS Review of Systems  Constitutional: Negative for fever, chills, diaphoresis,  fatigue and unexpected weight change.  HENT: Negative for tinnitus and trouble swallowing.   Gastrointestinal: Negative for nausea, vomiting, abdominal pain, diarrhea, constipation and blood in stool.  Endocrine: Negative for polydipsia, polyphagia and polyuria.  Genitourinary: Negative for dysuria, hematuria, vaginal discharge and vaginal pain.  Musculoskeletal: Negative for myalgias, back pain, arthralgias and gait problem.  Skin: Negative for color change and rash.  Neurological: Negative for dizziness, weakness, numbness and headaches.  Hematological: Does not bruise/bleed easily.  Psychiatric/Behavioral: Negative for suicidal ideas and sleep disturbance. The patient is not nervous/anxious.     Objective:  BP 110/78 mmHg  Pulse 74  Temp(Src) 98.1 F (36.7 C)  Resp 12  Ht 5\' 3"  (1.6 m)  Wt 164 lb 3.2 oz (74.481 kg)  BMI 29.09 kg/m2  SpO2 95%  Physical Exam  Constitutional: She is oriented to person, place, and time. She appears well-developed and well-nourished. No distress.  HENT:  Head: Normocephalic and atraumatic.  Right Ear: External ear normal.  Left Ear: External ear normal.  Nose: Nose normal.  Mouth/Throat: Oropharynx is clear and moist. No oropharyngeal exudate.  TMs and canals clear bilaterally  Eyes: Conjunctivae and EOM are normal. Pupils are equal, round, and reactive to light. Right eye exhibits no discharge. Left eye exhibits no discharge. No scleral icterus.  Neck: Normal range of motion. Neck supple. No thyromegaly present.  Cardiovascular: Normal rate, regular rhythm, normal heart sounds and intact distal pulses.  Exam  reveals no gallop and no friction rub.   No murmur heard. Pulmonary/Chest: Effort normal and breath sounds normal. No respiratory distress. She has no wheezes. She has no rales. She exhibits no tenderness.  Clinical breast exam performed with healed findings from previous biopsies. Right breast is more dense than the left  Abdominal: Soft.  Bowel sounds are normal. She exhibits no distension and no mass. There is no tenderness. There is no rebound and no guarding.  Genitourinary:  Deferred due to lack of symptoms and hysterectomy  Musculoskeletal: Normal range of motion. She exhibits no edema or tenderness.  Lymphadenopathy:    She has no cervical adenopathy.  Neurological: She is alert and oriented to person, place, and time. She has normal reflexes. No cranial nerve deficit. She exhibits normal muscle tone. Coordination normal.  Skin: Skin is warm and dry. No rash noted. She is not diaphoretic. No erythema. No pallor.  Psychiatric: She has a normal mood and affect. Her behavior is normal. Judgment and thought content normal.   Assessment & Plan:   Jenny Roberts was seen today for annual exam.  Diagnoses and all orders for this visit:  Routine general medical examination at a health care facility -     Comprehensive metabolic panel -     Hemoglobin A1c -     Lipid panel -     TSH -     VITAMIN D 25 Hydroxy (Vit-D Deficiency, Fractures)   I am having Jenny Roberts maintain her lovastatin, levothyroxine, clonazePAM, sertraline, and fluticasone.  No orders of the defined types were placed in this encounter.     Follow-up: Return in about 1 year (around 12/19/2015) for CPE with fasting labs.

## 2014-12-19 NOTE — Patient Instructions (Signed)

## 2014-12-19 NOTE — Assessment & Plan Note (Signed)
Discussed acute and chronic issues. Reviewed health maintenance measures, PFSHx, and immunizations. Obtain routine labs TSH, Lipid panel, A1c, Vitamin D and CMET.  Clinical breast exam performed without new findings. Dr. Jamal Collin also does one and mammogram is UTD. Tdap- unsure of last one- had surgery last August and feels it was done then. Will obtain records.

## 2014-12-19 NOTE — Progress Notes (Signed)
Pre visit review using our clinic review tool, if applicable. No additional management support is needed unless otherwise documented below in the visit note. 

## 2014-12-22 ENCOUNTER — Other Ambulatory Visit: Payer: Self-pay | Admitting: Nurse Practitioner

## 2014-12-22 DIAGNOSIS — E039 Hypothyroidism, unspecified: Secondary | ICD-10-CM

## 2014-12-22 DIAGNOSIS — E559 Vitamin D deficiency, unspecified: Secondary | ICD-10-CM

## 2014-12-22 MED ORDER — LEVOTHYROXINE SODIUM 100 MCG PO TABS: 100.0000 ug | ORAL_TABLET | Freq: Every day | ORAL | Status: DC

## 2014-12-22 MED ORDER — VITAMIN D (ERGOCALCIFEROL) 1.25 MG (50000 UNIT) PO CAPS: 50000.0000 [IU] | ORAL_CAPSULE | ORAL | Status: DC

## 2014-12-26 ENCOUNTER — Telehealth: Payer: Self-pay | Admitting: *Deleted

## 2014-12-26 NOTE — Telephone Encounter (Signed)
Left a message for her to return my call.

## 2014-12-26 NOTE — Telephone Encounter (Signed)
Patient requested a call in reference to her lab results from 12/22/14. Please advise

## 2014-12-27 ENCOUNTER — Telehealth: Payer: Self-pay

## 2014-12-27 ENCOUNTER — Other Ambulatory Visit: Payer: Self-pay | Admitting: Nurse Practitioner

## 2014-12-27 NOTE — Telephone Encounter (Signed)
Error

## 2015-01-05 ENCOUNTER — Other Ambulatory Visit: Payer: Self-pay | Admitting: Nurse Practitioner

## 2015-01-06 NOTE — Telephone Encounter (Signed)
Last visit 12/19/2014, Please advise.

## 2015-01-09 ENCOUNTER — Other Ambulatory Visit: Payer: Self-pay | Admitting: Nurse Practitioner

## 2015-02-03 ENCOUNTER — Telehealth: Payer: Self-pay

## 2015-02-03 NOTE — Telephone Encounter (Signed)
Pt called in stating her med Synthroid was changed from 82mcg to 170mcg, and she now having hot flashes and feeling nausea. Wants to know what she should do about her dosage. Please advise./tvw

## 2015-02-06 ENCOUNTER — Other Ambulatory Visit: Payer: Self-pay | Admitting: Nurse Practitioner

## 2015-02-06 MED ORDER — LEVOTHYROXINE SODIUM 88 MCG PO TABS: 88.0000 ug | ORAL_TABLET | Freq: Every day | ORAL | Status: DC

## 2015-02-06 NOTE — Telephone Encounter (Signed)
I want her to go back to 88 mcg- does she need a refill?

## 2015-02-06 NOTE — Telephone Encounter (Signed)
Refilled

## 2015-02-06 NOTE — Telephone Encounter (Addendum)
Called and notified pt of Jenny Roberts message, and yes she does need a refill on Synthroid 88mg 

## 2015-02-09 ENCOUNTER — Other Ambulatory Visit: Payer: Self-pay | Admitting: Nurse Practitioner

## 2015-02-22 ENCOUNTER — Ambulatory Visit: Payer: No Typology Code available for payment source | Admitting: Nurse Practitioner

## 2015-02-23 ENCOUNTER — Ambulatory Visit: Payer: No Typology Code available for payment source | Admitting: Nurse Practitioner

## 2015-03-09 ENCOUNTER — Ambulatory Visit: Payer: No Typology Code available for payment source | Admitting: Nurse Practitioner

## 2015-03-10 ENCOUNTER — Other Ambulatory Visit: Payer: Self-pay | Admitting: Nurse Practitioner

## 2015-03-13 ENCOUNTER — Other Ambulatory Visit: Payer: Self-pay | Admitting: Nurse Practitioner

## 2015-03-13 DIAGNOSIS — Z760 Encounter for issue of repeat prescription: Secondary | ICD-10-CM

## 2015-03-14 NOTE — Telephone Encounter (Signed)
Last OV 12/19/14, Rx last filled on 01/06/15 #30, 0 refills... Okay to refill?

## 2015-03-15 NOTE — Telephone Encounter (Signed)
Rx faxed to pharmacy  

## 2015-05-12 ENCOUNTER — Other Ambulatory Visit: Payer: Self-pay | Admitting: Nurse Practitioner

## 2015-05-23 ENCOUNTER — Other Ambulatory Visit: Payer: Self-pay | Admitting: Nurse Practitioner

## 2015-06-16 ENCOUNTER — Other Ambulatory Visit: Payer: Self-pay | Admitting: Nurse Practitioner

## 2015-06-16 MED ORDER — CLONAZEPAM 0.5 MG PO TABS: 0.5000 mg | ORAL_TABLET | Freq: Every day | ORAL | Status: DC

## 2015-06-16 NOTE — Telephone Encounter (Signed)
Was a NP patient, was seen last November 2016, has follow up with you in November 2017, please advise refill?

## 2015-06-16 NOTE — Telephone Encounter (Signed)
Ok. Pt was scheduled for 07/05/15 @4pm .

## 2015-06-16 NOTE — Telephone Encounter (Signed)
We will give one month refill. Patient needs to be seen for future refills as this issue did not appear to be discussed at her office visit in November 2016. Thanks.

## 2015-06-16 NOTE — Telephone Encounter (Signed)
Can you please schedule a visit in the next month as he only refilled for one month.  thanks

## 2015-06-16 NOTE — Telephone Encounter (Signed)
Pt called about needing a refill for clonazePAM (KLONOPIN) 0.5 MG tablet. Pt has not seen Dr Caryl Bis yet, pt is scheduled.  Pharmacy is [facility] [place], [place].   Call pt @ [phone]. Thank you!

## 2015-06-30 ENCOUNTER — Other Ambulatory Visit: Payer: Self-pay | Admitting: *Deleted

## 2015-06-30 MED ORDER — LOVASTATIN 40 MG PO TABS: 40.0000 mg | ORAL_TABLET | Freq: Every day | ORAL | Status: DC

## 2015-06-30 NOTE — Telephone Encounter (Signed)
Patient is aware that she must keep her appointment for further refills.

## 2015-07-05 ENCOUNTER — Encounter: Payer: Self-pay | Admitting: Family Medicine

## 2015-07-05 ENCOUNTER — Ambulatory Visit (INDEPENDENT_AMBULATORY_CARE_PROVIDER_SITE_OTHER): Payer: Medicare Other | Admitting: Family Medicine

## 2015-07-05 VITALS — BP 124/76 | HR 78 | Temp 97.8°F | Ht 63.0 in | Wt 157.6 lb

## 2015-07-05 DIAGNOSIS — R946 Abnormal results of thyroid function studies: Secondary | ICD-10-CM | POA: Insufficient documentation

## 2015-07-05 DIAGNOSIS — E039 Hypothyroidism, unspecified: Secondary | ICD-10-CM | POA: Diagnosis not present

## 2015-07-05 DIAGNOSIS — F411 Generalized anxiety disorder: Secondary | ICD-10-CM | POA: Diagnosis not present

## 2015-07-05 DIAGNOSIS — E559 Vitamin D deficiency, unspecified: Secondary | ICD-10-CM | POA: Insufficient documentation

## 2015-07-05 MED ORDER — CLONAZEPAM 0.5 MG PO TABS: 0.5000 mg | ORAL_TABLET | Freq: Every day | ORAL | Status: DC

## 2015-07-05 MED ORDER — LEVOTHYROXINE SODIUM 88 MCG PO TABS: 88.0000 ug | ORAL_TABLET | Freq: Every day | ORAL | Status: DC

## 2015-07-05 NOTE — Assessment & Plan Note (Signed)
Asymptomatic. Check TSH today. Refill Synthroid.

## 2015-07-05 NOTE — Assessment & Plan Note (Signed)
Well-controlled. Continue Klonopin and Zoloft.

## 2015-07-05 NOTE — Patient Instructions (Signed)
Nice to meet you. I have refilled your medications. We will check lab work today and call with the results. We are going to order an ultrasound of your thyroid.

## 2015-07-05 NOTE — Progress Notes (Signed)
Patient ID: Jenny Roberts, female   DOB: 1945-06-11, 70 y.o.   MRN: [id number]  Jenny Rumps, MD Phone: [phone]  Jenny Roberts is a 70 y.o. female who presents today for follow-up.  Anxiety: She notes this is well controlled. Take Zoloft. Klonopin once daily mostly at night. States this keeps her even keel. No depression.  GAD 7 : Generalized Anxiety Score 07/05/2015  Nervous, Anxious, on Edge 1  Control/stop worrying 1  Worry too much - different things 2  Trouble relaxing 1  Restless 0  Easily annoyed or irritable 1  Afraid - awful might happen 2  Total GAD 7 Score 8  Anxiety Difficulty Somewhat difficult    HYPOTHYROIDISM Disease Monitoring Weight changes: Some weight loss, has been trying  Skin Changes: No Palpitations: No Heat/Cold intolerance: No  Medication Monitoring Compliance:  Taking Synthroid   Last TSH:   Lab Results  Component Value Date   TSH 7.04* 12/19/2014   Vitamin D deficiency: Patient was unable to tolerate the 50,000 units of vitamin D. Notes this upset her stomach. No issues with not taking this medication. Does not she spends more time outside during the summer.  PMH: nonsmoker   ROS see history of present illness  Objective  Physical Exam Filed Vitals:   07/05/15 1553  BP: 124/76  Pulse: 78  Temp: 97.8 F (36.6 C)    BP Readings from Last 3 Encounters:  07/05/15 124/76  12/19/14 110/78  12/06/14 119/80   Wt Readings from Last 3 Encounters:  07/05/15 157 lb 9.6 oz (71.487 kg)  12/19/14 164 lb 3.2 oz (74.481 kg)  12/06/14 162 lb (73.483 kg)    Physical Exam  Constitutional: She is well-developed, well-nourished, and in no distress.  HENT:  Head: Normocephalic and atraumatic.  Right Ear: External ear normal.  Left Ear: External ear normal.  Eyes: Conjunctivae are normal. Pupils are equal, round, and reactive to light.  Neck: Neck supple. No thyromegaly present.  Possible nodule noted in left lobe of thyroid on exam    Cardiovascular: Normal rate, regular rhythm and normal heart sounds.   Pulmonary/Chest: Effort normal and breath sounds normal.  Lymphadenopathy:    She has no cervical adenopathy.  Neurological: She is alert. Gait normal.  Skin: Skin is warm and dry. She is not diaphoretic.  Psychiatric: Mood and affect normal.     Assessment/Plan: Please see individual problem list.  Abnormal thyroid exam Possible thyroid nodule in left lobe on exam. We'll order an ultrasound to evaluate this further.  Anxiety state Well-controlled. Continue Klonopin and Zoloft.  Hypothyroidism Asymptomatic. Check TSH today. Refill Synthroid.  Vitamin D deficiency Recheck vitamin D today. If still low with likely treat with over-the-counter supplementation as she was unable to tolerate prescription supplementation.    Orders Placed This Encounter  Procedures  . US Soft Tissue Head/Neck    Standing Status: Future     Number of Occurrences:      Standing Expiration Date: 09/03/2016    Order Specific Question:  Reason for Exam (SYMPTOM  OR DIAGNOSIS REQUIRED)    Answer:  possible thyroid nodule palpated in left thyroid lobe, history hypothyroidism    Order Specific Question:  Preferred imaging location?    Answer:  [facility]  . TSH  . Vitamin D (25 hydroxy)    Meds ordered this encounter  Medications  . clonazePAM (KLONOPIN) 0.5 MG tablet    Sig: Take 1 tablet (0.5 mg total) by mouth daily.  Dispense:  90 tablet    Refill:  0  . levothyroxine (SYNTHROID, LEVOTHROID) 88 MCG tablet    Sig: Take 1 tablet (88 mcg total) by mouth daily.    Dispense:  90 tablet    Refill:  [place], MD [facility]

## 2015-07-05 NOTE — Progress Notes (Signed)
Pre visit review using our clinic review tool, if applicable. No additional management support is needed unless otherwise documented below in the visit note. 

## 2015-07-05 NOTE — Assessment & Plan Note (Addendum)
Recheck vitamin D today. If still low with likely treat with over-the-counter supplementation as she was unable to tolerate prescription supplementation.

## 2015-07-05 NOTE — Assessment & Plan Note (Signed)
Possible thyroid nodule in left lobe on exam. We'll order an ultrasound to evaluate this further.

## 2015-07-06 ENCOUNTER — Other Ambulatory Visit: Payer: Self-pay | Admitting: Family Medicine

## 2015-07-06 LAB — VITAMIN D 25 HYDROXY (VIT D DEFICIENCY, FRACTURES): VITD: 18.97 ng/mL — ABNORMAL LOW (ref 30.00–100.00)

## 2015-07-06 LAB — TSH: TSH: 8.86 u[IU]/mL — ABNORMAL HIGH (ref 0.35–4.50)

## 2015-07-06 MED ORDER — LEVOTHYROXINE SODIUM 100 MCG PO TABS: 100.0000 ug | ORAL_TABLET | Freq: Every day | ORAL | Status: DC

## 2015-07-10 ENCOUNTER — Ambulatory Visit (INDEPENDENT_AMBULATORY_CARE_PROVIDER_SITE_OTHER): Payer: Medicare Other

## 2015-07-10 VITALS — BP 118/78 | HR 79 | Temp 98.1°F | Resp 12 | Ht 61.0 in | Wt 157.8 lb

## 2015-07-10 DIAGNOSIS — Z23 Encounter for immunization: Secondary | ICD-10-CM

## 2015-07-10 DIAGNOSIS — Z0000 Encounter for general adult medical examination without abnormal findings: Secondary | ICD-10-CM

## 2015-07-10 NOTE — Progress Notes (Signed)
Subjective:   Jenny Roberts is a 70 y.o. female who presents for an Initial Medicare Annual Wellness Visit.  Review of Systems    No ROS.  Medicare Wellness Visit.  Cardiac Risk Factors include: advanced age (>79men, >53 women)     Objective:    Today's Vitals   07/10/15 1517  BP: 118/78  Pulse: 79  Temp: 98.1 F (36.7 C)  TempSrc: Oral  Resp: 12  Height: 5\' 1"  (1.549 m)  Weight: 157 lb 12.8 oz (71.578 kg)  SpO2: 96%   Body mass index is 29.83 kg/(m^2).   Current Medications (verified) Outpatient Encounter Prescriptions as of 07/10/2015  Medication Sig  . clonazePAM (KLONOPIN) 0.5 MG tablet Take 1 tablet (0.5 mg total) by mouth daily.  . fluticasone (FLONASE) 50 MCG/ACT nasal spray Place 2 sprays into both nostrils daily.  Marland Kitchen levothyroxine (SYNTHROID, LEVOTHROID) 100 MCG tablet Take 1 tablet (100 mcg total) by mouth daily.  Marland Kitchen lovastatin (MEVACOR) 40 MG tablet Take 1 tablet (40 mg total) by mouth daily.  . sertraline (ZOLOFT) 100 MG tablet TAKE ONE TABLET BY MOUTH ONCE DAILY  . [DISCONTINUED] Vitamin D, Ergocalciferol, (DRISDOL) 50000 UNITS CAPS capsule Take 1 capsule (50,000 Units total) by mouth every 7 (seven) days.   No facility-administered encounter medications on file as of 07/10/2015.    Allergies (verified) Amoxicillin-pot clavulanate; Codeine; and Latex   History: Past Medical History  Diagnosis Date  . Thyroid disease   . Hyperlipidemia   . Anxiety   . Depression   . Breast cancer ([place]) 2001    Right. Treated with lumpectomy followed by Chemotherapy and Radiation therapy  . Skin cancer 09/2004    treated at [facility]   Past Surgical History  Procedure Laterality Date  . Mole excision  2006    Basal cell   . Breast biopsy Right 2001  . Breast lumpectomy Right 2001  . Wrist surgery Right 2010  . Colonoscopy  2008  . Knee surgery  2003  . Abdominal hysterectomy  1983    Left parts of each ovary   . Carpal tunnel release  2016   Family History    Problem Relation Age of Onset  . Brain cancer Father   . Cancer Maternal Grandfather 17    Colon Cancer   Social History   Occupational History  . Not on file.   Social History Main Topics  . Smoking status: Never Smoker   . Smokeless tobacco: Never Used  . Alcohol Use: No  . Drug Use: No  . Sexual Activity: Not Currently    Tobacco Counseling Counseling given: Not Answered   Activities of Daily Living In your present state of health, do you have any difficulty performing the following activities: 07/10/2015  Hearing? N  Vision? N  Difficulty concentrating or making decisions? N  Walking or climbing stairs? N  Dressing or bathing? N  Doing errands, shopping? N  Preparing Food and eating ? N  Using the Toilet? N  In the past six months, have you accidently leaked urine? N  Do you have problems with loss of bowel control? N  Managing your Medications? N  Managing your Finances? N  Housekeeping or managing your Housekeeping? N    Immunizations and Health Maintenance Immunization History  Administered Date(s) Administered  . Influenza-Unspecified 11/07/2014  . Pneumococcal Conjugate-13 07/10/2015   Health Maintenance Due  Topic Date Due  . Hepatitis C Screening  May 09, 1945  . TETANUS/TDAP  04/08/1964  . ZOSTAVAX  04/08/2005    Patient Care Team: Leone Haven, MD as PCP - General (Family Medicine) Seeplaputhur Robinette Haines, MD (General Surgery)  Indicate any recent Medical Services you may have received from other than [facility] providers in the past year (date may be approximate).     Assessment:   This is a routine wellness examination for Jenny Roberts.  The goal of the wellness visit is to assist the patient how to close the gaps in care and create a preventative care plan for the patient.   Taking VIT D as appropriate/Osteoporosis risk reviewed.    Medications reviewed; taking without issues or barriers.  Safety issues reviewed; smoke and carbon monoxide  detectors in the home. Firearms locked in a safe in the home. Wears seatbelts when driving or riding with others. No violence in the home.  No identified risk were noted; The patient was oriented x 3; appropriate in dress and manner and no objective failures at ADL's or IADL's.   Pre-obese; discussed the importance of exercise, water intake and healthy diet. Educational material provided.  Prevnar 13 vaccine administered, L deltoid. Tolerated well.  Educational material provided.  ZOSTER and TDAP vaccine postponed for follow up with insurance.    Patient Concerns: Released lab results for VIT D; made her aware of results and dosage changes, per PCPs note.  Verbalized understanding.  Requested Hepatitis C Screening at scheduled follow up visit.  Deferred to PCP for follow up.  Hearing/Vision screen Hearing Screening Comments: Passed the whisper test. Vision Screening Comments: Followed by Dr. Gloriann Loan Annual visits Wears glasses   Dietary issues and exercise activities discussed: Current Exercise Habits: Home exercise routine, Type of exercise: walking, Frequency (Times/Week): 3, Intensity: Mild  Goals    . Increase physical activity     Increase intensity of walk when walking 10 minutes to and from (totalling 20 minutes) her brothers house 3 times weekly, as tolerated.      Depression Screen PHQ 2/9 Scores 07/10/2015 12/19/2014 06/15/2014  PHQ - 2 Score 0 0 0    Fall Risk Fall Risk  07/10/2015 12/19/2014 06/15/2014  Falls in the past year? No No No    Cognitive Function: MMSE - Mini Mental State Exam 07/10/2015  Orientation to time 5  Orientation to Place 5  Registration 3  Attention/ Calculation 5  Recall 3  Language- name 2 objects 2  Language- repeat 1  Language- follow 3 step command 3  Language- read & follow direction 1  Write a sentence 1  Copy design 1  Total score 30    Screening Tests Health Maintenance  Topic Date Due  . Hepatitis C Screening  02-01-1945   . TETANUS/TDAP  04/08/1964  . ZOSTAVAX  04/08/2005  . INFLUENZA VACCINE  11/22/2015 (Originally 08/22/2015)  . PNA vac Low Risk Adult (2 of 2 - PPSV23) 07/09/2016  . MAMMOGRAM  10/02/2016  . COLONOSCOPY  08/07/2024  . DEXA SCAN  Completed      Plan:   End of life planning; Advance aging; Advanced directives discussed. Completion of HCPOA/Living Will in progress with family. Follow up with PCP when finalized.  During the course of the visit, Miquela was educated and counseled about the following appropriate screening and preventive services:   Vaccines to include Pneumoccal, Influenza, Hepatitis B, Td, Zostavax, HCV  Electrocardiogram  Cardiovascular disease screening  Colorectal cancer screening  Bone density screening  Diabetes screening  Glaucoma screening  Mammography/PAP  Nutrition counseling  Smoking cessation counseling  Patient Instructions (the  written plan) were given to the patient.    Varney Biles, LPN   [id number]

## 2015-07-10 NOTE — Patient Instructions (Addendum)
  Ms. Gabrielson , Thank you for taking time to come for your Medicare Wellness Visit. I appreciate your ongoing commitment to your health goals. Please review the following plan we discussed and let me know if I can assist you in the future.   Follow up Dr. Caryl Bis as needed.  HEPATITIS C SCREENING AT FOLLOW UP.  CHECK WITH YOUR INSURANCE AND PHARMACY REGARDING THE SHINGLES AND TDAP VACCINE.   PICK UP NEW Rx FOR LEVOTHYROXINE 100MCG AND VIT D 2000IUs, PER PHYSICIAN.   This is a list of the screening recommended for you and due dates:  Health Maintenance  Topic Date Due  .  Hepatitis C: One time screening is recommended by Center for Disease Control  (CDC) for  adults born from [birth year] through 1965.   Jan 25, 1945  . Tetanus Vaccine  04/08/1964  . Shingles Vaccine  04/08/2005  . Flu Shot  11/22/2015*  . Pneumonia vaccines (2 of 2 - PPSV23) 07/09/2016  . Mammogram  10/02/2016  . Colon Cancer Screening  08/07/2024  . DEXA scan (bone density measurement)  Completed  *Topic was postponed. The date shown is not the original due date.

## 2015-07-12 ENCOUNTER — Ambulatory Visit
Admission: RE | Admit: 2015-07-12 | Discharge: 2015-07-12 | Disposition: A | Discharge status: Home / Self Care | Payer: Medicare Other | Source: Ambulatory Visit | Attending: Family Medicine | Admitting: Family Medicine

## 2015-07-12 DIAGNOSIS — R946 Abnormal results of thyroid function studies: Secondary | ICD-10-CM | POA: Diagnosis not present

## 2015-07-12 DIAGNOSIS — E034 Atrophy of thyroid (acquired): Secondary | ICD-10-CM | POA: Insufficient documentation

## 2015-07-18 ENCOUNTER — Telehealth: Payer: Self-pay | Admitting: Family Medicine

## 2015-07-18 NOTE — Telephone Encounter (Signed)
Pt returned phone call from our office. She thinks it is for her test results on her thyroid.

## 2015-07-18 NOTE — Telephone Encounter (Signed)
Spoke with the patient, gave results.

## 2015-07-25 NOTE — Progress Notes (Signed)
I have reviewed the above note and agree.  Eric Sonnenberg, M.D.  

## 2015-08-30 ENCOUNTER — Other Ambulatory Visit: Payer: Self-pay | Admitting: General Surgery

## 2015-08-30 DIAGNOSIS — Z853 Personal history of malignant neoplasm of breast: Secondary | ICD-10-CM

## 2015-09-04 ENCOUNTER — Inpatient Hospital Stay
Admission: RE | Admit: 2015-09-04 | Discharge: 2015-09-04 | Disposition: A | Discharge status: Home / Self Care | Payer: Self-pay | Source: Ambulatory Visit | Attending: *Deleted | Admitting: *Deleted

## 2015-09-04 ENCOUNTER — Other Ambulatory Visit: Payer: Self-pay | Admitting: *Deleted

## 2015-09-04 DIAGNOSIS — Z9289 Personal history of other medical treatment: Secondary | ICD-10-CM

## 2015-09-05 ENCOUNTER — Other Ambulatory Visit: Payer: Self-pay | Admitting: General Surgery

## 2015-09-05 DIAGNOSIS — Z1231 Encounter for screening mammogram for malignant neoplasm of breast: Secondary | ICD-10-CM

## 2015-09-06 ENCOUNTER — Encounter: Payer: Self-pay | Admitting: *Deleted

## 2015-09-11 ENCOUNTER — Other Ambulatory Visit: Payer: Self-pay | Admitting: Family Medicine

## 2015-09-17 ENCOUNTER — Inpatient Hospital Stay
Admission: EM | Admit: 2015-09-17 | Discharge: 2015-09-20 | DRG: 872 | Disposition: A | Discharge status: Home / Self Care | Payer: Medicare Other | Attending: Internal Medicine | Admitting: Internal Medicine

## 2015-09-17 ENCOUNTER — Encounter: Payer: Self-pay | Admitting: Emergency Medicine

## 2015-09-17 DIAGNOSIS — E876 Hypokalemia: Secondary | ICD-10-CM | POA: Diagnosis present

## 2015-09-17 DIAGNOSIS — Z800 Family history of malignant neoplasm of digestive organs: Secondary | ICD-10-CM | POA: Diagnosis not present

## 2015-09-17 DIAGNOSIS — Z808 Family history of malignant neoplasm of other organs or systems: Secondary | ICD-10-CM | POA: Diagnosis not present

## 2015-09-17 DIAGNOSIS — E785 Hyperlipidemia, unspecified: Secondary | ICD-10-CM | POA: Diagnosis present

## 2015-09-17 DIAGNOSIS — Z79899 Other long term (current) drug therapy: Secondary | ICD-10-CM

## 2015-09-17 DIAGNOSIS — Z7951 Long term (current) use of inhaled steroids: Secondary | ICD-10-CM | POA: Diagnosis not present

## 2015-09-17 DIAGNOSIS — F411 Generalized anxiety disorder: Secondary | ICD-10-CM | POA: Diagnosis present

## 2015-09-17 DIAGNOSIS — N390 Urinary tract infection, site not specified: Secondary | ICD-10-CM | POA: Diagnosis present

## 2015-09-17 DIAGNOSIS — Z880 Allergy status to penicillin: Secondary | ICD-10-CM

## 2015-09-17 DIAGNOSIS — Z90710 Acquired absence of both cervix and uterus: Secondary | ICD-10-CM | POA: Diagnosis not present

## 2015-09-17 DIAGNOSIS — A419 Sepsis, unspecified organism: Principal | ICD-10-CM | POA: Diagnosis present

## 2015-09-17 DIAGNOSIS — Z9221 Personal history of antineoplastic chemotherapy: Secondary | ICD-10-CM

## 2015-09-17 DIAGNOSIS — Z85828 Personal history of other malignant neoplasm of skin: Secondary | ICD-10-CM

## 2015-09-17 DIAGNOSIS — Z9889 Other specified postprocedural states: Secondary | ICD-10-CM

## 2015-09-17 DIAGNOSIS — Z923 Personal history of irradiation: Secondary | ICD-10-CM | POA: Diagnosis not present

## 2015-09-17 DIAGNOSIS — Z853 Personal history of malignant neoplasm of breast: Secondary | ICD-10-CM | POA: Diagnosis not present

## 2015-09-17 DIAGNOSIS — F419 Anxiety disorder, unspecified: Secondary | ICD-10-CM | POA: Diagnosis not present

## 2015-09-17 DIAGNOSIS — K529 Noninfective gastroenteritis and colitis, unspecified: Secondary | ICD-10-CM

## 2015-09-17 DIAGNOSIS — E039 Hypothyroidism, unspecified: Secondary | ICD-10-CM | POA: Diagnosis present

## 2015-09-17 DIAGNOSIS — R000 Tachycardia, unspecified: Secondary | ICD-10-CM | POA: Diagnosis not present

## 2015-09-17 LAB — COMPREHENSIVE METABOLIC PANEL
ALK PHOS: 86 U/L (ref 38–126)
ALT: 24 U/L (ref 14–54)
ANION GAP: 8 (ref 5–15)
AST: 24 U/L (ref 15–41)
Albumin: 4.5 g/dL (ref 3.5–5.0)
BUN: 16 mg/dL (ref 6–20)
CO2: 26 mmol/L (ref 22–32)
CREATININE: 0.76 mg/dL (ref 0.44–1.00)
Calcium: 9.5 mg/dL (ref 8.9–10.3)
Chloride: 104 mmol/L (ref 101–111)
Glucose, Bld: 130 mg/dL — ABNORMAL HIGH (ref 65–99)
Potassium: 3.9 mmol/L (ref 3.5–5.1)
Sodium: 138 mmol/L (ref 135–145)
TOTAL PROTEIN: 7.3 g/dL (ref 6.5–8.1)
Total Bilirubin: 0.8 mg/dL (ref 0.3–1.2)

## 2015-09-17 LAB — CBC
HCT: 42.1 % (ref 35.0–47.0)
HEMOGLOBIN: 14.6 g/dL (ref 12.0–16.0)
MCH: 30.1 pg (ref 26.0–34.0)
MCHC: 34.7 g/dL (ref 32.0–36.0)
MCV: 86.7 fL (ref 80.0–100.0)
PLATELETS: 143 10*3/uL — AB (ref 150–440)
RBC: 4.86 MIL/uL (ref 3.80–5.20)
RDW: 13.1 % (ref 11.5–14.5)
WBC: 13.3 10*3/uL — AB (ref 3.6–11.0)

## 2015-09-17 LAB — URINALYSIS COMPLETE WITH MICROSCOPIC (ARMC ONLY)
Bacteria, UA: NONE SEEN
Bilirubin Urine: NEGATIVE
Glucose, UA: NEGATIVE mg/dL
KETONES UR: NEGATIVE mg/dL
NITRITE: NEGATIVE
PH: 5 (ref 5.0–8.0)
PROTEIN: NEGATIVE mg/dL
SPECIFIC GRAVITY, URINE: 1.016 (ref 1.005–1.030)

## 2015-09-17 LAB — LACTIC ACID, PLASMA: Lactic Acid, Venous: 1.4 mmol/L (ref 0.5–1.9)

## 2015-09-17 LAB — LIPASE, BLOOD: Lipase: 18 U/L (ref 11–51)

## 2015-09-17 MED ORDER — ONDANSETRON 4 MG PO TBDP: 4.0000 mg | ORAL_TABLET | Freq: Three times a day (TID) | ORAL | 0 refills | Status: DC | PRN

## 2015-09-17 MED ORDER — METOCLOPRAMIDE HCL 5 MG/ML IJ SOLN
INTRAMUSCULAR | Status: AC
  Filled 2015-09-17: qty 2

## 2015-09-17 MED ORDER — NITROFURANTOIN MONOHYD MACRO 100 MG PO CAPS: 100.0000 mg | ORAL_CAPSULE | Freq: Two times a day (BID) | ORAL | 0 refills | Status: DC

## 2015-09-17 MED ORDER — SODIUM CHLORIDE 0.9 % IV SOLN
Freq: Once | INTRAVENOUS | Status: AC
  Administered 2015-09-17: 22:00:00 via INTRAVENOUS

## 2015-09-17 MED ORDER — METOCLOPRAMIDE HCL 5 MG/ML IJ SOLN
10.0000 mg | Freq: Once | INTRAMUSCULAR | Status: AC
  Administered 2015-09-17: 10 mg via INTRAVENOUS

## 2015-09-17 MED ORDER — LOPERAMIDE HCL 2 MG PO CAPS
4.0000 mg | ORAL_CAPSULE | Freq: Once | ORAL | Status: AC
  Administered 2015-09-17: 4 mg via ORAL
  Filled 2015-09-17: qty 2

## 2015-09-17 MED ORDER — DEXTROSE 5 % IV SOLN
1.0000 g | Freq: Once | INTRAVENOUS | Status: AC
  Administered 2015-09-17: 1 g via INTRAVENOUS
  Filled 2015-09-17: qty 10

## 2015-09-17 MED ORDER — ONDANSETRON HCL 4 MG/2ML IJ SOLN
4.0000 mg | Freq: Once | INTRAMUSCULAR | Status: DC
  Filled 2015-09-17: qty 2

## 2015-09-17 MED ORDER — FAMOTIDINE 20 MG PO TABS: 20.0000 mg | ORAL_TABLET | Freq: Two times a day (BID) | ORAL | 1 refills | Status: DC

## 2015-09-17 MED ORDER — ONDANSETRON HCL 4 MG/2ML IJ SOLN
4.0000 mg | Freq: Once | INTRAMUSCULAR | Status: AC
  Administered 2015-09-17: 4 mg via INTRAVENOUS
  Filled 2015-09-17: qty 2

## 2015-09-17 MED ORDER — FAMOTIDINE IN NACL 20-0.9 MG/50ML-% IV SOLN
20.0000 mg | Freq: Once | INTRAVENOUS | Status: AC
Start: 2015-09-17 — End: 2015-09-17
  Administered 2015-09-17: 20 mg via INTRAVENOUS
  Filled 2015-09-17: qty 50

## 2015-09-17 MED ORDER — SODIUM CHLORIDE 0.9 % IV SOLN
Freq: Once | INTRAVENOUS | Status: AC
  Administered 2015-09-17: 20:00:00 via INTRAVENOUS

## 2015-09-17 MED ORDER — ONDANSETRON 4 MG PO TBDP
4.0000 mg | ORAL_TABLET | Freq: Once | ORAL | Status: AC | PRN
  Administered 2015-09-17: 4 mg via ORAL

## 2015-09-17 NOTE — ED Provider Notes (Addendum)
[facility] Emergency Department Provider Note        Time seen: ----------------------------------------- 7:29 PM on 09/17/2015 -----------------------------------------    I have reviewed the triage vital signs and the nursing notes.   HISTORY  Chief Complaint Emesis and Nausea    HPI Jenny Roberts is a 70 y.o. female who presents the ER for nausea, vomiting and diarrhea that started around 4 AM. Patient states she's not been able to tolerate anything by mouth. She's had initially more episodes of vomiting and then subsequently developed diarrhea. She denies any pain, just feels weak. She has not had a history of this, no recent sick contacts. She denies fevers or other complaints.   Past Medical History:  Diagnosis Date  . Anxiety   . Breast cancer ([place]) 2001   Right. Treated with lumpectomy followed by Chemotherapy and Radiation therapy  . Depression   . Hyperlipidemia   . Skin cancer 09/2004   treated at [facility]  . Thyroid disease     Patient Active Problem List   Diagnosis Date Noted  . Abnormal thyroid exam 07/05/2015  . Hypothyroidism 07/05/2015  . Vitamin D deficiency 07/05/2015  . Routine general medical examination at a health care facility 12/19/2014  . Laryngitis 11/16/2014  . History of breast cancer in female 10/10/2014  . Encounter to establish care 06/15/2014  . Anxiety state 06/15/2014  . Hyperlipidemia 06/15/2014  . Nontraumatic rupture of tendon of thumb 06/15/2014  . Personal history of malignant neoplasm of breast 09/29/2012    Past Surgical History:  Procedure Laterality Date  . ABDOMINAL HYSTERECTOMY  1983   Left parts of each ovary   . BREAST BIOPSY Right 2001  . BREAST LUMPECTOMY Right 2001  . CARPAL TUNNEL RELEASE  2016  . COLONOSCOPY  2008  . KNEE SURGERY  2003  . mole excision  2006   Basal cell   . WRIST SURGERY Right 2010    Allergies Amoxicillin-pot clavulanate; Codeine; and Latex  Social  History Social History  Substance Use Topics  . Smoking status: Never Smoker  . Smokeless tobacco: Never Used  . Alcohol use No    Review of Systems Constitutional: Negative for fever. Cardiovascular: Negative for chest pain. Respiratory: Negative for shortness of breath. Gastrointestinal: Negative for abdominal pain, Positive for vomiting and diarrhea Genitourinary: Negative for dysuria. Musculoskeletal: Negative for back pain. Skin: Negative for rash. Neurological: Negative for headaches, positive for weakness  10-point ROS otherwise negative.  ____________________________________________   PHYSICAL EXAM:  VITAL SIGNS: ED Triage Vitals  Enc Vitals Group     BP 09/17/15 1643 114/66     Pulse Rate 09/17/15 1643 (!) 114     Resp 09/17/15 1643 16     Temp 09/17/15 1643 98.2 F (36.8 C)     Temp Source 09/17/15 1643 Oral     SpO2 09/17/15 1643 95 %     Weight 09/17/15 1643 140 lb (63.5 kg)     Height 09/17/15 1643 5\' 3"  (1.6 m)     Head Circumference --      Peak Flow --      Pain Score 09/17/15 1644 0     Pain Loc --      Pain Edu? --      Excl. in [place]? --     Constitutional: Alert and oriented. Mild distress Eyes: Conjunctivae are normal. PERRL. Normal extraocular movements. ENT   Head: Normocephalic and atraumatic.   Nose: No congestion/rhinnorhea.   Mouth/Throat: Mucous  membranes are moist.   Neck: No stridor. Cardiovascular: Normal rate, regular rhythm. No murmurs, rubs, or gallops. Respiratory: Normal respiratory effort without tachypnea nor retractions. Breath sounds are clear and equal bilaterally. No wheezes/rales/rhonchi. Gastrointestinal: Soft and nontender. Normal bowel sounds Musculoskeletal: Nontender with normal range of motion in all extremities. No lower extremity tenderness nor edema. Neurologic:  Normal speech and language. No gross focal neurologic deficits are appreciated.  Skin:  Skin is warm, dry and intact. No rash  noted. Psychiatric: Mood and affect are normal. Speech and behavior are normal.  ____________________________________________  EKG: Interpreted by me. Sinus tachycardia with a rate of 114 bpm, normal PR interval, normal QRS size, normal QT interval. Left anterior fascicular block. Possible inferior infarct age indeterminate.  ____________________________________________  ED COURSE:  Pertinent labs & imaging results that were available during my care of the patient were reviewed by me and considered in my medical decision making (see chart for details). Clinical Course  Patient presents with symptoms of Norovirus infection. She'll receive IV fluids and antiemetics.  Procedures ____________________________________________   LABS (pertinent positives/negatives)  Labs Reviewed  COMPREHENSIVE METABOLIC PANEL - Abnormal; Notable for the following:       Result Value   Glucose, Bld 130 (*)    All other components within normal limits  CBC - Abnormal; Notable for the following:    WBC 13.3 (*)    Platelets 143 (*)    All other components within normal limits  URINALYSIS COMPLETEWITH MICROSCOPIC ([facility] ONLY) - Abnormal; Notable for the following:    Color, Urine YELLOW (*)    APPearance CLEAR (*)    Hgb urine dipstick 1+ (*)    Leukocytes, UA TRACE (*)    Squamous Epithelial / LPF 0-5 (*)    All other components within normal limits  LIPASE, BLOOD  LACTIC ACID, PLASMA  LACTIC ACID, PLASMA   ____________________________________________  FINAL ASSESSMENT AND PLAN  Gastroenteritis  Plan: Patient with labs as dictated above. She has received a saline infusion as well as IV Zofran and oral Imodium. She did require an additional dose of Pepcid and Reglan. Patient suddenly spiked a temperature, required additional fluids and we checked a lactate which was normal. Patient states she does not feel well enough to go home. I will recommend observation.   Earleen Newport,  MD   Note: This dictation was prepared with Dragon dictation. Any transcriptional errors that result from this process are unintentional    Earleen Newport, MD 09/17/15 [id number]    Earleen Newport, MD 09/17/15 [phone]

## 2015-09-17 NOTE — H&P (Signed)
[facility] [place] NAME: Jenny Roberts    MR#:  [id number]  DATE OF BIRTH:  1945/11/02  DATE OF ADMISSION:  09/17/2015  PRIMARY CARE PHYSICIAN: [name], MD   REQUESTING/REFERRING PHYSICIAN: Jimmye Norman, MD  CHIEF COMPLAINT:   Chief Complaint  Patient presents with  . Emesis  . Nausea    HISTORY OF PRESENT ILLNESS:  Jenny Roberts  is a 70 y.o. female who presents with 2 days of nausea and diarrhea and dysuria. Patient presents to the ED febrile, tachycardic, and An Elevated White Count along with a UA That Is Suspicious for UTI. Hospitals Were Called for Admission  PAST MEDICAL HISTORY:   Past Medical History:  Diagnosis Date  . Anxiety   . Breast cancer ([place]) 2001   Right. Treated with lumpectomy followed by Chemotherapy and Radiation therapy  . Depression   . Hyperlipidemia   . Skin cancer 09/2004   treated at [facility]  . Thyroid disease     PAST SURGICAL HISTORY:   Past Surgical History:  Procedure Laterality Date  . ABDOMINAL HYSTERECTOMY  1983   Left parts of each ovary   . BREAST BIOPSY Right 2001  . BREAST LUMPECTOMY Right 2001  . CARPAL TUNNEL RELEASE  2016  . COLONOSCOPY  2008  . KNEE SURGERY  2003  . mole excision  2006   Basal cell   . WRIST SURGERY Right 2010    SOCIAL HISTORY:   Social History  Substance Use Topics  . Smoking status: Never Smoker  . Smokeless tobacco: Never Used  . Alcohol use No    FAMILY HISTORY:   Family History  Problem Relation Age of Onset  . Brain cancer Father   . Cancer Maternal Grandfather 41    Colon Cancer    DRUG ALLERGIES:   Allergies  Allergen Reactions  . Amoxicillin-Pot Clavulanate Diarrhea  . Codeine Hives    shakes  . Latex     MEDICATIONS AT HOME:   Prior to Admission medications   Medication Sig Start Date End Date Taking? Authorizing Provider  clonazePAM (KLONOPIN) 0.5 MG tablet Take 1 tablet (0.5 mg total) by mouth daily. 07/05/15    Leone Haven, MD  famotidine (PEPCID) 20 MG tablet Take 1 tablet (20 mg total) by mouth 2 (two) times daily. 09/17/15   Earleen Newport, MD  fluticasone (FLONASE) 50 MCG/ACT nasal spray Place 2 sprays into both nostrils daily. 12/06/14   Rubbie Battiest, NP  levothyroxine (SYNTHROID, LEVOTHROID) 100 MCG tablet Take 1 tablet (100 mcg total) by mouth daily. 07/06/15   Leone Haven, MD  lovastatin (MEVACOR) 40 MG tablet TAKE ONE TABLET BY MOUTH ONCE DAILY **MUST KEEP APPOINTMENT FOR FURTHER REFILLS** 09/12/15   Leone Haven, MD  nitrofurantoin, macrocrystal-monohydrate, (MACROBID) 100 MG capsule Take 1 capsule (100 mg total) by mouth 2 (two) times daily. 09/17/15   Earleen Newport, MD  ondansetron (ZOFRAN ODT) 4 MG disintegrating tablet Take 1 tablet (4 mg total) by mouth every 8 (eight) hours as needed for nausea or vomiting. 09/17/15   Earleen Newport, MD  sertraline (ZOLOFT) 100 MG tablet TAKE ONE TABLET BY MOUTH ONCE DAILY 03/13/15   Rubbie Battiest, NP    REVIEW OF SYSTEMS:  Review of Systems  Constitutional: Positive for malaise/fatigue. Negative for chills, fever and weight loss.  HENT: Negative for ear pain, hearing loss and tinnitus.   Eyes: Negative for blurred vision, double vision,  pain and redness.  Respiratory: Negative for cough, hemoptysis and shortness of breath.   Cardiovascular: Negative for chest pain, palpitations, orthopnea and leg swelling.  Gastrointestinal: Positive for diarrhea and nausea. Negative for abdominal pain, constipation and vomiting.  Genitourinary: Positive for dysuria. Negative for frequency and hematuria.  Musculoskeletal: Negative for back pain, joint pain and neck pain.  Skin:       No acne, rash, or lesions  Neurological: Negative for dizziness, tremors, focal weakness and weakness.  Endo/Heme/Allergies: Negative for polydipsia. Does not bruise/bleed easily.  Psychiatric/Behavioral: Negative for depression. The patient is not  nervous/anxious and does not have insomnia.      VITAL SIGNS:   Vitals:   09/17/15 2230 09/17/15 2244 09/17/15 2303 09/17/15 2325  BP: 113/65  127/65 122/63  Pulse: (!) 30  (!) 116 (!) 112  Resp: (!) 21  (!) 22 (!) 35  Temp:  (!) 101.9 F (38.8 C) 100.2 F (37.9 C)   TempSrc:  Oral Oral   SpO2: 99%  95% 93%  Weight:      Height:       Wt Readings from Last 3 Encounters:  09/17/15 63.5 kg (140 lb)  07/10/15 71.6 kg (157 lb 12.8 oz)  07/05/15 71.5 kg (157 lb 9.6 oz)    PHYSICAL EXAMINATION:  Physical Exam  Vitals reviewed. Constitutional: She is oriented to person, place, and time. She appears well-developed and well-nourished. No distress.  HENT:  Head: Normocephalic and atraumatic.  Mouth/Throat: Oropharynx is clear and moist.  Eyes: Conjunctivae and EOM are normal. Pupils are equal, round, and reactive to light. No scleral icterus.  Neck: Normal range of motion. Neck supple. No JVD present. No thyromegaly present.  Cardiovascular: Normal rate, regular rhythm and intact distal pulses.  Exam reveals no gallop and no friction rub.   No murmur heard. Respiratory: Effort normal and breath sounds normal. No respiratory distress. She has no wheezes. She has no rales.  GI: Soft. Bowel sounds are normal. She exhibits no distension. There is tenderness (mild).  Musculoskeletal: Normal range of motion. She exhibits no edema.  No arthritis, no gout  Lymphadenopathy:    She has no cervical adenopathy.  Neurological: She is alert and oriented to person, place, and time. No cranial nerve deficit.  No dysarthria, no aphasia  Skin: Skin is warm and dry. No rash noted. No erythema.  Psychiatric: She has a normal mood and affect. Her behavior is normal. Judgment and thought content normal.    LABORATORY PANEL:   CBC  Recent Labs Lab 09/17/15 1648  WBC 13.3*  HGB 14.6  HCT 42.1  PLT 143*    ------------------------------------------------------------------------------------------------------------------  Chemistries   Recent Labs Lab 09/17/15 1648  NA 138  K 3.9  CL 104  CO2 26  GLUCOSE 130*  BUN 16  CREATININE 0.76  CALCIUM 9.5  AST 24  ALT 24  ALKPHOS 86  BILITOT 0.8   ------------------------------------------------------------------------------------------------------------------  Cardiac Enzymes No results for input(s): TROPONINI in the last 168 hours. ------------------------------------------------------------------------------------------------------------------  RADIOLOGY:  No results found.  EKG:   Orders placed or performed during the hospital encounter of 09/17/15  . ED EKG  . ED EKG    IMPRESSION AND PLAN:  Principal Problem:   Sepsis ([place]) - IV antibiotics given in the ED, continue to admission. Cultures sent from the ED. Patient is hemodynamically stable, lactic acid is normal. Active Problems:   UTI (lower urinary tract infection) - IV antibiotics and cultures as above   Anxiety  state - home dose anxiolytics   Hyperlipidemia - continue home meds   Hypothyroidism - home dose thyroid replacement  All the records are reviewed and case discussed with ED provider. Management plans discussed with the patient and/or family.  DVT PROPHYLAXIS: SubQ lovenox  GI PROPHYLAXIS: None  ADMISSION STATUS: Inpatient  CODE STATUS: Full Code Status History    This patient does not have a recorded code status. Please follow your organizational policy for patients in this situation.      TOTAL TIME TAKING CARE OF THIS PATIENT: 45 minutes.    Jenny Roberts FIELDING 09/17/2015, 11:57 PM  [facility]  Office  [phone]  CC: Primary care physician; [name], MD

## 2015-09-17 NOTE — ED Triage Notes (Signed)
C/O N/V/D since this morning at around 0430.  Denies abdominal pain.

## 2015-09-17 NOTE — ED Notes (Signed)
Pt c/o N/V/D beginning at 0400 today. Pt reports stool still has some soft form to it. Pt denies fever

## 2015-09-18 LAB — CBC
HCT: 39.3 % (ref 35.0–47.0)
HEMOGLOBIN: 13.6 g/dL (ref 12.0–16.0)
MCH: 30.4 pg (ref 26.0–34.0)
MCHC: 34.6 g/dL (ref 32.0–36.0)
MCV: 87.8 fL (ref 80.0–100.0)
PLATELETS: 117 10*3/uL — AB (ref 150–440)
RBC: 4.47 MIL/uL (ref 3.80–5.20)
RDW: 12.8 % (ref 11.5–14.5)
WBC: 11.9 10*3/uL — ABNORMAL HIGH (ref 3.6–11.0)

## 2015-09-18 LAB — BASIC METABOLIC PANEL
ANION GAP: 7 (ref 5–15)
BUN: 14 mg/dL (ref 6–20)
CALCIUM: 8.6 mg/dL — AB (ref 8.9–10.3)
CO2: 25 mmol/L (ref 22–32)
CREATININE: 0.73 mg/dL (ref 0.44–1.00)
Chloride: 108 mmol/L (ref 101–111)
GFR calc Af Amer: >60 mL/min (ref >60)
GLUCOSE: 122 mg/dL — AB (ref 65–99)
Potassium: 3.3 mmol/L — ABNORMAL LOW (ref 3.5–5.1)
Sodium: 140 mmol/L (ref 135–145)

## 2015-09-18 LAB — MAGNESIUM: MAGNESIUM: 1.7 mg/dL (ref 1.7–2.4)

## 2015-09-18 MED ORDER — SODIUM CHLORIDE 0.9 % IV SOLN
INTRAVENOUS | Status: DC
  Administered 2015-09-18 – 2015-09-19 (×2): via INTRAVENOUS

## 2015-09-18 MED ORDER — ONDANSETRON HCL 4 MG/2ML IJ SOLN: 4.0000 mg | Freq: Four times a day (QID) | INTRAMUSCULAR | Status: DC | PRN

## 2015-09-18 MED ORDER — SODIUM CHLORIDE 0.9% FLUSH
3.0000 mL | Freq: Two times a day (BID) | INTRAVENOUS | Status: DC
  Administered 2015-09-18 – 2015-09-20 (×4): 3 mL via INTRAVENOUS

## 2015-09-18 MED ORDER — LEVOTHYROXINE SODIUM 50 MCG PO TABS
100.0000 ug | ORAL_TABLET | Freq: Every day | ORAL | Status: DC
  Administered 2015-09-18 – 2015-09-19 (×2): 100 ug via ORAL
  Filled 2015-09-18 (×2): qty 2

## 2015-09-18 MED ORDER — ACETAMINOPHEN 325 MG PO TABS
650.0000 mg | ORAL_TABLET | Freq: Four times a day (QID) | ORAL | Status: DC | PRN
  Administered 2015-09-18 – 2015-09-19 (×5): 650 mg via ORAL
  Filled 2015-09-18 (×5): qty 2

## 2015-09-18 MED ORDER — CLONAZEPAM 0.5 MG PO TABS
0.5000 mg | ORAL_TABLET | Freq: Every day | ORAL | Status: DC
  Administered 2015-09-18 – 2015-09-19 (×2): 0.5 mg via ORAL
  Filled 2015-09-18 (×3): qty 1

## 2015-09-18 MED ORDER — ENOXAPARIN SODIUM 40 MG/0.4ML ~~LOC~~ SOLN
40.0000 mg | SUBCUTANEOUS | Status: DC
  Administered 2015-09-18 – 2015-09-19 (×2): 40 mg via SUBCUTANEOUS
  Filled 2015-09-18 (×2): qty 0.4

## 2015-09-18 MED ORDER — PRAVASTATIN SODIUM 40 MG PO TABS
40.0000 mg | ORAL_TABLET | Freq: Every day | ORAL | Status: DC
  Administered 2015-09-18 – 2015-09-19 (×2): 40 mg via ORAL
  Filled 2015-09-18 (×2): qty 1

## 2015-09-18 MED ORDER — POTASSIUM CHLORIDE CRYS ER 20 MEQ PO TBCR
40.0000 meq | EXTENDED_RELEASE_TABLET | Freq: Once | ORAL | Status: AC
  Administered 2015-09-18: 40 meq via ORAL
  Filled 2015-09-18: qty 2

## 2015-09-18 MED ORDER — DEXTROSE 5 % IV SOLN
1.0000 g | INTRAVENOUS | Status: DC
  Administered 2015-09-19 – 2015-09-20 (×2): 1 g via INTRAVENOUS
  Filled 2015-09-18 (×2): qty 10

## 2015-09-18 MED ORDER — ONDANSETRON HCL 4 MG PO TABS: 4.0000 mg | ORAL_TABLET | Freq: Four times a day (QID) | ORAL | Status: DC | PRN

## 2015-09-18 MED ORDER — ACETAMINOPHEN 650 MG RE SUPP: 650.0000 mg | Freq: Four times a day (QID) | RECTAL | Status: DC | PRN

## 2015-09-18 MED ORDER — SERTRALINE HCL 50 MG PO TABS
100.0000 mg | ORAL_TABLET | Freq: Every day | ORAL | Status: DC
  Administered 2015-09-18 – 2015-09-19 (×2): 100 mg via ORAL
  Filled 2015-09-18 (×3): qty 2

## 2015-09-18 MED ORDER — DEXTROSE 5 % IV SOLN
2.0000 g | INTRAVENOUS | Status: DC
  Administered 2015-09-18: 09:00:00 2 g via INTRAVENOUS
  Filled 2015-09-18: qty 2

## 2015-09-18 NOTE — Care Management (Signed)
Admitted to [facility] with the diagnosis of sepsis. Lives with husband, Jenny Roberts, [phone]). Last seen Dr. Biagio Quint couple of months ago. No home health. No skilled facility. No home oxygen. Uses no aids for ambulation. Takes care of all basic and instrumental activities of daily living herself, drives. Baby sits her grand children. Prescriptions are filled at [facility] on [address]. No falls. Good appetite. Husband will transport. Shelbie Ammons RN MSN CCM Care Management [phone]

## 2015-09-18 NOTE — Progress Notes (Signed)
Jenny Roberts NAME: Jenny Roberts    MR#:  [id number]  DATE OF BIRTH:  1945-09-09  SUBJECTIVE:  CHIEF COMPLAINT:   Chief Complaint  Patient presents with  . Emesis  . Nausea   Nausea, vomiting and diarrhea multiple times. Dysuria. REVIEW OF SYSTEMS:  Review of Systems  Constitutional: Positive for malaise/fatigue. Negative for chills and fever.  HENT: Negative for congestion and sore throat.   Eyes: Negative for blurred vision and double vision.  Respiratory: Negative for cough, shortness of breath and wheezing.   Cardiovascular: Negative for chest pain, palpitations and leg swelling.  Gastrointestinal: Positive for diarrhea, nausea and vomiting. Negative for abdominal pain, blood in stool and melena.  Genitourinary: Positive for dysuria. Negative for urgency.  Musculoskeletal: Negative for back pain.  Skin: Negative for rash.  Neurological: Positive for headaches. Negative for dizziness, tingling, focal weakness and loss of consciousness.  Psychiatric/Behavioral: Negative for depression.    DRUG ALLERGIES:   Allergies  Allergen Reactions  . Amoxicillin-Pot Clavulanate Diarrhea and Other (See Comments)    Has patient had a PCN reaction causing immediate rash, facial/tongue/throat swelling, SOB or lightheadedness with hypotension: No Has patient had a PCN reaction causing severe rash involving mucus membranes or skin necrosis: No Has patient had a PCN reaction that required hospitalization No Has patient had a PCN reaction occurring within the last 10 years: Yes If all of the above answers are "NO", then may proceed with Cephalosporin use.   . Codeine Hives and Other (See Comments)    shakes  . Latex Rash   VITALS:  Blood pressure (!) 116/55, pulse 97, temperature 98.6 F (37 C), resp. rate 18, height 5\' 3"  (1.6 m), weight 140 lb (63.5 kg), SpO2 94 %. PHYSICAL EXAMINATION:  Physical Exam  Constitutional: She is  oriented to person, place, and time and well-developed, well-nourished, and in no distress. No distress.  HENT:  Head: Normocephalic and atraumatic.  Mouth/Throat: No oropharyngeal exudate.  Eyes: Conjunctivae and EOM are normal. Pupils are equal, round, and reactive to light. No scleral icterus.  Neck: Normal range of motion. Neck supple. No JVD present. No thyromegaly present.  Cardiovascular: Normal rate, regular rhythm and normal heart sounds.  Exam reveals no gallop.   No murmur heard. Pulmonary/Chest: Effort normal and breath sounds normal. No respiratory distress. She has no wheezes. She has no rales.  Abdominal: Soft. Bowel sounds are normal. She exhibits no distension. There is no tenderness.  Musculoskeletal: Normal range of motion. She exhibits no edema.  Lymphadenopathy:    She has no cervical adenopathy.  Neurological: She is alert and oriented to person, place, and time. No cranial nerve deficit.  Skin: Skin is warm and dry.  Psychiatric: Mood, memory, affect and judgment normal.   LABORATORY PANEL:   CBC  Recent Labs Lab 09/18/15 0503  WBC 11.9*  HGB 13.6  HCT 39.3  PLT 117*   ------------------------------------------------------------------------------------------------------------------ Chemistries   Recent Labs Lab 09/17/15 1648 09/18/15 0503  NA 138 140  K 3.9 3.3*  CL 104 108  CO2 26 25  GLUCOSE 130* 122*  BUN 16 14  CREATININE 0.76 0.73  CALCIUM 9.5 8.6*  MG  --  1.7  AST 24  --   ALT 24  --   ALKPHOS 86  --   BILITOT 0.8  --    RADIOLOGY:  No results found. ASSESSMENT AND PLAN:   Sepsis with UTI Continue IV Rocephin and  follow-up urine and cultures. Leukocytosis is improving.  Hypokalemia. Give potassium supplement, magnesium is normal.   Anxiety state - home dose anxiolytics   Hyperlipidemia - continue home meds   Hypothyroidism - home dose thyroid replacement.  All the records are reviewed and case discussed with Care  Management/Social Worker. Management plans discussed with the patient, her brother and they are in agreement.  CODE STATUS: Full code  TOTAL TIME TAKING CARE OF THIS PATIENT: 37 minutes.   More than 50% of the time was spent in counseling/coordination of care: YES  POSSIBLE D/C IN 2 DAYS, DEPENDING ON CLINICAL CONDITION.   Demetrios Loll M.D on 09/18/2015 at 2:43 PM  Between 7am to 6pm - Pager - [phone]  After 6pm go to [email or web address] - Proofreader  [facility]  Office  [phone]  CC: Primary care physician; [name], MD  Note: This dictation was prepared with Dragon dictation along with smaller phrase technology. Any transcriptional errors that result from this process are unintentional.

## 2015-09-18 NOTE — Plan of Care (Signed)
Problem: Bowel/Gastric: Goal: Will not experience complications related to bowel motility Outcome: Not Progressing Pt with multiple diarrhea stools this shift; Dr Bridgett Larsson verbally made aware of this, RN asked Dr Bridgett Larsson if he wanted staff to collect stool specimen for any testing; no orders received to date

## 2015-09-18 NOTE — Care Management Important Message (Signed)
Important Message  Patient Details  Name: Jenny Roberts MRN: [id number] Date of Birth: 25-Jul-1945   Medicare Important Message Given:  Yes    Shelbie Ammons, RN 09/18/2015, 9:02 AM

## 2015-09-18 NOTE — Progress Notes (Signed)
Pharmacy Antibiotic Note  Jenny Roberts is a 70 y.o. female admitted on 09/17/2015 with UTI.  Pharmacy has been consulted for ceftriaxone dosing.  Plan: Ceftriaxone 2 grams q 24 hours ordered.  Height: 5\' 3"  (160 cm) Weight: 140 lb (63.5 kg) IBW/kg (Calculated) : 52.4  Temp (24hrs), Avg:100.2 F (37.9 C), Min:98.2 F (36.8 C), Max:102.9 F (39.4 C)   Recent Labs Lab 09/17/15 1648 09/17/15 2218  WBC 13.3*  --   CREATININE 0.76  --   LATICACIDVEN  --  1.4    Estimated Creatinine Clearance: 58.7 mL/min (by C-G formula based on SCr of 0.8 mg/dL).    Allergies  Allergen Reactions  . Amoxicillin-Pot Clavulanate Diarrhea and Other (See Comments)    Has patient had a PCN reaction causing immediate rash, facial/tongue/throat swelling, SOB or lightheadedness with hypotension: No Has patient had a PCN reaction causing severe rash involving mucus membranes or skin necrosis: No Has patient had a PCN reaction that required hospitalization No Has patient had a PCN reaction occurring within the last 10 years: Yes If all of the above answers are "NO", then may proceed with Cephalosporin use.   . Codeine Hives and Other (See Comments)    shakes  . Latex Rash    Antimicrobials this admission: ceftriaxone  >>    >>   Dose adjustments this admission:   Microbiology results:   8/28 UA: LE(tr)  NO2(-) WBC 6-30  Thank you for allowing pharmacy to be a part of this patient's care.  Hava Massingale S 09/18/2015 3:42 AM

## 2015-09-18 NOTE — Plan of Care (Signed)
Problem: Pain Managment: Goal: General experience of comfort will improve Outcome: Progressing PRN Tylenol for headache with relief

## 2015-09-18 NOTE — ED Notes (Signed)
Called floor, spoke with [facility] RN to update her on pt status/vitals, and let her know pt on the way to floor

## 2015-09-19 LAB — BASIC METABOLIC PANEL
ANION GAP: 4 — AB (ref 5–15)
BUN: 10 mg/dL (ref 6–20)
CALCIUM: 8.1 mg/dL — AB (ref 8.9–10.3)
CO2: 24 mmol/L (ref 22–32)
CREATININE: 0.7 mg/dL (ref 0.44–1.00)
Chloride: 109 mmol/L (ref 101–111)
GLUCOSE: 114 mg/dL — AB (ref 65–99)
POTASSIUM: 3.5 mmol/L (ref 3.5–5.1)
Sodium: 137 mmol/L (ref 135–145)

## 2015-09-19 LAB — MAGNESIUM: MAGNESIUM: 1.8 mg/dL (ref 1.7–2.4)

## 2015-09-19 MED ORDER — IBUPROFEN 400 MG PO TABS
400.0000 mg | ORAL_TABLET | Freq: Four times a day (QID) | ORAL | Status: DC | PRN
  Administered 2015-09-19: 14:00:00 400 mg via ORAL
  Filled 2015-09-19 (×2): qty 1

## 2015-09-19 NOTE — Progress Notes (Signed)
[place] at [place] NAME: Aaleigha Merrell    MR#:  [id number]  DATE OF BIRTH:  [birth year]  SUBJECTIVE:  CHIEF COMPLAINT:   Chief Complaint  Patient presents with  . Emesis  . Nausea   Has mild nausea and dysuria, no vomiting or diarrhea.  REVIEW OF SYSTEMS:  Review of Systems  Constitutional: Positive for malaise/fatigue. Negative for chills and fever.  HENT: Negative for congestion and sore throat.   Eyes: Negative for blurred vision and double vision.  Respiratory: Negative for cough, shortness of breath and wheezing.   Cardiovascular: Negative for chest pain, palpitations and leg swelling.  Gastrointestinal: Positive for nausea. Negative for abdominal pain, blood in stool, diarrhea, melena and vomiting.  Genitourinary: Positive for dysuria. Negative for urgency.  Musculoskeletal: Negative for back pain.  Skin: Negative for rash.  Neurological: Positive for headaches. Negative for dizziness, tingling, focal weakness and loss of consciousness.  Psychiatric/Behavioral: Negative for depression.    DRUG ALLERGIES:   Allergies  Allergen Reactions  . Amoxicillin-Pot Clavulanate Diarrhea and Other (See Comments)    Has patient had a PCN reaction causing immediate rash, facial/tongue/throat swelling, SOB or lightheadedness with hypotension: No Has patient had a PCN reaction causing severe rash involving mucus membranes or skin necrosis: No Has patient had a PCN reaction that required hospitalization No Has patient had a PCN reaction occurring within the last 10 years: Yes If all of the above answers are "NO", then may proceed with Cephalosporin use.   . Codeine Hives and Other (See Comments)    shakes  . Latex Rash   VITALS:  Blood pressure (!) 98/46, pulse 80, temperature 98.1 F (36.7 C), temperature source Oral, resp. rate 20, height 5\' 3"  (1.6 m), weight 140 lb (63.5 kg), SpO2 95 %. PHYSICAL EXAMINATION:  Physical Exam    Constitutional: She is oriented to person, place, and time and well-developed, well-nourished, and in no distress. No distress.  HENT:  Head: Normocephalic and atraumatic.  Mouth/Throat: No oropharyngeal exudate.  Eyes: Conjunctivae and EOM are normal. Pupils are equal, round, and reactive to light. No scleral icterus.  Neck: Normal range of motion. Neck supple. No JVD present. No thyromegaly present.  Cardiovascular: Normal rate, regular rhythm and normal heart sounds.  Exam reveals no gallop.   No murmur heard. Pulmonary/Chest: Effort normal and breath sounds normal. No respiratory distress. She has no wheezes. She has no rales.  Abdominal: Soft. Bowel sounds are normal. She exhibits no distension. There is no tenderness.  Musculoskeletal: Normal range of motion. She exhibits no edema.  Lymphadenopathy:    She has no cervical adenopathy.  Neurological: She is alert and oriented to person, place, and time. No cranial nerve deficit.  Skin: Skin is warm and dry.  Psychiatric: Mood, memory, affect and judgment normal.   LABORATORY PANEL:   CBC  Recent Labs Lab 09/18/15 0503  WBC 11.9*  HGB 13.6  HCT 39.3  PLT 117*   ------------------------------------------------------------------------------------------------------------------ Chemistries   Recent Labs Lab 09/17/15 1648  09/19/15 0816  NA 138  < > 137  K 3.9  < > 3.5  CL 104  < > 109  CO2 26  < > 24  GLUCOSE 130*  < > 114*  BUN 16  < > 10  CREATININE 0.76  < > 0.70  CALCIUM 9.5  < > 8.1*  MG  --   < > 1.8  AST 24  --   --  ALT 24  --   --   ALKPHOS 86  --   --   BILITOT 0.8  --   --   < > = values in this interval not displayed. RADIOLOGY:  No results found. ASSESSMENT AND PLAN:   Sepsis with UTI Continue IV Rocephin and follow-up urine cultures (GNR). Leukocytosis is improving.  Hypokalemia. Improved with potassium supplement, magnesium is normal.   Anxiety state - home dose anxiolytics   Hyperlipidemia  - continue home meds   Hypothyroidism - home dose thyroid replacement.  All the records are reviewed and case discussed with Care Management/Social Worker. Management plans discussed with the patient, her brother and they are in agreement.  CODE STATUS: Full code  TOTAL TIME TAKING CARE OF THIS PATIENT: 33 minutes.   More than 50% of the time was spent in counseling/coordination of care: YES  POSSIBLE D/C IN 1-2 DAYS, DEPENDING ON CLINICAL CONDITION.   Demetrios Loll M.D on 09/19/2015 at 2:31 PM  Between 7am to 6pm - Pager - [phone]  After 6pm go to [email or web address] - Proofreader  [facility]  Office  [phone]  CC: Primary care physician; [name], MD  Note: This dictation was prepared with Dragon dictation along with smaller phrase technology. Any transcriptional errors that result from this process are unintentional.

## 2015-09-19 NOTE — Plan of Care (Signed)
Problem: Pain Managment: Goal: General experience of comfort will improve Outcome: Progressing Pt c/o headache, prn meds given X2 with relief.  Problem: Bowel/Gastric: Goal: Will not experience complications related to bowel motility Outcome: Progressing No loose stools this shift.

## 2015-09-20 MED ORDER — CIPROFLOXACIN HCL 500 MG PO TABS: 500.0000 mg | ORAL_TABLET | Freq: Two times a day (BID) | ORAL | 0 refills | Status: DC

## 2015-09-20 MED ORDER — LEVOTHYROXINE SODIUM 100 MCG PO TABS
100.0000 ug | ORAL_TABLET | Freq: Every day | ORAL | Status: DC
  Administered 2015-09-20: 100 ug via ORAL
  Filled 2015-09-20: qty 1

## 2015-09-20 NOTE — Progress Notes (Signed)
Pharmacy Antibiotic Note  Jenny Roberts is a 70 y.o. female admitted on 09/17/2015 with UTI.  Pharmacy has been consulted for ceftriaxone dosing.  Plan: Ceftriaxone 1 grams IV q 24 hours ordered.  Height: 5\' 3"  (160 cm) Weight: 140 lb (63.5 kg) IBW/kg (Calculated) : 52.4  Temp (24hrs), Avg:98.1 F (36.7 C), Min:98 F (36.7 C), Max:98.2 F (36.8 C)   Recent Labs Lab 09/17/15 1648 09/17/15 2218 09/18/15 0503 09/19/15 0816  WBC 13.3*  --  11.9*  --   CREATININE 0.76  --  0.73 0.70  LATICACIDVEN  --  1.4  --   --     Estimated Creatinine Clearance: 58.7 mL/min (by C-G formula based on SCr of 0.8 mg/dL).    Allergies  Allergen Reactions  . Amoxicillin-Pot Clavulanate Diarrhea and Other (See Comments)    Has patient had a PCN reaction causing immediate rash, facial/tongue/throat swelling, SOB or lightheadedness with hypotension: No Has patient had a PCN reaction causing severe rash involving mucus membranes or skin necrosis: No Has patient had a PCN reaction that required hospitalization No Has patient had a PCN reaction occurring within the last 10 years: Yes If all of the above answers are "NO", then may proceed with Cephalosporin use.   . Codeine Hives and Other (See Comments)    shakes  . Latex Rash    Antimicrobials this admission: ceftriaxone  >>    >>   Dose adjustments this admission:   Microbiology results: UCx GNR  8/28 UA: LE(tr)  NO2(-) WBC 6-30  Thank you for allowing pharmacy to be a part of this patient's care.  Rocky Morel 09/20/2015 9:41 AM

## 2015-09-20 NOTE — Discharge Instructions (Signed)
Heart healthy diet. °Activity as tolerated. °

## 2015-09-20 NOTE — Discharge Summary (Addendum)
[place] at [place] NAME: Jenny Roberts    MR#:  [id number]  DATE OF BIRTH:  05/26/1945  DATE OF ADMISSION:  09/17/2015   ADMITTING PHYSICIAN: Lance Coon, MD  DATE OF DISCHARGE: 09/20/2015 12:35 PM  PRIMARY CARE PHYSICIAN: [name], MD   ADMISSION DIAGNOSIS:  Gastroenteritis [K52.9] DISCHARGE DIAGNOSIS:  Principal Problem:   Sepsis ([place]) Active Problems:   Anxiety state   Hyperlipidemia   Hypothyroidism   UTI (lower urinary tract infection) Sepsis withUTI (ESBL) SECONDARY DIAGNOSIS:   Past Medical History:  Diagnosis Date  . Anxiety   . Breast cancer ([place]) 2001   Right. Treated with lumpectomy followed by Chemotherapy and Radiation therapy  . Depression   . Hyperlipidemia   . Skin cancer 09/2004   [facility] at [facility]  . Thyroid disease    HOSPITAL COURSE:   Sepsis withUTI (ESBL) She was treated with IV Rocephin and follow-up urine cultures (GNR). Leukocytosis is improved. The patient has improved clinically but no final urine culture and sensitivity so far. The patient has amoxicillin allergy, changed to Cipro by mouth twice a day for 2 more days. Her urine culture report came back this morning which showed ESBL. The patient's primary care physician discontinued Cipro and changed to Bactrim.  Hypokalemia. Improved with potassium supplement, magnesium is normal. Anxiety state - home dose anxiolytics Hyperlipidemia - continue home meds Hypothyroidism - home dose thyroid replacement.  DISCHARGE CONDITIONS:  Stable, discharge to home today. CONSULTS OBTAINED:   DRUG ALLERGIES:   Allergies  Allergen Reactions  . Amoxicillin-Pot Clavulanate Diarrhea and Other (See Comments)    Has patient had a PCN reaction causing immediate rash, facial/tongue/throat swelling, SOB or lightheadedness with hypotension: No Has patient had a PCN reaction causing severe rash involving mucus membranes or skin necrosis: No Has  patient had a PCN reaction that required hospitalization No Has patient had a PCN reaction occurring within the last 10 years: Yes If all of the above answers are "NO", then may proceed with Cephalosporin use.   . Codeine Hives and Other (See Comments)    shakes  . Latex Rash   DISCHARGE MEDICATIONS:     Medication List    TAKE these medications   ciprofloxacin 500 MG tablet Commonly known as:  CIPRO Take 1 tablet (500 mg total) by mouth 2 (two) times daily.   clonazePAM 0.5 MG tablet Commonly known as:  KLONOPIN Take 1 tablet (0.5 mg total) by mouth daily. What changed:  when to take this   famotidine 20 MG tablet Commonly known as:  PEPCID Take 1 tablet (20 mg total) by mouth 2 (two) times daily.   fluticasone 50 MCG/ACT nasal spray Commonly known as:  FLONASE Place 2 sprays into both nostrils daily. What changed:  when to take this  reasons to take this   levothyroxine 100 MCG tablet Commonly known as:  SYNTHROID, LEVOTHROID Take 1 tablet (100 mcg total) by mouth daily.   lovastatin 40 MG tablet Commonly known as:  MEVACOR Take 40 mg by mouth at bedtime.   sertraline 100 MG tablet Commonly known as:  ZOLOFT Take 100 mg by mouth every evening.        DISCHARGE INSTRUCTIONS:   DIET:  Heart Healthy diet DISCHARGE CONDITION:  Stable ACTIVITY:  As tolerated DISCHARGE LOCATION:    If you experience worsening of your admission symptoms, develop shortness of breath, life threatening emergency, suicidal or homicidal thoughts you must seek medical attention  immediately by calling 911 or calling your MD immediately  if symptoms less severe.  You Must read complete instructions/literature along with all the possible adverse reactions/side effects for all the Medicines you take and that have been prescribed to you. Take any new Medicines after you have completely understood and accpet all the possible adverse reactions/side effects.   Please note  You were  cared for by a hospitalist during your hospital stay. If you have any questions about your discharge medications or the care you received while you were in the hospital after you are discharged, you can call the unit and asked to speak with the hospitalist on call if the hospitalist that took care of you is not available. Once you are discharged, your primary care physician will handle any further medical issues. Please note that NO REFILLS for any discharge medications will be authorized once you are discharged, as it is imperative that you return to your primary care physician (or establish a relationship with a primary care physician if you do not have one) for your aftercare needs so that they can reassess your need for medications and monitor your lab values.    On the day of Discharge:  VITAL SIGNS:  Blood pressure (!) 99/55, pulse 65, temperature 98.2 F (36.8 C), temperature source Oral, resp. rate 16, height 5\' 3"  (1.6 m), weight 140 lb (63.5 kg), SpO2 95 %. PHYSICAL EXAMINATION:  GENERAL:  70 y.o.-year-old patient lying in the bed with no acute distress.  EYES: Pupils equal, round, reactive to light and accommodation. No scleral icterus. Extraocular muscles intact.  HEENT: Head atraumatic, normocephalic. Oropharynx and nasopharynx clear.  NECK:  Supple, no jugular venous distention. No thyroid enlargement, no tenderness.  LUNGS: Normal breath sounds bilaterally, no wheezing, rales,rhonchi or crepitation. No use of accessory muscles of respiration.  CARDIOVASCULAR: S1, S2 normal. No murmurs, rubs, or gallops.  ABDOMEN: Soft, non-tender, non-distended. Bowel sounds present. No organomegaly or mass.  EXTREMITIES: No pedal edema, cyanosis, or clubbing.  NEUROLOGIC: Cranial nerves II through XII are intact. Muscle strength 5/5 in all extremities. Sensation intact. Gait not checked.  PSYCHIATRIC: The patient is alert and oriented x 3.  SKIN: No obvious rash, lesion, or ulcer.  DATA REVIEW:    CBC  Recent Labs Lab 09/18/15 0503  WBC 11.9*  HGB 13.6  HCT 39.3  PLT 117*    Chemistries   Recent Labs Lab 09/17/15 1648  09/19/15 0816  NA 138  < > 137  K 3.9  < > 3.5  CL 104  < > 109  CO2 26  < > 24  GLUCOSE 130*  < > 114*  BUN 16  < > 10  CREATININE 0.76  < > 0.70  CALCIUM 9.5  < > 8.1*  MG  --   < > 1.8  AST 24  --   --   ALT 24  --   --   ALKPHOS 86  --   --   BILITOT 0.8  --   --   < > = values in this interval not displayed.   Microbiology Results  Results for orders placed or performed during the hospital encounter of 09/17/15  Urine culture     Status: Abnormal (Preliminary result)   Collection Time: 09/17/15  4:48 PM  Result Value Ref Range Status   Specimen Description URINE, CLEAN CATCH  Final   Special Requests NONE  Final   Culture (A)  Final    >=100,000 COLONIES/mL  GRAM NEGATIVE RODS REPEATING ID Performed at [facility]    Report Status PENDING  Incomplete    RADIOLOGY:  No results found.   Management plans discussed with the patient, family and they are in agreement.  CODE STATUS:     Code Status Orders        Start     Ordered   09/18/15 0203  Full code  Continuous     09/18/15 0202    Code Status History    Date Active Date Inactive Code Status Order ID Comments User Context   This patient has a current code status but no historical code status.      TOTAL TIME TAKING CARE OF THIS PATIENT: 33 minutes.    Demetrios Loll M.D on 09/20/2015 at 1:24 PM  Between 7am to 6pm - Pager - [phone]  After 6pm go to [email or web address] - Proofreader  [facility]  Office  [phone]  CC: Primary care physician; [name], MD   Note: This dictation was prepared with Dragon dictation along with smaller phrase technology. Any transcriptional errors that result from this process are unintentional.

## 2015-09-20 NOTE — Progress Notes (Signed)
Patient d/ced home.  N/V and diarrhea resolved.  Patient states that she's feeling better.  Going home on cipro, macrobid, zofran and pepcid.  IV removed, d/c instructions reviewed.  Patient going home with husband.

## 2015-09-21 ENCOUNTER — Telehealth: Payer: Self-pay

## 2015-09-21 ENCOUNTER — Other Ambulatory Visit: Payer: Self-pay | Admitting: Family Medicine

## 2015-09-21 LAB — URINE CULTURE: Culture: >=100000 — AB

## 2015-09-21 MED ORDER — SULFAMETHOXAZOLE-TRIMETHOPRIM 800-160 MG PO TABS: 1.0000 | ORAL_TABLET | Freq: Two times a day (BID) | ORAL | 0 refills | Status: DC

## 2015-09-21 NOTE — Telephone Encounter (Signed)
Unable to reach patient. No answer. No voice mail. Will continue to follow with transitional care management.

## 2015-09-22 ENCOUNTER — Telehealth: Payer: Self-pay

## 2015-09-22 NOTE — Telephone Encounter (Signed)
Transition Care Management Follow-up Telephone Call   Date discharged? 09/20/15   How have you been since you were released from the hospital? [place].  SYMPTOMS HAVE SUBSIDED.  NO PAIN. NO N/V/D. INCREASED APPETITE.    Do you understand why you were in the hospital? YES, GASTROENTERITIS, UTI.   Do you understand the discharge instructions? YES, INCREASE FLUID INTAKE.  INCREASE ACTIVITY AS TOLERATED.   Where were you discharged to? Home.   Items Reviewed:  Medications reviewed: STOPPED CIPRO, STARTED BACTRIM.TAKING ALL SCHEDULED MEDICATIONS AS APPROPRIATE.    Allergies reviewed: YES, AMOXICILLIN, CODEINE, LATEX.  Dietary changes reviewed: YES, HEART HEALTHY.  Referrals reviewed: YES, HFU WITH PCP.   Functional Questionnaire:   Activities of Daily Living (ADLs):   She states they are independent in the following: Independent in all ADLs. States they require assistance with the following: Does not require any assistance at this time.   Any transportation issues/concerns?: NO.   Any patient concerns? None at this time.   Confirmed importance and date/time of follow-up visits scheduled YES, appointment scheduled 10/06/15 at 4:00.  Provider Appointment booked with Dr. Caryl Bis (PCP).  Confirmed with patient if condition begins to worsen call PCP or go to the ER.  Patient was given the office number and encouraged to call back with question or concerns.  : YES, PATIENT VERBALIZED UNDERSTANDING.

## 2015-10-02 ENCOUNTER — Ambulatory Visit: Payer: No Typology Code available for payment source

## 2015-10-06 ENCOUNTER — Ambulatory Visit (INDEPENDENT_AMBULATORY_CARE_PROVIDER_SITE_OTHER): Payer: Medicare Other | Admitting: Family Medicine

## 2015-10-06 VITALS — BP 120/84 | HR 79 | Temp 98.0°F | Wt 151.4 lb

## 2015-10-06 DIAGNOSIS — Z23 Encounter for immunization: Secondary | ICD-10-CM | POA: Diagnosis not present

## 2015-10-06 DIAGNOSIS — N390 Urinary tract infection, site not specified: Secondary | ICD-10-CM | POA: Diagnosis not present

## 2015-10-06 DIAGNOSIS — K529 Noninfective gastroenteritis and colitis, unspecified: Secondary | ICD-10-CM

## 2015-10-06 DIAGNOSIS — E876 Hypokalemia: Secondary | ICD-10-CM | POA: Diagnosis not present

## 2015-10-06 NOTE — Assessment & Plan Note (Signed)
Asymptomatic now. Has finished Bactrim. She'll monitor for recurrence.

## 2015-10-06 NOTE — Assessment & Plan Note (Signed)
Patient's symptoms most likely related to viral gastroenteritis. Completely improved at this time. She'll continue to monitor for recurrence. We will check a BMP given her hypokalemia.

## 2015-10-06 NOTE — Progress Notes (Signed)
  [name], MD Phone: [phone]  Jenny Roberts is a 70 y.o. female who presents today for follow-up.  Patient recently hospitalized for nausea and diarrhea. Felt to have gastroenteritis. Also found to have a UTI with Escherichia coli ESBL. Sensitive to Bactrim. She was switched to Bactrim by me after discharge from the hospital. She reports she has done well since discharge. She finished the Bactrim. She's not had any dysuria, frequency, urgency, nausea, vomiting, or diarrhea. No abdominal pain. She feels well. Had some mild hypokalemia in the hospital though this returned to normal range.  ROS see history of present illness  Objective  Physical Exam Vitals:   10/06/15 1553  BP: 120/84  Pulse: 79  Temp: 98 F (36.7 C)    BP Readings from Last 3 Encounters:  10/06/15 120/84  09/20/15 (!) 99/55  07/10/15 118/78   Wt Readings from Last 3 Encounters:  10/06/15 151 lb 6.4 oz (68.7 kg)  09/17/15 140 lb (63.5 kg)  07/10/15 157 lb 12.8 oz (71.6 kg)    Physical Exam  Constitutional: No distress.  HENT:  Head: Normocephalic and atraumatic.  Cardiovascular: Normal rate, regular rhythm and normal heart sounds.   Pulmonary/Chest: Effort normal and breath sounds normal.  Abdominal: Soft. Bowel sounds are normal. She exhibits no distension. There is no tenderness. There is no rebound and no guarding.  Musculoskeletal: She exhibits no edema.  Neurological: She is alert. Gait normal.  Skin: Skin is warm and dry. She is not diaphoretic.     Assessment/Plan: Please see individual problem list.  Gastroenteritis Patient's symptoms most likely related to viral gastroenteritis. Completely improved at this time. She'll continue to monitor for recurrence. We will check a BMP given her hypokalemia.  UTI (lower urinary tract infection) Asymptomatic now. Has finished Bactrim. She'll monitor for recurrence.   Orders Placed This Encounter  Procedures  . Flu vaccine HIGH DOSE PF  (Fluzone High dose)  . Basic metabolic panel    [name], MD [place]

## 2015-10-06 NOTE — Patient Instructions (Signed)
Nice to see you. I am glad you're doing better. We will get some lab work today to check on your potassium and call you with the results. If you have recurrence of symptoms please seek medical attention.

## 2015-10-06 NOTE — Progress Notes (Signed)
Pre visit review using our clinic review tool, if applicable. No additional management support is needed unless otherwise documented below in the visit note. 

## 2015-10-07 LAB — BASIC METABOLIC PANEL
BUN: 20 mg/dL (ref 7–25)
CALCIUM: 9.5 mg/dL (ref 8.6–10.4)
CO2: 25 mmol/L (ref 20–31)
CREATININE: 0.88 mg/dL (ref 0.60–0.93)
Chloride: 107 mmol/L (ref 98–110)
GLUCOSE: 87 mg/dL (ref 65–99)
Potassium: 4.3 mmol/L (ref 3.5–5.3)
SODIUM: 142 mmol/L (ref 135–146)

## 2015-10-09 ENCOUNTER — Other Ambulatory Visit: Payer: Self-pay | Admitting: Family Medicine

## 2015-10-09 ENCOUNTER — Other Ambulatory Visit: Payer: Self-pay | Admitting: General Surgery

## 2015-10-09 ENCOUNTER — Encounter: Payer: Self-pay | Admitting: *Deleted

## 2015-10-09 ENCOUNTER — Ambulatory Visit
Admission: RE | Admit: 2015-10-09 | Discharge: 2015-10-09 | Disposition: A | Discharge status: Home / Self Care | Payer: Medicare Other | Source: Ambulatory Visit | Attending: General Surgery | Admitting: General Surgery

## 2015-10-09 DIAGNOSIS — Z1231 Encounter for screening mammogram for malignant neoplasm of breast: Secondary | ICD-10-CM | POA: Insufficient documentation

## 2015-10-09 NOTE — Telephone Encounter (Signed)
Last filled on 07/05/15 #90, last OV 10/06/15. Ok to refill?

## 2015-10-09 NOTE — Telephone Encounter (Signed)
Please fax prescription to pharmacy.

## 2015-10-09 NOTE — Telephone Encounter (Signed)
Faxed to [place], [place]: [phone]

## 2015-10-12 ENCOUNTER — Ambulatory Visit: Payer: Medicare Other | Admitting: General Surgery

## 2015-10-16 ENCOUNTER — Ambulatory Visit (INDEPENDENT_AMBULATORY_CARE_PROVIDER_SITE_OTHER): Payer: Medicare Other | Admitting: General Surgery

## 2015-10-16 ENCOUNTER — Encounter: Payer: Self-pay | Admitting: General Surgery

## 2015-10-16 VITALS — BP 132/74 | HR 76 | Resp 12 | Ht 60.0 in | Wt 151.0 lb

## 2015-10-16 DIAGNOSIS — Z853 Personal history of malignant neoplasm of breast: Secondary | ICD-10-CM | POA: Diagnosis not present

## 2015-10-16 NOTE — Progress Notes (Signed)
Patient ID: Jenny Roberts, female   DOB: 1945-02-21, 70 y.o.   MRN: [id number]  Chief Complaint  Patient presents with  . Follow-up    mammogram    HPI Jenny Roberts is a 70 y.o. female who presents for a breast cancer follow up. The most recent mammogram was done on 10/02/15. No complaints Patient does perform regular self breast checks and gets regular mammograms done.    I have reviewed the history of present illness with the patient.   HPI  Past Medical History:  Diagnosis Date  . Anxiety   . Breast cancer ([place]) 2001   Right. Treated with lumpectomy followed by Chemotherapy and Radiation therapy  . Depression   . Hyperlipidemia   . Skin cancer 09/2004   treated at [facility]  . Thyroid disease     Past Surgical History:  Procedure Laterality Date  . ABDOMINAL HYSTERECTOMY  1983   Left parts of each ovary   . BREAST BIOPSY Right 2001   positive  . BREAST EXCISIONAL BIOPSY Right 1990's   benign  . BREAST LUMPECTOMY Right 2001  . CARPAL TUNNEL RELEASE  2016  . COLONOSCOPY  2008  . KNEE SURGERY  2003  . mole excision  2006   Basal cell   . WRIST SURGERY Right 2010    Family History  Problem Relation Age of Onset  . Brain cancer Father   . Cancer Maternal Grandfather 54    Colon Cancer    Social History Social History  Substance Use Topics  . Smoking status: Never Smoker  . Smokeless tobacco: Never Used  . Alcohol use No    Allergies  Allergen Reactions  . Amoxicillin-Pot Clavulanate Diarrhea and Other (See Comments)    Has patient had a PCN reaction causing immediate rash, facial/tongue/throat swelling, SOB or lightheadedness with hypotension: No Has patient had a PCN reaction causing severe rash involving mucus membranes or skin necrosis: No Has patient had a PCN reaction that required hospitalization No Has patient had a PCN reaction occurring within the last 10 years: Yes If all of the above answers are "NO", then may proceed with Cephalosporin use.   . Codeine Hives and Other (See Comments)    shakes  . Latex Rash    Current Outpatient Prescriptions  Medication Sig Dispense Refill  . clonazePAM (KLONOPIN) 0.5 MG tablet TAKE ONE TABLET BY MOUTH ONCE DAILY 90 tablet 1  . fluticasone (FLONASE) 50 MCG/ACT nasal spray Place 2 sprays into both nostrils daily. (Patient taking differently: Place 2 sprays into both nostrils daily as needed for allergies. ) 16 g 6  . levothyroxine (SYNTHROID, LEVOTHROID) 100 MCG tablet Take 1 tablet (100 mcg total) by mouth daily. 90 tablet 3  . lovastatin (MEVACOR) 40 MG tablet Take 40 mg by mouth at bedtime.    . sertraline (ZOLOFT) 100 MG tablet Take 100 mg by mouth every evening.     No current facility-administered medications for this visit.     Review of Systems Review of Systems  Constitutional: Negative.   Respiratory: Negative.   Cardiovascular: Negative.     Blood pressure 132/74, pulse 76, resp. rate 12, height 5' (1.524 m), weight 151 lb (68.5 kg).  Physical Exam Physical Exam  Constitutional: She is oriented to person, place, and time. She appears well-developed and well-nourished.  Eyes: Conjunctivae are normal.  Neck: Neck supple.  Cardiovascular: Normal rate, regular rhythm and normal heart sounds.   Pulmonary/Chest: Effort normal and breath sounds normal. Right  breast exhibits no inverted nipple, no mass, no nipple discharge, no skin change and no tenderness. Left breast exhibits no inverted nipple, no mass, no nipple discharge, no skin change and no tenderness.  Abdominal: Soft. Bowel sounds are normal. There is no tenderness.  Lymphadenopathy:    She has no cervical adenopathy.    She has no axillary adenopathy.  Neurological: She is alert and oriented to person, place, and time.  Skin: Skin is warm and dry.    Data Reviewed Mammogram reviewed  Assessment    Stable exam. History of right breast cancer.    Plan    Patient will be asked to return to the office in one  year with a bilateral screening mammogram.   This information has been scribed by Gaspar Cola CMA.  Lovena Kluck G 10/17/2015, 12:38 PM

## 2015-10-16 NOTE — Patient Instructions (Signed)
Patient will be asked to return to the office in one year with a bilateral screening mammogram. 

## 2015-10-17 ENCOUNTER — Encounter: Payer: Self-pay | Admitting: General Surgery

## 2015-10-17 LAB — CANCER ANTIGEN 27.29: CA 27.29: 31.4 U/mL (ref 0.0–38.6)

## 2015-10-17 NOTE — Progress Notes (Signed)
Inform pt labs are normal. F/u as scheduled

## 2015-10-18 ENCOUNTER — Telehealth: Payer: Self-pay | Admitting: *Deleted

## 2015-10-18 NOTE — Telephone Encounter (Signed)
Notified patient as instructed, patient pleased. Discussed follow-up appointments, patient agrees  

## 2015-10-18 NOTE — Telephone Encounter (Signed)
-----   Message from Christene Lye, MD sent at 10/17/2015  5:46 PM EDT ----- Inform pt labs are normal. F/u as scheduled

## 2015-11-13 ENCOUNTER — Other Ambulatory Visit: Payer: Self-pay | Admitting: Family Medicine

## 2015-11-14 NOTE — Telephone Encounter (Signed)
Refill sent to pharmacy.   

## 2015-11-14 NOTE — Telephone Encounter (Signed)
Historical medication. Pt last seen 10/06/15. Lipid profile done 12/19/14. Please advise?

## 2015-12-20 ENCOUNTER — Encounter: Payer: No Typology Code available for payment source | Admitting: Family Medicine

## 2015-12-20 ENCOUNTER — Encounter: Payer: No Typology Code available for payment source | Admitting: Nurse Practitioner

## 2016-03-11 ENCOUNTER — Other Ambulatory Visit: Payer: Self-pay | Admitting: Family Medicine

## 2016-03-11 NOTE — Telephone Encounter (Signed)
Pt last refill on Klonopin was on 01/04/16. Pt last OV was on 10/06/15, pt next OV is on 07/09/16. Ok to refill?

## 2016-03-11 NOTE — Telephone Encounter (Signed)
Disregard refill request, rerouting to Dr. Caryl Bis.

## 2016-03-12 NOTE — Telephone Encounter (Signed)
faxed

## 2016-03-15 ENCOUNTER — Telehealth: Payer: Self-pay | Admitting: *Deleted

## 2016-03-15 NOTE — Telephone Encounter (Signed)
Pt has requested a medication refill for clonazepam  Pharmacy [facility] [address]

## 2016-03-15 NOTE — Telephone Encounter (Signed)
Last filled 03/12/16 90 1rf

## 2016-03-15 NOTE — Telephone Encounter (Signed)
Last OV 10/06/15 last filled

## 2016-03-18 ENCOUNTER — Other Ambulatory Visit: Payer: Self-pay | Admitting: Nurse Practitioner

## 2016-03-19 NOTE — Telephone Encounter (Signed)
Pt called about being told from the pharmacy that she cannot get a refill from pharmacy until 03/31/2016. Pt was told that she was given 90 day supply and pt states it was not 90 pills in the bottle. Please advise?  Call pt @ [phone]. Thank you!

## 2016-03-19 NOTE — Telephone Encounter (Signed)
Last filled 02/19/16  Next office visit Jenny Roberts  Last office visit 10/06/15 acute , not established please advise.

## 2016-03-19 NOTE — Telephone Encounter (Signed)
Patient states she does not believe they gave her 93 but the pharmacy informed her that she is unable to fill this until 03/31/16. Patient states she will go to pharmacy and pay cash price for this.

## 2016-03-20 NOTE — Telephone Encounter (Signed)
Sent to pharmacy 

## 2016-04-23 ENCOUNTER — Telehealth: Payer: Self-pay | Admitting: Family Medicine

## 2016-04-23 NOTE — Telephone Encounter (Signed)
Pt called about possibly having a UTI. Pt wants to know if something can be called in to the pharmacy? Please advise?  Pharmacy is [facility] [address] [place]  Call pt @ [phone]. Thank you!

## 2016-04-23 NOTE — Telephone Encounter (Signed)
Patient notified she will need an appointment, patient is scheduled for an appointment on 04/24/16. Patient notified to go to ER or urgent care if she developes a fever or worsening symptoms.

## 2016-04-24 ENCOUNTER — Ambulatory Visit (INDEPENDENT_AMBULATORY_CARE_PROVIDER_SITE_OTHER): Payer: Medicare Other | Admitting: Family Medicine

## 2016-04-24 ENCOUNTER — Encounter: Payer: Self-pay | Admitting: Family Medicine

## 2016-04-24 VITALS — BP 134/78 | HR 86 | Temp 98.3°F | Wt 155.6 lb

## 2016-04-24 DIAGNOSIS — R300 Dysuria: Secondary | ICD-10-CM

## 2016-04-24 DIAGNOSIS — R8290 Unspecified abnormal findings in urine: Secondary | ICD-10-CM

## 2016-04-24 DIAGNOSIS — N3000 Acute cystitis without hematuria: Secondary | ICD-10-CM | POA: Diagnosis not present

## 2016-04-24 LAB — POCT URINALYSIS DIPSTICK
Glucose, UA: NEGATIVE
LEUKOCYTES UA: NEGATIVE
NITRITE UA: POSITIVE
Protein, UA: 30
RBC UA: NEGATIVE
Spec Grav, UA: 1.025 (ref 1.030–1.035)
Urobilinogen, UA: 1 (ref ?–2.0)
pH, UA: 5 (ref 5.0–8.0)

## 2016-04-24 LAB — URINALYSIS, MICROSCOPIC ONLY: RBC / HPF: NONE SEEN (ref 0–?)

## 2016-04-24 MED ORDER — SULFAMETHOXAZOLE-TRIMETHOPRIM 800-160 MG PO TABS: 1.0000 | ORAL_TABLET | Freq: Two times a day (BID) | ORAL | 0 refills | Status: DC

## 2016-04-24 NOTE — Progress Notes (Signed)
Pre visit review using our clinic review tool, if applicable. No additional management support is needed unless otherwise documented below in the visit note. 

## 2016-04-24 NOTE — Assessment & Plan Note (Signed)
Patient's symptoms most consistent with UTI. Urinalysis with positive nitrites. Suspect UTI as cause. Benign exam. We'll treat with Bactrim given prior ESBL culture results. She was unable to provide enough urine for culture. She was given a urine sample cup to bring back later today or tomorrow with a urine sample to send for culture.

## 2016-04-24 NOTE — Patient Instructions (Signed)
Nice to see you. You likely have a UTI. We will treat you with Bactrim. If you develop abdominal pain, fevers, or worsening symptoms please seek medical attention immediately.

## 2016-04-24 NOTE — Progress Notes (Signed)
  [name], MD Phone: [phone]  Jenny Roberts is a 71 y.o. female who presents today for same-day visit.   Patient notes 4-5 days of urinary frequency, dysuria, and urgency. Notes mild discomfort in her suprapubic region. Mild heaviness in her back as well. No radiation of the back pain. No vaginal discharge. No fevers. No numbness or weakness. No loss of bowel or bladder function. No saddle anesthesia. She is status post hysterectomy and appendectomy. She took ciprofloxacin over the weekend that she had left over. She's been taking Azo as well which was beneficial. She last took Azo 24 hours ago.  PMH: Has history of UTI.   ROS see history of present illness  Objective  Physical Exam Vitals:   04/24/16 0945  BP: 134/78  Pulse: 86  Temp: 98.3 F (36.8 C)    BP Readings from Last 3 Encounters:  04/24/16 134/78  10/16/15 132/74  10/06/15 120/84   Wt Readings from Last 3 Encounters:  04/24/16 155 lb 9.6 oz (70.6 kg)  10/16/15 151 lb (68.5 kg)  10/06/15 151 lb 6.4 oz (68.7 kg)    Physical Exam  Constitutional: No distress.  Cardiovascular: Normal rate, regular rhythm and normal heart sounds.   Pulmonary/Chest: Effort normal and breath sounds normal.  Abdominal: Soft. Bowel sounds are normal. She exhibits no distension. There is no tenderness. There is no rebound and no guarding.  Musculoskeletal:  No midline spine tenderness, no midline spine step-off, no muscular back tenderness  Neurological:  5 out of 5 strength in bilateral quads, hamstrings, plantar flexion, and dorsiflexion, sensation to light touch intact in bilateral lower extremities  Skin: She is not diaphoretic.     Assessment/Plan: Please see individual problem list.  UTI (urinary tract infection) Patient's symptoms most consistent with UTI. Urinalysis with positive nitrites. Suspect UTI as cause. Benign exam. We'll treat with Bactrim given prior ESBL culture results. She was unable to provide  enough urine for culture. She was given a urine sample cup to bring back later today or tomorrow with a urine sample to send for culture.   Orders Placed This Encounter  Procedures  . Urine Culture    Standing Status:   Future    Standing Expiration Date:   04/24/2017  . Urine Microscopic Only    Standing Status:   Future    Standing Expiration Date:   04/24/2017  . POCT Urinalysis Dipstick    Meds ordered this encounter  Medications  . sulfamethoxazole-trimethoprim (BACTRIM DS,SEPTRA DS) 800-160 MG tablet    Sig: Take 1 tablet by mouth 2 (two) times daily.    Dispense:  6 tablet    Refill:  0    [name], MD [place]

## 2016-04-24 NOTE — Addendum Note (Signed)
Addended by: Leeanne Rio on: 04/24/2016 03:16 PM   Modules accepted: Orders

## 2016-04-25 LAB — URINE CULTURE: Organism ID, Bacteria: NO GROWTH

## 2016-07-09 ENCOUNTER — Ambulatory Visit: Payer: No Typology Code available for payment source

## 2016-08-12 ENCOUNTER — Ambulatory Visit: Payer: No Typology Code available for payment source

## 2016-08-12 ENCOUNTER — Other Ambulatory Visit: Payer: Self-pay

## 2016-08-12 DIAGNOSIS — Z1231 Encounter for screening mammogram for malignant neoplasm of breast: Secondary | ICD-10-CM

## 2016-08-16 ENCOUNTER — Ambulatory Visit (INDEPENDENT_AMBULATORY_CARE_PROVIDER_SITE_OTHER): Payer: Medicare Other

## 2016-08-16 VITALS — BP 122/80 | HR 65 | Temp 98.3°F | Resp 12 | Ht 61.0 in | Wt 153.8 lb

## 2016-08-16 DIAGNOSIS — Z0000 Encounter for general adult medical examination without abnormal findings: Secondary | ICD-10-CM | POA: Diagnosis not present

## 2016-08-16 DIAGNOSIS — Z23 Encounter for immunization: Secondary | ICD-10-CM

## 2016-08-16 DIAGNOSIS — Z1159 Encounter for screening for other viral diseases: Secondary | ICD-10-CM | POA: Diagnosis not present

## 2016-08-16 NOTE — Progress Notes (Signed)
Subjective:   Jenny Roberts is a 71 y.o. female who presents for Medicare Annual (Subsequent) preventive examination.  Review of Systems:  No ROS.  Medicare Wellness Visit. Additional risk factors are reflected in the social history. Cardiac Risk Factors include: advanced age (>54men, >35 women)     Objective:     Vitals: BP 122/80 (BP Location: Left Arm, Patient Position: Sitting, Cuff Size: Normal)   Pulse 65   Temp 98.3 F (36.8 C) (Oral)   Resp 12   Ht 5\' 1"  (1.549 m)   Wt 153 lb 12.8 oz (69.8 kg)   SpO2 95%   BMI 29.06 kg/m   Body mass index is 29.06 kg/m.   Tobacco History  Smoking Status  . Never Smoker  Smokeless Tobacco  . Never Used     Counseling given: Not Answered   Past Medical History:  Diagnosis Date  . Anxiety   . Breast cancer ([place]) 2001   Right. Treated with lumpectomy followed by Chemotherapy and Radiation therapy  . Depression   . Hyperlipidemia   . Skin cancer 09/2004   treated at [facility]  . Thyroid disease    Past Surgical History:  Procedure Laterality Date  . ABDOMINAL HYSTERECTOMY  1983   Left parts of each ovary   . BREAST BIOPSY Right 2001   positive  . BREAST EXCISIONAL BIOPSY Right 1990's   benign  . BREAST LUMPECTOMY Right 2001  . CARPAL TUNNEL RELEASE  2016  . COLONOSCOPY  2008  . KNEE SURGERY  2003  . mole excision  2006   Basal cell   . WRIST SURGERY Right 2010   Family History  Problem Relation Age of Onset  . Brain cancer Father   . Cancer Maternal Grandfather 51       Colon Cancer   History  Sexual Activity  . Sexual activity: Not Currently    Outpatient Encounter Prescriptions as of 08/16/2016  Medication Sig  . clonazePAM (KLONOPIN) 0.5 MG tablet TAKE ONE TABLET BY MOUTH ONCE DAILY  . famotidine (PEPCID) 20 MG tablet Take 20 mg by mouth 3 times/day as needed-between meals & bedtime for heartburn or indigestion.  . fluticasone (FLONASE) 50 MCG/ACT nasal spray Place 2 sprays into both nostrils daily.  (Patient taking differently: Place 2 sprays into both nostrils daily as needed for allergies. )  . levothyroxine (SYNTHROID, LEVOTHROID) 100 MCG tablet Take 1 tablet (100 mcg total) by mouth daily.  Marland Kitchen lovastatin (MEVACOR) 40 MG tablet Take 1 tablet (40 mg total) by mouth daily.  . sertraline (ZOLOFT) 100 MG tablet TAKE ONE TABLET BY MOUTH ONCE DAILY  . [DISCONTINUED] sulfamethoxazole-trimethoprim (BACTRIM DS,SEPTRA DS) 800-160 MG tablet Take 1 tablet by mouth 2 (two) times daily.   No facility-administered encounter medications on file as of 08/16/2016.     Activities of Daily Living In your present state of health, do you have any difficulty performing the following activities: 08/16/2016 09/18/2015  Hearing? N N  Vision? N N  Difficulty concentrating or making decisions? N N  Walking or climbing stairs? N N  Dressing or bathing? N N  Doing errands, shopping? N N  Preparing Food and eating ? N -  Using the Toilet? N -  In the past six months, have you accidently leaked urine? N -  Do you have problems with loss of bowel control? N -  Managing your Medications? N -  Managing your Finances? N -  Housekeeping or managing your Housekeeping? N -  Some recent data might be hidden    Patient Care Team: Leone Haven, MD as PCP - General (Family Medicine) Christene Lye, MD (General Surgery)    Assessment:    This is a routine wellness examination for Jenny Roberts. The goal of the wellness visit is to assist the patient how to close the gaps in care and create a preventative care plan for the patient.   The roster of all physicians providing medical care to patient is listed in the Snapshot section of the chart.  Taking calcium VIT D as appropriate/Osteoporosis risk reviewed.    Safety issues reviewed; Smoke and carbon monoxide detectors in the home. No firearms in the home.  Wears seatbelts when driving or riding with others. Patient does wear sunscreen or protective clothing  when in direct sunlight. No violence in the home.  Patient is alert, normal appearance, oriented to person/place/and time.  Correctly identified the president of the [place], recall of 3/3 words, and performing simple calculations. Displays appropriate judgement and can read correct time from watch face.   No new identified risk were noted.  No failures at ADL's or IADL's.    BMI- discussed the importance of a healthy diet, water intake and the benefits of aerobic exercise. Educational material provided.   24 hour diet recall: Breakfast: Oatmeal Lunch: Peanut butter sandwich Dinner: Potato soup Snack: Daily fluid intake: 0 cups of caffeine, 4-6 cups of water  Dental- every 12 months.  Eye- Visual acuity not assessed per patient preference since they have regular follow up with the ophthalmologist.  Wears corrective lenses.  Sleep patterns- Sleeps 8 hours at night.  Wakes feeling rested.   Pneumovax 23 vaccine administered L deltoid, tolerated well. Educational material provided.  TDAP vaccine deferred per patient preference.  Follow up with insurance.  Educational material provided.  Hepatitis C Screening discussed, lab drawn today.  Educational material provided.  Patient Concerns: None at this time. Follow up with PCP as needed.  Exercise Activities and Dietary recommendations Current Exercise Habits: Home exercise routine, Type of exercise: walking, Time (Minutes): 20, Frequency (Times/Week): 4, Weekly Exercise (Minutes/Week): 80, Intensity: Mild  Goals    . Increase lean proteins          Low carb foods    . Increase physical activity          Stay active and walk for exercise      Fall Risk Fall Risk  08/16/2016 07/10/2015 12/19/2014 06/15/2014  Falls in the past year? No No No No   Depression Screen PHQ 2/9 Scores 08/16/2016 07/10/2015 12/19/2014 06/15/2014  PHQ - 2 Score 0 0 0 0     Cognitive Function MMSE - Mini Mental State Exam 08/16/2016 07/10/2015    Orientation to time 5 5  Orientation to Place 5 5  Registration 3 3  Attention/ Calculation 5 5  Recall 3 3  Language- name 2 objects 2 2  Language- repeat 1 1  Language- follow 3 step command 3 3  Language- read & follow direction 1 1  Write a sentence 1 1  Copy design 1 1  Total score 30 30        Immunization History  Administered Date(s) Administered  . Influenza, High Dose Seasonal PF 10/06/2015  . Influenza-Unspecified 11/07/2014  . Pneumococcal Conjugate-13 07/10/2015  . Pneumococcal Polysaccharide-23 08/16/2016   Screening Tests Health Maintenance  Topic Date Due  . TETANUS/TDAP  04/08/1964  . INFLUENZA VACCINE  08/21/2016  . MAMMOGRAM  10/08/2017  .  COLONOSCOPY  08/07/2024  . DEXA SCAN  Completed  . Hepatitis C Screening  Completed  . PNA vac Low Risk Adult  Completed      Plan:   End of life planning; Advanced aging; Advanced directives discussed.  No HCPOA/Living Will.  Additional information provided to help them start the conversation with family.  Copy of HCPOA/Living Will requested upon completion. Time spent on this topic is 20 minutes.  I have personally reviewed and noted the following in the patient's chart:   . Medical and social history . Use of alcohol, tobacco or illicit drugs  . Current medications and supplements . Functional ability and status . Nutritional status . Physical activity . Advanced directives . List of other physicians . Hospitalizations, surgeries, and ER visits in previous 12 months . Vitals . Screenings to include cognitive, depression, and falls . Referrals and appointments  In addition, I have reviewed and discussed with patient certain preventive protocols, quality metrics, and best practice recommendations. A written personalized care plan for preventive services as well as general preventive health recommendations were provided to patient.     Varney Biles, LPN  [id number]/[id number]

## 2016-08-16 NOTE — Patient Instructions (Addendum)
  Jenny Roberts , Thank you for taking time to come for your Medicare Wellness Visit. I appreciate your ongoing commitment to your health goals. Please review the following plan we discussed and let me know if I can assist you in the future.   Follow up with Dr. Caryl Bis as needed.    Bring a copy of your [place] and/or Living Will to be scanned into chart once completed.  Have a great day!  These are the goals we discussed: Goals    . Increase lean proteins          Low carb foods    . Increase physical activity          Stay active and walk for exercise       This is a list of the screening recommended for you and due dates:  Health Maintenance  Topic Date Due  . Tetanus Vaccine  [birth year]08/1964  . Flu Shot  08/21/2016  . Mammogram  10/08/2017  . Colon Cancer Screening  08/07/2024  . DEXA scan (bone density measurement)  Completed  .  Hepatitis C: One time screening is recommended by Center for Disease Control  (CDC) for  adults born from [birth year] through 1965.   Completed  . Pneumonia vaccines  Completed

## 2016-08-17 LAB — HEPATITIS C ANTIBODY: HCV AB: NEGATIVE

## 2016-09-23 ENCOUNTER — Other Ambulatory Visit: Payer: Self-pay | Admitting: Family Medicine

## 2016-09-24 NOTE — Telephone Encounter (Signed)
Last OV was 04/24/16, LAst refill was 03/12/16, #90 with 1 refill.  Please advise in the absence of PCP. thanks

## 2016-09-26 NOTE — Telephone Encounter (Signed)
Script faxed to [facility]

## 2016-10-14 ENCOUNTER — Ambulatory Visit: Payer: Medicare Other | Admitting: General Surgery

## 2016-10-16 ENCOUNTER — Ambulatory Visit
Admission: RE | Admit: 2016-10-16 | Discharge: 2016-10-16 | Disposition: A | Discharge status: Home / Self Care | Payer: Medicare Other | Source: Ambulatory Visit | Attending: General Surgery | Admitting: General Surgery

## 2016-10-16 DIAGNOSIS — Z1231 Encounter for screening mammogram for malignant neoplasm of breast: Secondary | ICD-10-CM | POA: Insufficient documentation

## 2016-10-21 ENCOUNTER — Other Ambulatory Visit: Payer: Self-pay | Admitting: Family Medicine

## 2016-10-28 ENCOUNTER — Ambulatory Visit (INDEPENDENT_AMBULATORY_CARE_PROVIDER_SITE_OTHER): Payer: Medicare Other | Admitting: General Surgery

## 2016-10-28 ENCOUNTER — Encounter: Payer: Self-pay | Admitting: *Deleted

## 2016-10-28 ENCOUNTER — Encounter: Payer: Self-pay | Admitting: General Surgery

## 2016-10-28 ENCOUNTER — Other Ambulatory Visit
Admission: RE | Admit: 2016-10-28 | Discharge: 2016-10-28 | Disposition: A | Discharge status: Home / Self Care | Payer: Medicare Other | Source: Ambulatory Visit | Attending: General Surgery | Admitting: General Surgery

## 2016-10-28 VITALS — BP 124/76 | HR 70 | Resp 12 | Ht 60.0 in | Wt 153.0 lb

## 2016-10-28 DIAGNOSIS — Z853 Personal history of malignant neoplasm of breast: Secondary | ICD-10-CM

## 2016-10-28 DIAGNOSIS — Z1211 Encounter for screening for malignant neoplasm of colon: Secondary | ICD-10-CM | POA: Diagnosis not present

## 2016-10-28 NOTE — Patient Instructions (Signed)
Colonoscopy, Adult A colonoscopy is an exam to look at the entire large intestine. During the exam, a lubricated, bendable tube is inserted into the anus and then passed into the rectum, colon, and other parts of the large intestine. A colonoscopy is often done as a part of normal colorectal screening or in response to certain symptoms, such as anemia, persistent diarrhea, abdominal pain, and blood in the stool. The exam can help screen for and diagnose medical problems, including:  Tumors.  Polyps.  Inflammation.  Areas of bleeding.  Tell a health care provider about:  Any allergies you have.  All medicines you are taking, including vitamins, herbs, eye drops, creams, and over-the-counter medicines.  Any problems you or family members have had with anesthetic medicines.  Any blood disorders you have.  Any surgeries you have had.  Any medical conditions you have.  Any problems you have had passing stool. What are the risks? Generally, this is a safe procedure. However, problems may occur, including:  Bleeding.  A tear in the intestine.  A reaction to medicines given during the exam.  Infection (rare).  What happens before the procedure? Eating and drinking restrictions Follow instructions from your health care provider about eating and drinking, which may include:  A few days before the procedure - follow a low-fiber diet. Avoid nuts, seeds, dried fruit, raw fruits, and vegetables.  1-3 days before the procedure - follow a clear liquid diet. Drink only clear liquids, such as clear broth or bouillon, black coffee or tea, clear juice, clear soft drinks or sports drinks, gelatin dessert, and popsicles. Avoid any liquids that contain red or purple dye.  On the day of the procedure - do not eat or drink anything during the 2 hours before the procedure, or within the time period that your health care provider recommends.  Bowel prep If you were prescribed an oral bowel prep  to clean out your colon:  Take it as told by your health care provider. Starting the day before your procedure, you will need to drink a large amount of medicated liquid. The liquid will cause you to have multiple loose stools until your stool is almost clear or light green.  If your skin or anus gets irritated from diarrhea, you may use these to relieve the irritation: ? Medicated wipes, such as adult wet wipes with aloe and vitamin E. ? A skin soothing-product like petroleum jelly.  If you vomit while drinking the bowel prep, take a break for up to 60 minutes and then begin the bowel prep again. If vomiting continues and you cannot take the bowel prep without vomiting, call your health care provider.  General instructions  Ask your health care provider about changing or stopping your regular medicines. This is especially important if you are taking diabetes medicines or blood thinners.  Plan to have someone take you home from the hospital or clinic. What happens during the procedure?  An IV tube may be inserted into one of your veins.  You will be given medicine to help you relax (sedative).  To reduce your risk of infection: ? Your health care team will wash or sanitize their hands. ? Your anal area will be washed with soap.  You will be asked to lie on your side with your knees bent.  Your health care provider will lubricate a long, thin, flexible tube. The tube will have a camera and a light on the end.  The tube will be inserted into your   anus.  The tube will be gently eased through your rectum and colon.  Air will be delivered into your colon to keep it open. You may feel some pressure or cramping.  The camera will be used to take images during the procedure.  A small tissue sample may be removed from your body to be examined under a microscope (biopsy). If any potential problems are found, the tissue will be sent to a lab for testing.  If small polyps are found, your  health care provider may remove them and have them checked for cancer cells.  The tube that was inserted into your anus will be slowly removed. The procedure may vary among health care providers and hospitals. What happens after the procedure?  Your blood pressure, heart rate, breathing rate, and blood oxygen level will be monitored until the medicines you were given have worn off.  Do not drive for 24 hours after the exam.  You may have a small amount of blood in your stool.  You may pass gas and have mild abdominal cramping or bloating due to the air that was used to inflate your colon during the exam.  It is up to you to get the results of your procedure. Ask your health care provider, or the department performing the procedure, when your results will be ready. This information is not intended to replace advice given to you by your health care provider. Make sure you discuss any questions you have with your health care provider. Document Released: 01/05/2000 Document Revised: 11/08/2015 Document Reviewed: 03/21/2015 Elsevier Interactive Patient Education  2018 [employer]  

## 2016-10-28 NOTE — Progress Notes (Signed)
Patient is due for a screening colonoscopy per Dr. Jamal Collin.   The patient was given dates and will call the office back to arrange.   Patient has colonoscopy instructions. Will need to send in prescription for Miralax once date is arranged.

## 2016-10-28 NOTE — Progress Notes (Signed)
Patient ID: Jenny Roberts, female   DOB: Jun 28, 1945, 71 y.o.   MRN: [id number]  Chief Complaint  Patient presents with  . Follow-up    HPI Jenny Roberts is a 71 y.o. female who presents for a breast cancer follow up. The most recent mammogram was done on 10/16/2016. Patient does perform regular self breast checks and gets regular mammograms done.    Marland KitchenHPI  Past Medical History:  Diagnosis Date  . Anxiety   . Breast cancer ([place]) 2001   Right. Treated with lumpectomy followed by Chemotherapy and Radiation therapy  . Depression   . Hyperlipidemia   . Skin cancer 09/2004   treated at [facility]  . Thyroid disease     Past Surgical History:  Procedure Laterality Date  . ABDOMINAL HYSTERECTOMY  1983   Left parts of each ovary   . BREAST BIOPSY Right 2001   positive  . BREAST EXCISIONAL BIOPSY Right 1990's   benign  . BREAST LUMPECTOMY Right 2001  . CARPAL TUNNEL RELEASE  2016  . COLONOSCOPY  2008  . KNEE SURGERY  2003  . mole excision  2006   Basal cell   . WRIST SURGERY Right 2010    Family History  Problem Relation Age of Onset  . Brain cancer Father   . Cancer Maternal Grandfather 52       Colon Cancer    Social History Social History  Substance Use Topics  . Smoking status: Never Smoker  . Smokeless tobacco: Never Used  . Alcohol use No    Allergies  Allergen Reactions  . Amoxicillin-Pot Clavulanate Diarrhea and Other (See Comments)    Has patient had a PCN reaction causing immediate rash, facial/tongue/throat swelling, SOB or lightheadedness with hypotension: No Has patient had a PCN reaction causing severe rash involving mucus membranes or skin necrosis: No Has patient had a PCN reaction that required hospitalization No Has patient had a PCN reaction occurring within the last 10 years: Yes If all of the above answers are "NO", then may proceed with Cephalosporin use.   . Codeine Hives and Other (See Comments)    shakes  . Latex Rash    Current  Outpatient Prescriptions  Medication Sig Dispense Refill  . clonazePAM (KLONOPIN) 0.5 MG tablet TAKE 1 TABLET BY MOUTH ONCE DAILY 90 tablet 1  . famotidine (PEPCID) 20 MG tablet Take 20 mg by mouth 3 times/day as needed-between meals & bedtime for heartburn or indigestion.    . fluticasone (FLONASE) 50 MCG/ACT nasal spray Place 2 sprays into both nostrils daily. (Patient taking differently: Place 2 sprays into both nostrils daily as needed for allergies. ) 16 g 6  . levothyroxine (SYNTHROID, LEVOTHROID) 100 MCG tablet TAKE ONE TABLET BY MOUTH ONCE DAILY 90 tablet 3  . lovastatin (MEVACOR) 40 MG tablet Take 1 tablet (40 mg total) by mouth daily. 90 tablet 3  . sertraline (ZOLOFT) 100 MG tablet TAKE ONE TABLET BY MOUTH ONCE DAILY 90 tablet 3   No current facility-administered medications for this visit.     Review of Systems Review of Systems  Constitutional: Negative.   Respiratory: Negative.   Cardiovascular: Negative.     Blood pressure 124/76, pulse 70, resp. rate 12, height 5' (1.524 m), weight 153 lb (69.4 kg).  Physical Exam Physical Exam  Constitutional: She is oriented to person, place, and time. She appears well-developed and well-nourished.  Eyes: Conjunctivae are normal. No scleral icterus.  Neck: Neck supple.  Cardiovascular: Normal rate, regular  rhythm and normal heart sounds.   Pulmonary/Chest: Effort normal and breath sounds normal. Right breast exhibits no inverted nipple, no mass, no nipple discharge, no skin change and no tenderness. Left breast exhibits no inverted nipple, no mass, no nipple discharge, no skin change and no tenderness.    Abdominal: Soft. There is no hepatomegaly. There is no tenderness.  Lymphadenopathy:    She has no cervical adenopathy.    She has no axillary adenopathy.  Neurological: She is alert and oriented to person, place, and time.  Skin: Skin is warm and dry.    Data Reviewed Prior notes and recent mammogram reviewed -  stable  Assessment    Right breast cancer s/p lumpectomy and chemo/radiation - 17 years out - mammogram is stable with no signs of malignancy. CA 27.29 last year was normal.   Most recent colonoscopy was 2008 and patient is due for one this year. Discussed its diagnostic and therapeutic benefits and patient will decide if she would like to schedule the procedure.     Plan    Will redraw CA 27.29 today. Patient to get an annual bilateral screening mammogram and breast exam with Dr. Bary Castilla  Colonoscopy with possible biopsy/polypectomy prn: Information regarding the procedure, including its potential risks and complications (including but not limited to perforation of the bowel, which may require emergency surgery to repair, and bleeding) was verbally given to the patient. Educational information regarding lower intestinal endoscopy was given to the patient. Written instructions for how to complete the bowel prep using Miralax were provided. The importance of drinking ample fluids to avoid dehydration as a result of the prep emphasized.  HPI, Physical Exam, Assessment and Plan have been scribed under the direction and in the presence of Mckinley Jewel, MD  Gaspar Cola, CMA    I have completed the exam and reviewed the above documentation for accuracy and completeness.  I agree with the above.  Haematologist has been used and any errors in dictation or transcription are unintentional.  Nester Bachus G. Jamal Collin, M.D., F.A.C.S.    Junie Panning G 10/28/2016, 9:49 AM

## 2016-10-29 ENCOUNTER — Telehealth: Payer: Self-pay | Admitting: *Deleted

## 2016-10-29 ENCOUNTER — Telehealth: Payer: Self-pay

## 2016-10-29 LAB — CANCER ANTIGEN 27.29: CA 27.29: 34.2 U/mL (ref 0.0–38.6)

## 2016-10-29 MED ORDER — POLYETHYLENE GLYCOL 3350 17 GM/SCOOP PO POWD: 1.0000 | Freq: Once | ORAL | 0 refills | Status: AC

## 2016-10-29 NOTE — Telephone Encounter (Signed)
Patient called to schedule her colonoscopy. The patient is scheduled for a Colonoscopy at [facility] on 12/18/2016. They are aware to call the day before to get their arrival time. Miralax prescription has been sent into the patient's pharmacy. The patient is aware of date and instructions.

## 2016-10-29 NOTE — Telephone Encounter (Signed)
Notified patient as instructed, patient pleased. Discussed follow-up appointments, patient agrees  

## 2016-10-29 NOTE — Telephone Encounter (Signed)
-----   Message from Christene Lye, MD sent at 10/29/2016 10:26 AM EDT ----- Normal value

## 2016-11-18 ENCOUNTER — Other Ambulatory Visit: Payer: Self-pay | Admitting: Family Medicine

## 2016-12-11 ENCOUNTER — Other Ambulatory Visit: Payer: Self-pay | Admitting: General Surgery

## 2016-12-11 DIAGNOSIS — Z1211 Encounter for screening for malignant neoplasm of colon: Secondary | ICD-10-CM

## 2016-12-17 ENCOUNTER — Telehealth: Payer: Self-pay

## 2016-12-17 NOTE — Telephone Encounter (Signed)
Patient called and needs to reschedule her colonoscopy scheduled for 12/18/16. She has come down with a bad cold and very sore throat. The patient is scheduled for a Colonoscopy at [facility] on 01/07/17. They are aware to call the day before to get their arrival time. She has already picked up her Miralax prescription. The patient is aware of date and instructions.  Endoscopy has been made aware of the new date.

## 2016-12-18 ENCOUNTER — Ambulatory Visit: Admission: RE | Admit: 2016-12-18 | Payer: Medicare Other | Source: Ambulatory Visit | Admitting: General Surgery

## 2016-12-18 ENCOUNTER — Encounter: Admission: RE | Payer: Self-pay | Source: Ambulatory Visit

## 2016-12-18 SURGERY — COLONOSCOPY WITH PROPOFOL
Anesthesia: General

## 2017-01-02 ENCOUNTER — Telehealth: Payer: Self-pay | Admitting: *Deleted

## 2017-01-02 NOTE — Telephone Encounter (Signed)
Patient called the office wanting to cancel colonoscopy that was scheduled for 01-07-17 with Dr. Jamal Collin due to the snow messing up her Holiday plans.   The patient is aware that Dr. Jamal Collin is retiring the end of this year. She was offered to get her colonoscopy rescheduled to 01-15-17 with Dr. Jamal Collin but declined.   The patient states that she does not want to reschedule at this time and will contact the office after the beginning of the year to reschedule with Dr. Bary Castilla. A brief pre-op visit will be required with Dr. Bary Castilla. *Patient states insurance will be changing after the first of the year as well.

## 2017-01-07 ENCOUNTER — Other Ambulatory Visit: Payer: Self-pay

## 2017-01-07 ENCOUNTER — Ambulatory Visit: Admission: RE | Admit: 2017-01-07 | Payer: Medicare Other | Source: Ambulatory Visit | Admitting: General Surgery

## 2017-01-07 ENCOUNTER — Encounter: Admission: RE | Payer: Self-pay | Source: Ambulatory Visit

## 2017-01-07 SURGERY — COLONOSCOPY WITH PROPOFOL
Anesthesia: General

## 2017-01-07 MED ORDER — SERTRALINE HCL 100 MG PO TABS
100.0000 mg | ORAL_TABLET | Freq: Every day | ORAL | 0 refills | Status: DC
Start: 2017-01-07 — End: 2017-09-16

## 2017-01-07 MED ORDER — CLONAZEPAM 0.5 MG PO TABS: 0.5000 mg | ORAL_TABLET | Freq: Every day | ORAL | 0 refills | Status: DC

## 2017-01-07 NOTE — Telephone Encounter (Signed)
Ok to refill. She needs a follow-up visit. Thanks.

## 2017-01-07 NOTE — Telephone Encounter (Signed)
Last OV 04/24/16 last filled  Clonazepam 09/26/16 90 1rf by Dr.Cook Sertraline 03/20/16 90 3rf

## 2017-01-08 NOTE — Telephone Encounter (Signed)
Left message to return call to schedule a follow up appointment with Dr.Sonnenberg, ok for pec to schedule

## 2017-02-28 ENCOUNTER — Other Ambulatory Visit: Payer: Self-pay

## 2017-02-28 ENCOUNTER — Encounter: Payer: Self-pay | Admitting: Family Medicine

## 2017-02-28 ENCOUNTER — Ambulatory Visit (INDEPENDENT_AMBULATORY_CARE_PROVIDER_SITE_OTHER): Payer: Medicare Other | Admitting: Family Medicine

## 2017-02-28 VITALS — BP 136/82 | HR 71 | Temp 98.2°F | Wt 152.0 lb

## 2017-02-28 DIAGNOSIS — R7309 Other abnormal glucose: Secondary | ICD-10-CM

## 2017-02-28 DIAGNOSIS — E039 Hypothyroidism, unspecified: Secondary | ICD-10-CM

## 2017-02-28 DIAGNOSIS — E785 Hyperlipidemia, unspecified: Secondary | ICD-10-CM

## 2017-02-28 DIAGNOSIS — F411 Generalized anxiety disorder: Secondary | ICD-10-CM

## 2017-02-28 DIAGNOSIS — Z853 Personal history of malignant neoplasm of breast: Secondary | ICD-10-CM

## 2017-02-28 DIAGNOSIS — E559 Vitamin D deficiency, unspecified: Secondary | ICD-10-CM

## 2017-02-28 DIAGNOSIS — Z23 Encounter for immunization: Secondary | ICD-10-CM

## 2017-02-28 LAB — COMPREHENSIVE METABOLIC PANEL
ALT: 18 U/L (ref 0–35)
AST: 19 U/L (ref 0–37)
Albumin: 4.3 g/dL (ref 3.5–5.2)
Alkaline Phosphatase: 86 U/L (ref 39–117)
BILIRUBIN TOTAL: 0.3 mg/dL (ref 0.2–1.2)
BUN: 27 mg/dL — ABNORMAL HIGH (ref 6–23)
CO2: 28 meq/L (ref 19–32)
Calcium: 9.4 mg/dL (ref 8.4–10.5)
Chloride: 106 mEq/L (ref 96–112)
Creatinine, Ser: 0.8 mg/dL (ref 0.40–1.20)
GFR: 74.96 mL/min (ref >60.00)
GLUCOSE: 117 mg/dL — AB (ref 70–99)
Potassium: 4.3 mEq/L (ref 3.5–5.1)
Sodium: 139 mEq/L (ref 135–145)
Total Protein: 6.7 g/dL (ref 6.0–8.3)

## 2017-02-28 LAB — TSH: TSH: 0.82 u[IU]/mL (ref 0.35–4.50)

## 2017-02-28 LAB — LIPID PANEL
CHOL/HDL RATIO: 3
Cholesterol: 145 mg/dL (ref 0–200)
HDL: 43.6 mg/dL (ref >39.00)
LDL Cholesterol: 87 mg/dL (ref 0–99)
NONHDL: 100.97
Triglycerides: 68 mg/dL (ref 0.0–149.0)
VLDL: 13.6 mg/dL (ref 0.0–40.0)

## 2017-02-28 LAB — VITAMIN D 25 HYDROXY (VIT D DEFICIENCY, FRACTURES): VITD: 36.71 ng/mL (ref 30.00–100.00)

## 2017-02-28 NOTE — Assessment & Plan Note (Signed)
Stable  Continue current regimen  

## 2017-02-28 NOTE — Progress Notes (Signed)
  [name], MD Phone: [phone]  Jenny Roberts is a 72 y.o. female who presents today for f/u.  HYPOTHYROIDISM Disease Monitoring Weight changes: no  Skin Changes: no Heat/Cold intolerance: cold intolerance  Medication Monitoring Compliance:  Taking synthroid   Last TSH:   Lab Results  Component Value Date   TSH 8.86 (H) 07/05/2015   HYPERLIPIDEMIA Symptoms Chest pain on exertion:  no    Medications: Compliance- taking lovastatin Right upper quadrant pain- no  Muscle aches- no Tries to stay active.  Eats a healthy diet.  Anxiety: Notes this is pretty stable.  Takes Klonopin nightly to help sleep and for anxiety.  On Zoloft.  No depression.  She is taking vitamin D supplementation.    Social History   Tobacco Use  Smoking Status Never Smoker  Smokeless Tobacco Never Used     ROS see history of present illness  Objective  Physical Exam Vitals:   02/28/17 1023  BP: 136/82  Pulse: 71  Temp: 98.2 F (36.8 C)  SpO2: 95%    BP Readings from Last 3 Encounters:  02/28/17 136/82  10/28/16 124/76  08/16/16 122/80   Wt Readings from Last 3 Encounters:  02/28/17 152 lb (68.9 kg)  10/28/16 153 lb (69.4 kg)  08/16/16 153 lb 12.8 oz (69.8 kg)    Physical Exam  Constitutional: No distress.  Neck: No thyromegaly present.  Cardiovascular: Normal rate, regular rhythm and normal heart sounds.  Pulmonary/Chest: Effort normal and breath sounds normal.  Musculoskeletal: She exhibits no edema.  Neurological: She is alert. Gait normal.  Skin: Skin is warm and dry. She is not diaphoretic.     Assessment/Plan: Please see individual problem list.  Vitamin D deficiency Check vitamin D  Hypothyroidism Check TSH.  Continue Synthroid.  Hyperlipidemia Check lipid panel.  Continue lovastatin.  Personal history of malignant neoplasm of breast Followed by general surgery.  She will continue to see them.  Anxiety state Stable.  Continue current  regimen.   Orders Placed This Encounter  Procedures  . Flu vaccine HIGH DOSE PF  . Comp Met (CMET)  . Lipid panel  . TSH  . HgB A1c  . Vitamin D (25 hydroxy)    No orders of the defined types were placed in this encounter.  Discussed obtaining Tdap at the pharmacy given her lack of tetanus vaccination in the last 10 years.  Discussed that the pertussis aspect would be beneficial for her given that she is around her great grandchildren.  [name], MD [place]

## 2017-02-28 NOTE — Assessment & Plan Note (Signed)
Check vitamin D. 

## 2017-02-28 NOTE — Patient Instructions (Signed)
Nice to see you. We will check some lab work today and contact you with the results. 

## 2017-02-28 NOTE — Addendum Note (Signed)
Addended by: Arby Barrette on: 02/28/2017 12:36 PM   Modules accepted: Orders

## 2017-02-28 NOTE — Assessment & Plan Note (Signed)
Followed by general surgery.  She will continue to see them.

## 2017-02-28 NOTE — Assessment & Plan Note (Signed)
Check lipid panel.  Continue lovastatin. 

## 2017-02-28 NOTE — Assessment & Plan Note (Signed)
Check TSH.  Continue Synthroid. 

## 2017-03-01 LAB — HEMOGLOBIN A1C
EAG (MMOL/L): 5.7 (calc)
HEMOGLOBIN A1C: 5.2 %{Hb} (ref <5.7)
MEAN PLASMA GLUCOSE: 103 (calc)

## 2017-03-13 ENCOUNTER — Ambulatory Visit: Payer: No Typology Code available for payment source | Admitting: Family Medicine

## 2017-06-13 ENCOUNTER — Other Ambulatory Visit: Payer: Self-pay | Admitting: Family Medicine

## 2017-06-13 NOTE — Telephone Encounter (Signed)
Copied from [place] [id number]. Topic: Quick Communication - Rx Refill/Question >> Jun 13, 2017 11:26 AM Clack, Janett Billow D wrote: Medication: clonazePAM (KLONOPIN) 0.5 MG tablet [[id number]]   Has the patient contacted their pharmacy? Yes.   (Agent: If no, request that the patient contact the pharmacy for the refill.) (Agent: If yes, when and what did the pharmacy advise?)  Preferred Pharmacy (with phone number or street name): [id number] - [address], [place] [phone] (Phone) [phone] (Fax)  *New pharmacy  Agent: Please be advised that RX refills may take up to 3 business days. We ask that you follow-up with your pharmacy.

## 2017-06-13 NOTE — Telephone Encounter (Signed)
Would you like to refill? 

## 2017-06-13 NOTE — Telephone Encounter (Signed)
Rx refill request: Klonopin 0.5 mg         Last filled: 01/07/17 #90  LOV: 02/28/17  PCP: [place]: verified

## 2017-06-14 MED ORDER — CLONAZEPAM 0.5 MG PO TABS: 0.5000 mg | ORAL_TABLET | Freq: Every day | ORAL | 1 refills | Status: DC

## 2017-06-17 ENCOUNTER — Telehealth: Payer: Self-pay | Admitting: Family Medicine

## 2017-06-17 NOTE — Telephone Encounter (Signed)
Copied from [place] [id number]. Topic: Quick Communication - Rx Refill/Question >> Jun 17, 2017  6:15 PM Tye [place] wrote: Medication: clonazePAM (KLONOPIN) 0.5 MG tablet [[id number]]   Has the patient contacted their pharmacy? Yes.   (Agent: If no, request that the patient contact the pharmacy for the refill.) (Agent: If yes, when and what did the pharmacy advise?)  [facility]  Agent: Please be advised that RX refills may take up to 3 business days. We ask that you follow-up with your pharmacy.

## 2017-08-18 ENCOUNTER — Ambulatory Visit (INDEPENDENT_AMBULATORY_CARE_PROVIDER_SITE_OTHER): Payer: Medicare Other

## 2017-08-18 VITALS — BP 118/72 | HR 70 | Temp 98.1°F | Resp 14 | Ht 60.0 in | Wt 151.8 lb

## 2017-08-18 DIAGNOSIS — Z0000 Encounter for general adult medical examination without abnormal findings: Secondary | ICD-10-CM

## 2017-08-18 NOTE — Patient Instructions (Addendum)
  Jenny Roberts , Thank you for taking time to come for your Medicare Wellness Visit. I appreciate your ongoing commitment to your health goals. Please review the following plan we discussed and let me know if I can assist you in the future.   These are the goals we discussed: Goals    . Low carb diet     Protein supplemental drink as a meal or as a snack.  Portion control sweet intake.         This is a list of the screening recommended for you and due dates:  Health Maintenance  Topic Date Due  . Tetanus Vaccine  04/08/1964  . Flu Shot  08/21/2017  . Mammogram  10/16/2017  . Colon Cancer Screening  08/07/2024  . DEXA scan (bone density measurement)  Completed  .  Hepatitis C: One time screening is recommended by Center for Disease Control  (CDC) for  adults born from [birth year] through 1965.   Completed  . Pneumonia vaccines  Completed

## 2017-08-18 NOTE — Progress Notes (Signed)
Subjective:   Jenny Roberts is a 72 y.o. female who presents for Medicare Annual (Subsequent) preventive examination.  Review of Systems:  No ROS.  Medicare Wellness Visit. Additional risk factors are reflected in the social history.  Cardiac Risk Factors include: advanced age (>14men, >6 women)     Objective:     Vitals: BP 118/72 (BP Location: Left Arm, Patient Position: Sitting, Cuff Size: Normal)   Pulse 70   Temp 98.1 F (36.7 C) (Oral)   Resp 14   Ht 5' (1.524 m)   Wt 151 lb 12.8 oz (68.9 kg)   SpO2 93%   BMI 29.65 kg/m   Body mass index is 29.65 kg/m.  Advanced Directives 08/18/2017 08/16/2016 09/17/2015 07/10/2015  Does Patient Have a Medical Advance Directive? No No No No  Does patient want to make changes to medical advance directive? No - Patient declined - - -  Would patient like information on creating a medical advance directive? - Yes (MAU/Ambulatory/Procedural Areas - Information given) Yes - Educational materials given (No Data)    Tobacco Social History   Tobacco Use  Smoking Status Never Smoker  Smokeless Tobacco Never Used     Counseling given: Not Answered   Clinical Intake:  Pre-visit preparation completed: Yes  Pain : No/denies pain     Nutritional Status: BMI 25 -29 Overweight Diabetes: No  How often do you need to have someone help you when you read instructions, pamphlets, or other written materials from your doctor or pharmacy?: 1 - Never  Interpreter Needed?: No     Past Medical History:  Diagnosis Date  . Anxiety   . Breast cancer ([place]) 2001   Right. Treated with lumpectomy followed by Chemotherapy and Radiation therapy  . Depression   . Hyperlipidemia   . Nontraumatic rupture of tendon of thumb 06/15/2014  . Skin cancer 09/2004   treated at [facility]  . Thyroid disease    Past Surgical History:  Procedure Laterality Date  . ABDOMINAL HYSTERECTOMY  1983   Left parts of each ovary   . BREAST BIOPSY Right 2001   positive  . BREAST EXCISIONAL BIOPSY Right 1990's   benign  . BREAST LUMPECTOMY Right 2001  . CARPAL TUNNEL RELEASE  2016  . COLONOSCOPY  2008  . KNEE SURGERY  2003  . mole excision  2006   Basal cell   . WRIST SURGERY Right 2010   Family History  Problem Relation Age of Onset  . Brain cancer Father   . Cancer Maternal Grandfather 66       Colon Cancer   Social History   Socioeconomic History  . Marital status: Married    Spouse name: Not on file  . Number of children: Not on file  . Years of education: Not on file  . Highest education level: Not on file  Occupational History  . Not on file  Social Needs  . Financial resource strain: Not hard at all  . Food insecurity:    Worry: Never true    Inability: Never true  . Transportation needs:    Medical: No    Non-medical: No  Tobacco Use  . Smoking status: Never Smoker  . Smokeless tobacco: Never Used  Substance and Sexual Activity  . Alcohol use: No    Alcohol/week: 0.0 oz  . Drug use: No  . Sexual activity: Not Currently  Lifestyle  . Physical activity:    Days per week: 0 days    Minutes  per session: Not on file  . Stress: Not at all  Relationships  . Social connections:    Talks on phone: Not on file    Gets together: Not on file    Attends religious service: Not on file    Active member of club or organization: Not on file    Attends meetings of clubs or organizations: Not on file    Relationship status: Not on file  Other Topics Concern  . Not on file  Social History Narrative   She is a homemaker and babysits   Children- 2 daughters    8 grandchildren, 3 great grandchildren    Pets: None   Caffeine- Decaf coffee, rare Dr. Malachi Bonds    Enjoys- gardening, cross stitch, and quilt    Outpatient Encounter Medications as of 08/18/2017  Medication Sig  . cholecalciferol (VITAMIN D) 1000 units tablet Take 1,000 Units by mouth daily.  . clonazePAM (KLONOPIN) 0.5 MG tablet Take 1 tablet (0.5 mg total) by  mouth daily.  . famotidine (PEPCID) 20 MG tablet Take 20 mg by mouth 3 times/day as needed-between meals & bedtime for heartburn or indigestion.  . fluticasone (FLONASE) 50 MCG/ACT nasal spray Place 2 sprays into both nostrils daily. (Patient taking differently: Place 2 sprays into both nostrils daily as needed for allergies. )  . levothyroxine (SYNTHROID, LEVOTHROID) 100 MCG tablet TAKE ONE TABLET BY MOUTH ONCE DAILY  . lovastatin (MEVACOR) 40 MG tablet TAKE ONE TABLET BY MOUTH ONCE DAILY  . sertraline (ZOLOFT) 100 MG tablet Take 1 tablet (100 mg total) by mouth daily.   No facility-administered encounter medications on file as of 08/18/2017.     Activities of Daily Living In your present state of health, do you have any difficulty performing the following activities: 08/18/2017  Hearing? N  Vision? N  Difficulty concentrating or making decisions? N  Walking or climbing stairs? N  Dressing or bathing? N  Doing errands, shopping? N  Preparing Food and eating ? N  Using the Toilet? N  In the past six months, have you accidently leaked urine? N  Do you have problems with loss of bowel control? N  Managing your Medications? N  Managing your Finances? N  Housekeeping or managing your Housekeeping? N  Some recent data might be hidden    Patient Care Team: Leone Haven, MD as PCP - General (Family Medicine) Christene Lye, MD (General Surgery)    Assessment:   This is a routine wellness examination for Jenny Roberts. The goal of the wellness visit is to assist the patient how to close the gaps in care and create a preventative care plan for the patient.   The roster of all physicians providing medical care to patient is listed in the Snapshot section of the chart.  Taking calcium VIT D as appropriate/Osteoporosis risk reviewed.    Safety issues reviewed; Smoke and carbon monoxide detectors in the home. No firearms in the home. Wears seatbelts when driving or riding with  others. No violence in the home.  They do not have excessive sun exposure.  Discussed the need for sun protection: hats, long sleeves and the use of sunscreen if there is significant sun exposure.  Patient is alert, normal appearance, oriented to person/place/and time. Correctly identified the president of the [place] and recalls of 3/3 words.Performs simple calculations and can read correct time from watch face. Displays appropriate judgement.  No new identified risk were noted.  No failures at ADL's or IADL's.  BMI- discussed the importance of a healthy diet, water intake and the benefits of aerobic exercise. Educational material provided.   24 hour diet recall: Low carb, lean meat  Dental- UTD  Sleep patterns- Sleeps 8 hours or more at night.  Wakes feeling rested.   TDAP vaccine discussed.  She plans to go to her local pharmacy to receive.  She agrees to notify pcp upon completion.   Patient Concerns: None at this time. Follow up with PCP as needed.  Exercise Activities and Dietary recommendations Current Exercise Habits: The patient does not participate in regular exercise at present  Goals    . Low carb diet     Protein supplemental drink as a meal or as a snack.  Portion control sweet intake.         Fall Risk Fall Risk  08/18/2017 08/16/2016 07/10/2015 12/19/2014 06/15/2014  Falls in the past year? No No No No No   Depression Screen PHQ 2/9 Scores 08/18/2017 08/16/2016 07/10/2015 12/19/2014  PHQ - 2 Score 0 0 0 0     Cognitive Function MMSE - Mini Mental State Exam 08/18/2017 08/16/2016 07/10/2015  Orientation to time 5 5 5   Orientation to Place 5 5 5   Registration 3 3 3   Attention/ Calculation 5 5 5   Recall 3 3 3   Language- name 2 objects 2 2 2   Language- repeat 1 1 1   Language- follow 3 step command 3 3 3   Language- read & follow direction 1 1 1   Write a sentence 1 1 1   Copy design 1 1 1   Total score 30 30 30         Immunization History  Administered  Date(s) Administered  . Influenza, High Dose Seasonal PF 10/06/2015, 02/28/2017  . Influenza-Unspecified 11/07/2014  . Pneumococcal Conjugate-13 07/10/2015  . Pneumococcal Polysaccharide-23 08/16/2016   Screening Tests Health Maintenance  Topic Date Due  . TETANUS/TDAP  04/08/1964  . INFLUENZA VACCINE  08/21/2017  . MAMMOGRAM  10/16/2017  . COLONOSCOPY  08/07/2024  . DEXA SCAN  Completed  . Hepatitis C Screening  Completed  . PNA vac Low Risk Adult  Completed      Plan:   End of life planning; Advanced aging; Advanced directives discussed.  No HCPOA/Living Will.  Additional information declined at this time.  I have personally reviewed and noted the following in the patient's chart:   . Medical and social history . Use of alcohol, tobacco or illicit drugs  . Current medications and supplements . Functional ability and status . Nutritional status . Physical activity . Advanced directives . List of other physicians . Hospitalizations, surgeries, and ER visits in previous 12 months . Vitals . Screenings to include cognitive, depression, and falls . Referrals and appointments  In addition, I have reviewed and discussed with patient certain preventive protocols, quality metrics, and best practice recommendations. A written personalized care plan for preventive services as well as general preventive health recommendations were provided to patient.     Varney Biles, LPN  7/40/8144

## 2017-09-01 NOTE — Progress Notes (Signed)
I have reviewed the above note and agree.  Anneta Rounds, M.D.  

## 2017-09-16 ENCOUNTER — Other Ambulatory Visit: Payer: Self-pay | Admitting: Family Medicine

## 2017-09-29 ENCOUNTER — Other Ambulatory Visit: Payer: Self-pay

## 2017-09-29 DIAGNOSIS — Z1231 Encounter for screening mammogram for malignant neoplasm of breast: Secondary | ICD-10-CM

## 2017-10-13 IMAGING — MG MM DIGITAL SCREENING BILAT W/ TOMO W/ CAD
8 of 13 series · 8 of 29 positions shown · non-contrast
Comparison: Previous exam(s).

CLINICAL DATA: Screening. History of right breast cancer and
lumpectomy in 5005.

EXAM:
2D DIGITAL SCREENING BILATERAL MAMMOGRAM WITH CAD AND ADJUNCT TOMO

[R MLO (1 of 2)]
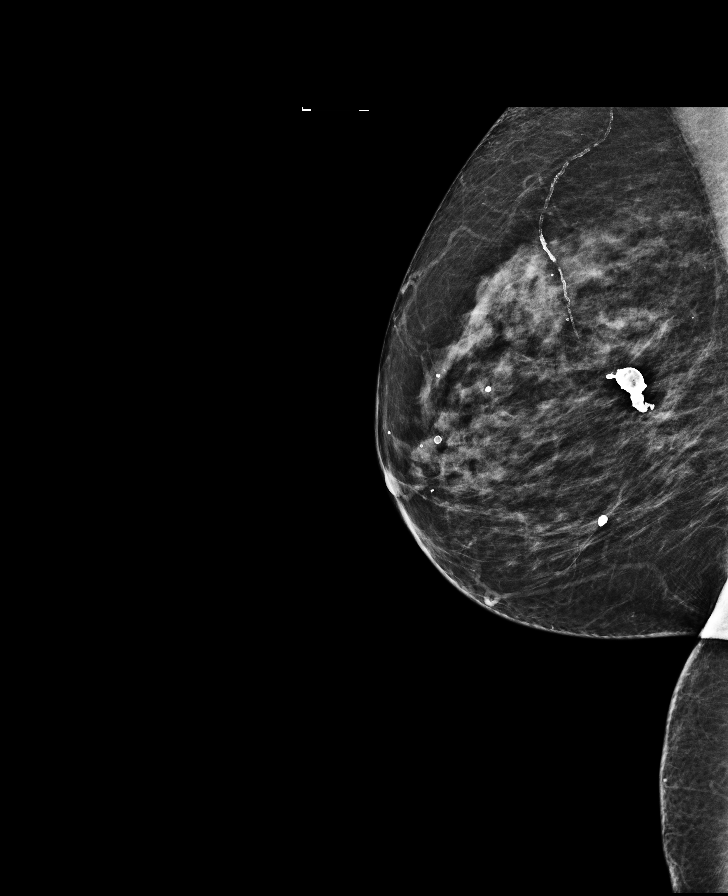

[L CC]
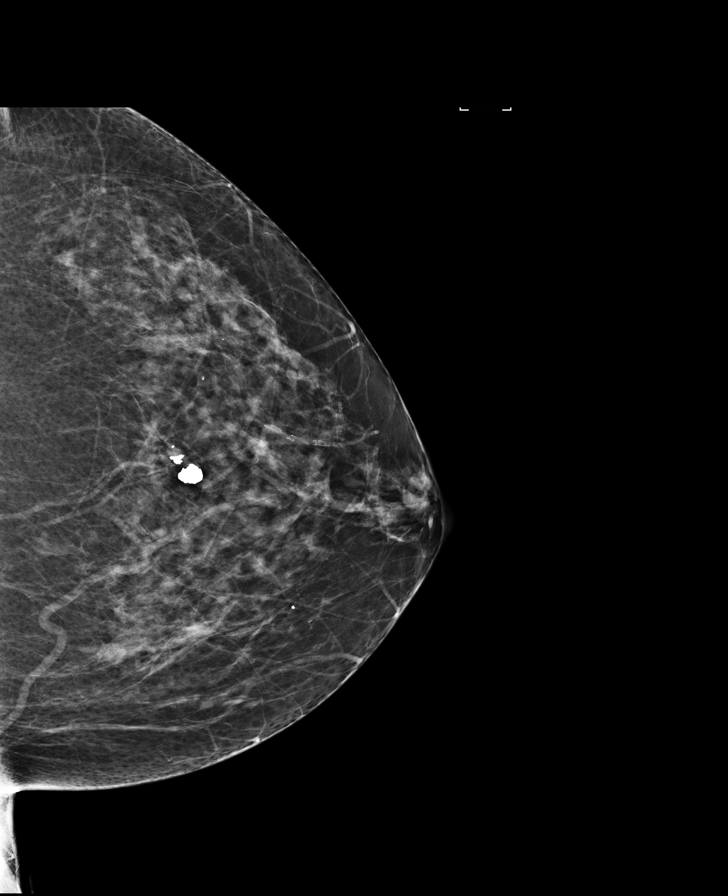

[R CC synth-2D]
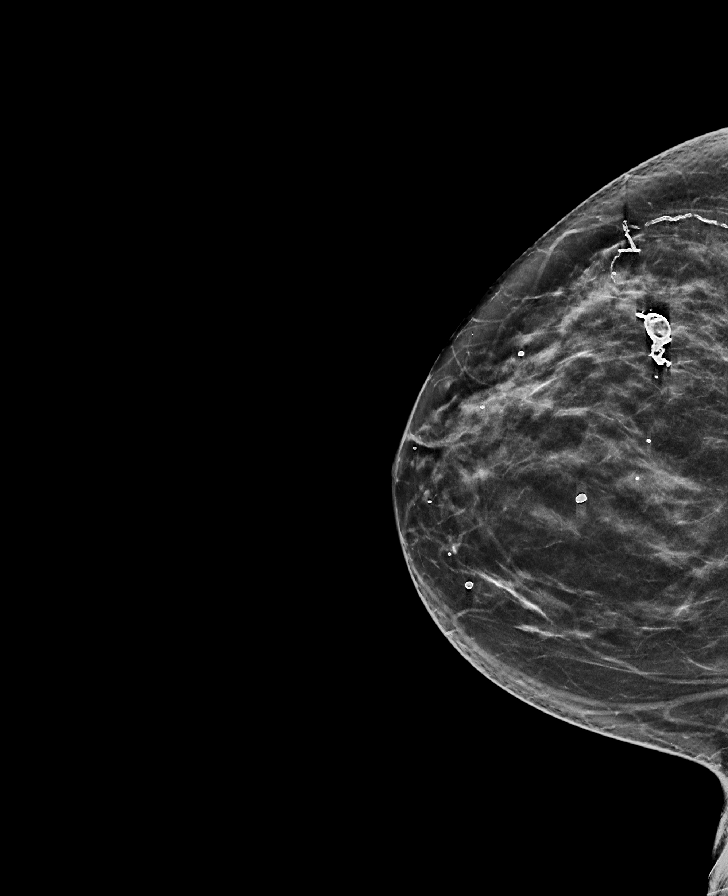

[L MLO synth-2D]
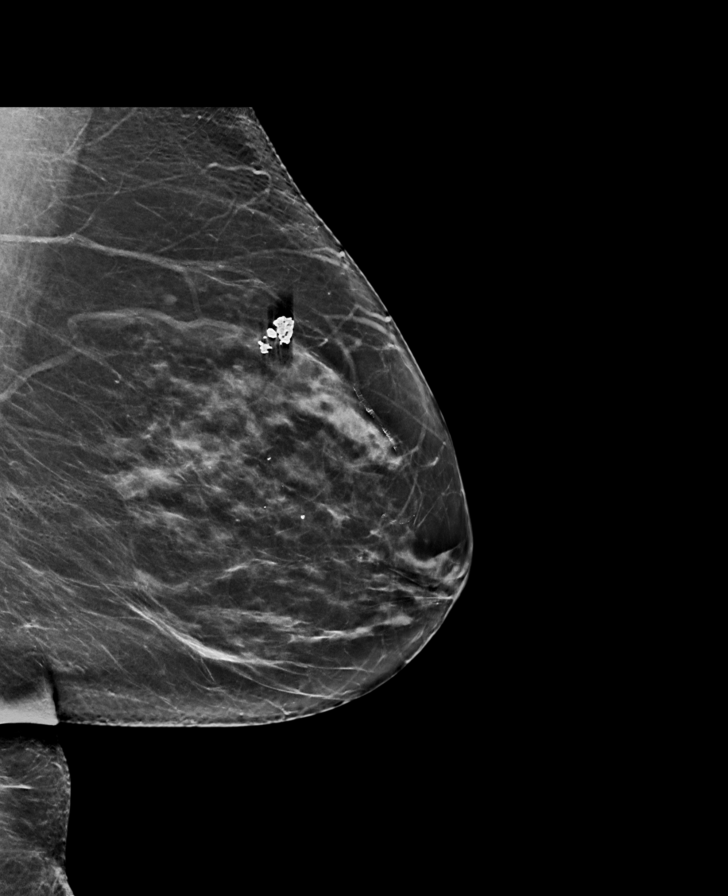

[R MLO synth-2D]
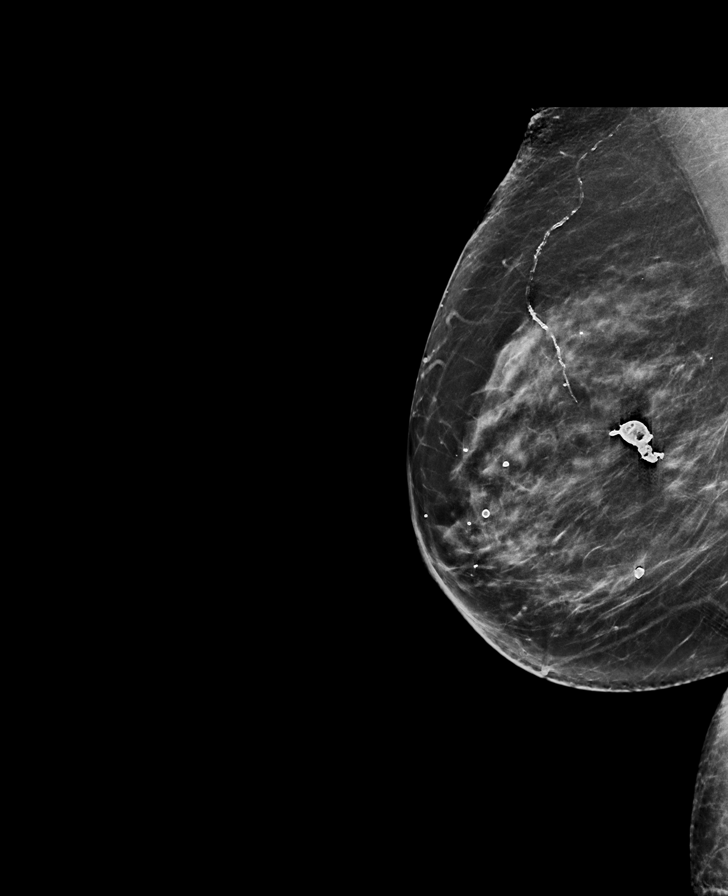

[L CC synth-2D]
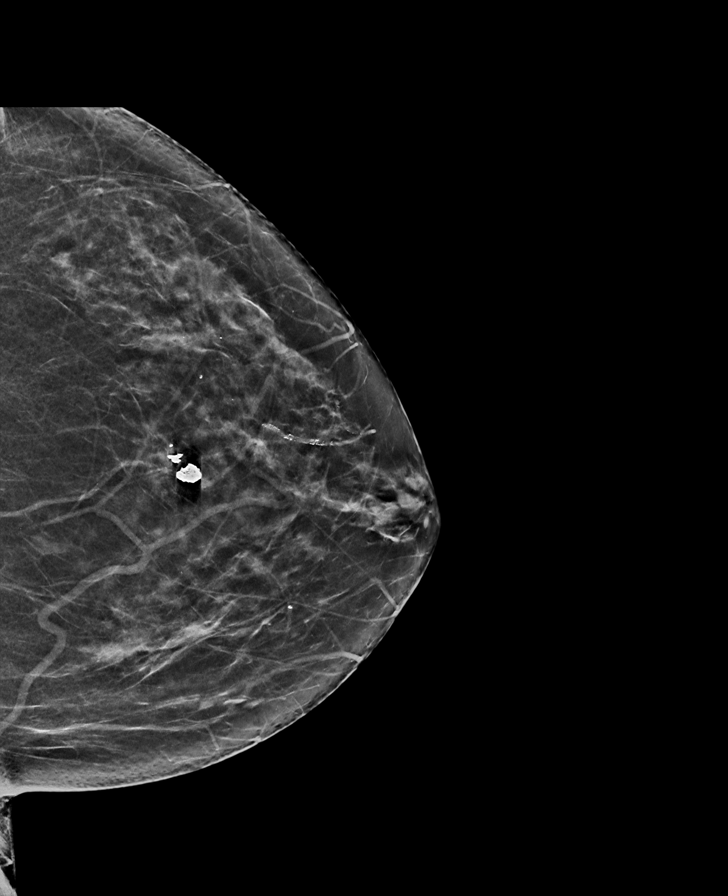

[L MLO]
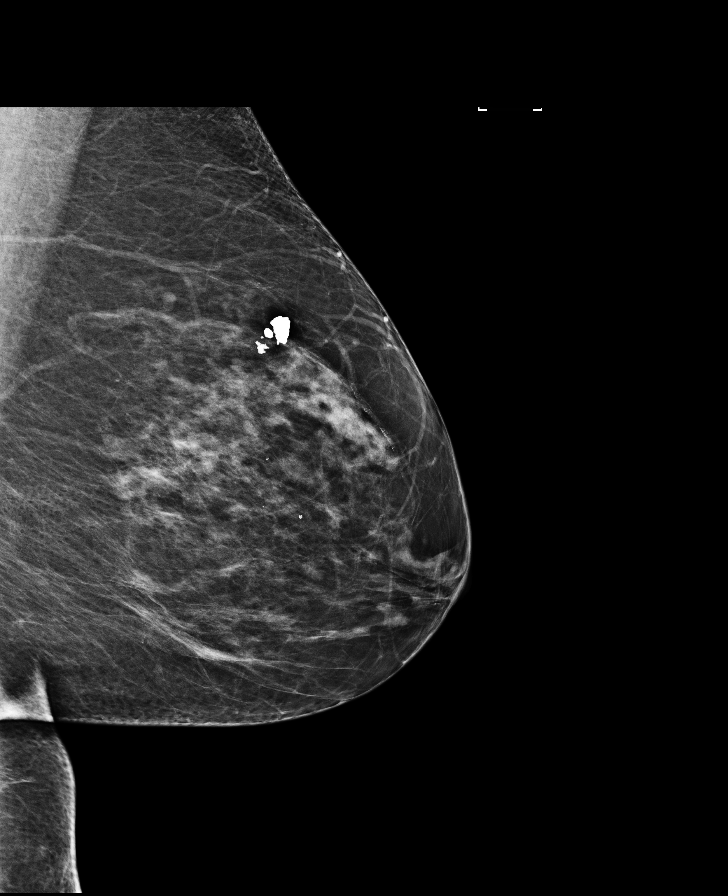

[R MLO (2 of 2)]
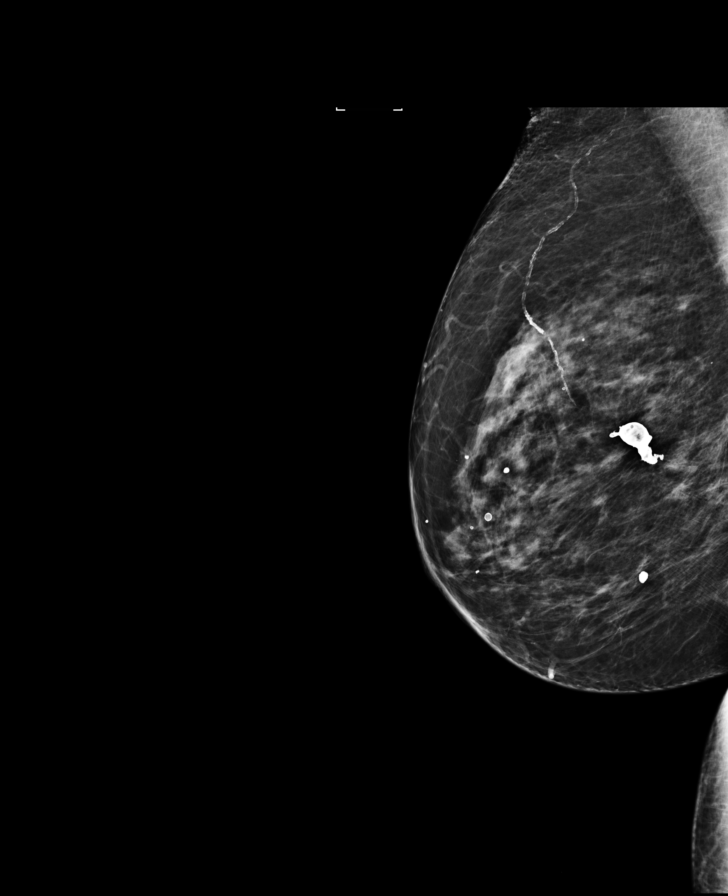

[8 of 29 positions shown; findings below may reference images not displayed]

ACR Breast Density Category b: There are scattered areas of
fibroglandular density.
FINDINGS: There are no findings suspicious for malignancy.

Right breast scarring is again identified.

Images were processed with CAD.
IMPRESSION: No mammographic evidence of malignancy. A result letter of this
screening mammogram will be mailed directly to the patient.

RECOMMENDATION:
Screening mammogram in one year. (Code:[place]-8-57S)

BI-RADS CATEGORY  2: Benign.

## 2017-10-17 DIAGNOSIS — H353131 Nonexudative age-related macular degeneration, bilateral, early dry stage: Secondary | ICD-10-CM | POA: Diagnosis not present

## 2017-11-03 ENCOUNTER — Ambulatory Visit
Admission: RE | Admit: 2017-11-03 | Discharge: 2017-11-03 | Disposition: A | Discharge status: Home / Self Care | Payer: Medicare Other | Source: Ambulatory Visit | Attending: General Surgery | Admitting: General Surgery

## 2017-11-03 DIAGNOSIS — Z1231 Encounter for screening mammogram for malignant neoplasm of breast: Secondary | ICD-10-CM | POA: Diagnosis not present

## 2017-11-20 ENCOUNTER — Ambulatory Visit (INDEPENDENT_AMBULATORY_CARE_PROVIDER_SITE_OTHER): Payer: Medicare Other | Admitting: General Surgery

## 2017-11-20 ENCOUNTER — Other Ambulatory Visit: Payer: Self-pay

## 2017-11-20 ENCOUNTER — Encounter: Payer: Self-pay | Admitting: General Surgery

## 2017-11-20 VITALS — BP 148/82 | HR 89 | Temp 97.9°F | Resp 14 | Ht 60.0 in | Wt 150.6 lb

## 2017-11-20 DIAGNOSIS — Z1231 Encounter for screening mammogram for malignant neoplasm of breast: Secondary | ICD-10-CM

## 2017-11-20 NOTE — Patient Instructions (Addendum)
Patient has been informed to return to the office in 1 year. Patient is due for a colonoscopy. She has been advised to call the office with any questions or concerns.

## 2017-11-20 NOTE — Progress Notes (Signed)
Patient ID: Jenny Roberts, female   DOB: 01-Mar-1945, 72 y.o.   MRN: [id number]  Chief Complaint  Patient presents with  . Follow-up    one year f/u recall bil screening mammo [facility] 11/03/17    HPI Jenny Roberts is a 72 y.o. female here today to follow up for 1 year bilateral mammogram. Patient states she is feeling well. HPI  Past Medical History:  Diagnosis Date  . Anxiety   . Breast cancer ([place]) 2001   Right. Treated with lumpectomy followed by Chemotherapy and Radiation therapy  . Depression   . Hyperlipidemia   . Nontraumatic rupture of tendon of thumb 06/15/2014  . Skin cancer 09/2004   treated at [facility]  . Thyroid disease     Past Surgical History:  Procedure Laterality Date  . ABDOMINAL HYSTERECTOMY  1983   Left parts of each ovary   . BREAST BIOPSY Right 2001   positive  . BREAST EXCISIONAL BIOPSY Right 1990's   benign  . 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  Focal positive posterior margin.  0/5 lymph nodes.  . CARPAL TUNNEL RELEASE  2016  . COLONOSCOPY  2008  . KNEE SURGERY  2003  . mole excision  2006   Basal cell   . WRIST SURGERY Right 2010    Family History  Problem Relation Age of Onset  . Brain cancer Father   . Cancer Maternal Grandfather 3       Colon Cancer    Social History Social History   Tobacco Use  . Smoking status: Never Smoker  . Smokeless tobacco: Never Used  Substance Use Topics  . Alcohol use: No    Alcohol/week: 0.0 standard drinks  . Drug use: No    Allergies  Allergen Reactions  . Amoxicillin-Pot Clavulanate Diarrhea and Other (See Comments)    Has patient had a PCN reaction causing immediate rash, facial/tongue/throat swelling, SOB or lightheadedness with hypotension: No Has patient had a PCN reaction causing severe rash involving mucus membranes or skin necrosis: No Has patient had a PCN reaction that required hospitalization  No Has patient had a PCN reaction occurring within the last 10 years: Yes If all of the above answers are "NO", then may proceed with Cephalosporin use.   . Codeine Hives and Other (See Comments)    shakes  . Latex Rash and Itching    Current Outpatient Medications  Medication Sig Dispense Refill  . acetaminophen (TYLENOL) 500 MG tablet Take by mouth.    . cholecalciferol (VITAMIN D) 1000 units tablet Take 1,000 Units by mouth daily.    . clonazePAM (KLONOPIN) 0.5 MG tablet Take 1 tablet (0.5 mg total) by mouth daily. 90 tablet 1  . famotidine (PEPCID) 20 MG tablet Take 20 mg by mouth 3 times/day as needed-between meals & bedtime for heartburn or indigestion.    . fluticasone (FLONASE) 50 MCG/ACT nasal spray Place 2 sprays into both nostrils daily. (Patient taking differently: Place 2 sprays into both nostrils daily as needed for allergies. ) 16 g 6  . ibuprofen (ADVIL,MOTRIN) 200 MG tablet Take by mouth.    . levothyroxine (SYNTHROID, LEVOTHROID) 100 MCG tablet TAKE ONE TABLET BY MOUTH ONCE DAILY 90 tablet 3  . lovastatin (MEVACOR) 40 MG tablet TAKE ONE TABLET BY MOUTH ONCE DAILY 90 tablet 3  . sertraline (ZOLOFT) 100 MG tablet TAKE 1 TABLET BY MOUTH EVERY DAY 90 tablet  0   No current facility-administered medications for this visit.     Review of Systems Review of Systems  Constitutional: Negative.   Respiratory: Negative.   Cardiovascular: Negative.     Blood pressure (!) 148/82, pulse 89, temperature 97.9 F (36.6 C), temperature source Temporal, resp. rate 14, height 5' (1.524 m), weight 150 lb 9.6 oz (68.3 kg), SpO2 96 %.  Physical Exam Physical Exam  Constitutional: She is oriented to person, place, and time. She appears well-developed and well-nourished.  Eyes: Conjunctivae are normal. No scleral icterus.  Neck: Normal range of motion.  Cardiovascular: Normal rate, regular rhythm and normal heart sounds.  Pulmonary/Chest: Effort normal and breath sounds normal. Right  breast exhibits no inverted nipple, no mass, no nipple discharge, no skin change and no tenderness. Left breast exhibits no inverted nipple, no mass, no nipple discharge, no skin change and no tenderness. Breasts are symmetrical.    Lymphadenopathy:    She has no cervical adenopathy.  Neurological: She is alert and oriented to person, place, and time.  Skin: Skin is warm and dry.    Data Reviewed Bilateral screening mammograms dated November 03, 2017 were reviewed.  Postsurgical changes.  Dense breast.  BI-RADS-1.  Assessment    No evidence of recurrent cancer.  Candidate for colon cancer screening.    Plan Patient has been informed to return to the office in 1 year. Patient is due for a colonoscopy. Reviewed options for colon screening including colonoscopy and Cologuard.  Originally scheduled for colonoscopy last year with Dr. Jamal Collin but other family events got in the way.  No family history or personal history of colon cancer.  No personal history of colon polyps.  Order form and information faxed to Chief Strategy Officer for [employer].  HPI, Physical Exam, Assessment and Plan have been scribed under the direction and in the presence of Hervey Ard, Md.  Eudelia Bunch R. Bobette Mo, CMA  I have completed the exam and reviewed the above documentation for accuracy and completeness.  I agree with the above.  Haematologist has been used and any errors in dictation or transcription are unintentional.  Hervey Ard, M.D., F.A.C.S.  Forest Gleason Kwasi Joung 11/20/2017, 9:49 PM

## 2017-11-22 ENCOUNTER — Other Ambulatory Visit: Payer: Self-pay | Admitting: Family Medicine

## 2017-12-06 ENCOUNTER — Other Ambulatory Visit: Payer: Self-pay | Admitting: Family Medicine

## 2017-12-09 NOTE — Telephone Encounter (Signed)
Patient has not been seen in greater than 6 months.  I will send in a 1 month refill though she needs to schedule an appointment in the next 1 to 2 months to receive further refills.  She could be scheduled on a Monday, Wednesday, or Friday at 430 or any other available 30-minute time slot.  Thanks.

## 2017-12-10 DIAGNOSIS — Z1212 Encounter for screening for malignant neoplasm of rectum: Secondary | ICD-10-CM | POA: Diagnosis not present

## 2017-12-10 DIAGNOSIS — Z1211 Encounter for screening for malignant neoplasm of colon: Secondary | ICD-10-CM | POA: Diagnosis not present

## 2017-12-11 ENCOUNTER — Telehealth: Payer: Self-pay

## 2017-12-11 NOTE — Telephone Encounter (Signed)
Called patient and was unable to leave a VM to call back. Will try and call again later.

## 2017-12-11 NOTE — Telephone Encounter (Signed)
Refill request from [facility]   Refill for clonazepam   Last OV  02/28/2017   Last refilled 12/09/2017 disp 30 with no refills   Sent to PCP for approval

## 2017-12-11 NOTE — Telephone Encounter (Signed)
Called and spoke with patient. Pt advised that she is scheduled for an appt on 12/26/2017 @ 4:30 PM.   Please schedule an appt for this appt once done send a message back to me thanks.   Sent to [employer]

## 2017-12-11 NOTE — Telephone Encounter (Signed)
This was sent to her pharmacy 2 days ago. She needs a follow-up appointment in the next 1-2 months for further refills to be given. Please call her and get her scheduled. It is ok to use a 4:30 time slot or any open 30 minute time slot. Thanks.

## 2017-12-11 NOTE — Telephone Encounter (Signed)
Patient scheduled.

## 2017-12-18 LAB — COLOGUARD

## 2017-12-19 ENCOUNTER — Other Ambulatory Visit: Payer: Self-pay | Admitting: Family Medicine

## 2017-12-22 ENCOUNTER — Encounter: Payer: Self-pay | Admitting: General Surgery

## 2017-12-22 NOTE — Telephone Encounter (Signed)
Last OV 03/01/2017   last refilled 09/17/2017 disp 90 with no refills   Sent to PCP for approval

## 2017-12-23 ENCOUNTER — Other Ambulatory Visit: Payer: Self-pay | Admitting: General Surgery

## 2017-12-23 ENCOUNTER — Telehealth: Payer: Self-pay

## 2017-12-23 DIAGNOSIS — R195 Other fecal abnormalities: Secondary | ICD-10-CM

## 2017-12-23 NOTE — Telephone Encounter (Signed)
-----   Message from Robert Bellow, MD sent at 12/22/2017  4:28 PM EST ----- Please notify the patient that her Cologuard came back positive. This does not mean she has cancer, but does mean she should have a colonoscopy.

## 2017-12-23 NOTE — Telephone Encounter (Signed)
Notified patient as instructed, patient would like to go ahead and scheduled a colonoscopy. She has selected to have this done on 02/11/18. We will call her with her full instructions once we receive the order sheet from Dr Bary Castilla.

## 2017-12-24 ENCOUNTER — Telehealth: Payer: Self-pay

## 2017-12-24 MED ORDER — POLYETHYLENE GLYCOL 3350 17 GM/SCOOP PO POWD: ORAL | 0 refills | Status: DC

## 2017-12-24 NOTE — Telephone Encounter (Signed)
Spoke with the patient and reviewed Colonoscopy instructions. I have mailed a copy of this to the patient.  The patient is scheduled for a Colonoscopy at [facility] on 02/11/18. They are aware to call the day before to get their arrival time. Miralax prescription has been sent into the patient's pharmacy. The patient is aware of date and instructions.

## 2017-12-26 ENCOUNTER — Ambulatory Visit (INDEPENDENT_AMBULATORY_CARE_PROVIDER_SITE_OTHER): Payer: Medicare Other | Admitting: Family Medicine

## 2017-12-26 ENCOUNTER — Encounter: Payer: Self-pay | Admitting: Family Medicine

## 2017-12-26 VITALS — BP 128/72 | HR 76 | Temp 97.6°F | Resp 16 | Ht 60.0 in | Wt 148.8 lb

## 2017-12-26 DIAGNOSIS — S76011A Strain of muscle, fascia and tendon of right hip, initial encounter: Secondary | ICD-10-CM | POA: Insufficient documentation

## 2017-12-26 DIAGNOSIS — Z23 Encounter for immunization: Secondary | ICD-10-CM | POA: Diagnosis not present

## 2017-12-26 DIAGNOSIS — E785 Hyperlipidemia, unspecified: Secondary | ICD-10-CM | POA: Diagnosis not present

## 2017-12-26 DIAGNOSIS — E039 Hypothyroidism, unspecified: Secondary | ICD-10-CM

## 2017-12-26 DIAGNOSIS — F411 Generalized anxiety disorder: Secondary | ICD-10-CM

## 2017-12-26 MED ORDER — CLONAZEPAM 0.5 MG PO TABS: 0.5000 mg | ORAL_TABLET | Freq: Every day | ORAL | 0 refills | Status: DC

## 2017-12-26 MED ORDER — SERTRALINE HCL 100 MG PO TABS: 100.0000 mg | ORAL_TABLET | Freq: Every day | ORAL | 1 refills | Status: DC

## 2017-12-26 MED ORDER — LEVOTHYROXINE SODIUM 100 MCG PO TABS: 100.0000 ug | ORAL_TABLET | Freq: Every day | ORAL | 3 refills | Status: DC

## 2017-12-26 NOTE — Progress Notes (Signed)
[name], MD Phone: [phone]  Jenny Roberts is a 72 y.o. female who presents today for follow-up.  CC: Hypothyroidism, anxiety, right buttock pain, hyperlipidemia  Hypothyroidism: Taking Synthroid.  No skin changes or heat or cold intolerance.  Anxiety: Patient denies any anxiety symptoms.  No depression.  She takes Zoloft.  She takes clonazepam nightly.  No drowsiness with this.  No alcohol use with this.  Hyperlipidemia: Taking lovastatin.  No chest pain, right upper quadrant pain, or myalgias.  Right buttock pain: Patient notes this has been going on for some time now.  Notes that it potentially started after picking up children that weigh about 20 pounds.  She does not note any specific injury.  Notes the discomfort is in the middle of her buttocks and bothers her when she sits or lays down.  Occasionally radiates down the back of her leg.  No back pain.  No numbness or weakness.  No saddle anesthesia.  No bowel or bladder incontinence.  Heating pad and ibuprofen alternating with Tylenol is somewhat beneficial.  Social History   Tobacco Use  Smoking Status Never Smoker  Smokeless Tobacco Never Used     ROS see history of present illness  Objective  Physical Exam Vitals:   12/26/17 1623  BP: 128/72  Pulse: 76  Resp: 16  Temp: 97.6 F (36.4 C)  SpO2: 96%    BP Readings from Last 3 Encounters:  12/26/17 128/72  11/20/17 (!) 148/82  08/18/17 118/72   Wt Readings from Last 3 Encounters:  12/26/17 148 lb 12.8 oz (67.5 kg)  11/20/17 150 lb 9.6 oz (68.3 kg)  08/18/17 151 lb 12.8 oz (68.9 kg)    Physical Exam  Constitutional: No distress.  Cardiovascular: Normal rate, regular rhythm and normal heart sounds.  Pulmonary/Chest: Effort normal and breath sounds normal.  Musculoskeletal: She exhibits no edema.  Chaperone used, small bruise noted in the area of discomfort in her right buttock, slight tenderness in that area, no palpable defects  Neurological:  She is alert.  Skin: Skin is warm and dry. She is not diaphoretic.     Assessment/Plan: Please see individual problem list.  Hypothyroidism Asymptomatic.  Continue Synthroid.  Plan to recheck TSH in 3 months.  Anxiety state Adequately controlled.  Continue current regimen.  Refill sent to pharmacy.  Controlled substance database reviewed.  Hyperlipidemia Continue lovastatin.  Recheck labs in 3 months.  Strain of buttock, right, initial encounter Suspect muscular strain though could represent sciatica versus piriformis syndrome.  We will give exercises to complete.  If they are painful she will not do them.  She will let us know if not improving over the next 1 to 2 weeks.  Orders Placed This Encounter  Procedures  . Flu vaccine HIGH DOSE PF (Fluzone High dose)    Meds ordered this encounter  Medications  . levothyroxine (SYNTHROID, LEVOTHROID) 100 MCG tablet    Sig: Take 1 tablet (100 mcg total) by mouth daily.    Dispense:  90 tablet    Refill:  3  . sertraline (ZOLOFT) 100 MG tablet    Sig: Take 1 tablet (100 mg total) by mouth daily.    Dispense:  90 tablet    Refill:  1  . clonazePAM (KLONOPIN) 0.5 MG tablet    Sig: Take 1 tablet (0.5 mg total) by mouth daily.    Dispense:  30 tablet    Refill:  0     [name], MD [address] [facility] -  [employer]

## 2017-12-26 NOTE — Assessment & Plan Note (Signed)
Asymptomatic.  Continue Synthroid.  Plan to recheck TSH in 3 months.

## 2017-12-26 NOTE — Assessment & Plan Note (Signed)
Continue lovastatin.  Recheck labs in 3 months.

## 2017-12-26 NOTE — Assessment & Plan Note (Signed)
Adequately controlled.  Continue current regimen.  Refill sent to pharmacy.  Controlled substance database reviewed.

## 2017-12-26 NOTE — Assessment & Plan Note (Signed)
Suspect muscular strain though could represent sciatica versus piriformis syndrome.  We will give exercises to complete.  If they are painful she will not do them.  She will let us know if not improving over the next 1 to 2 weeks.

## 2017-12-26 NOTE — Patient Instructions (Addendum)
Nice to see you. I have refilled your medications. We will see you back in 3 months for lab work and an office visit.   Back Exercises If you have pain in your back, do these exercises 2-3 times each day or as told by your doctor. When the pain goes away, do the exercises once each day, but repeat the steps more times for each exercise (do more repetitions). If you do not have pain in your back, do these exercises once each day or as told by your doctor. Exercises Single Knee to Chest  Do these steps 3-5 times in a row for each leg: 1. Lie on your back on a firm bed or the floor with your legs stretched out. 2. Bring one knee to your chest. 3. Hold your knee to your chest by grabbing your knee or thigh. 4. Pull on your knee until you feel a gentle stretch in your lower back. 5. Keep doing the stretch for 10-30 seconds. 6. Slowly let go of your leg and straighten it.  Pelvic Tilt  Do these steps 5-10 times in a row: 1. Lie on your back on a firm bed or the floor with your legs stretched out. 2. Bend your knees so they point up to the ceiling. Your feet should be flat on the floor. 3. Tighten your lower belly (abdomen) muscles to press your lower back against the floor. This will make your tailbone point up to the ceiling instead of pointing down to your feet or the floor. 4. Stay in this position for 5-10 seconds while you gently tighten your muscles and breathe evenly.  Cat-Cow  Do these steps until your lower back bends more easily: 1. Get on your hands and knees on a firm surface. Keep your hands under your shoulders, and keep your knees under your hips. You may put padding under your knees. 2. Let your head hang down, and make your tailbone point down to the floor so your lower back is round like the back of a cat. 3. Stay in this position for 5 seconds. 4. Slowly lift your head and make your tailbone point up to the ceiling so your back hangs low (sags) like the back of a  cow. 5. Stay in this position for 5 seconds.  Press-Ups  Do these steps 5-10 times in a row: 1. Lie on your belly (face-down) on the floor. 2. Place your hands near your head, about shoulder-width apart. 3. While you keep your back relaxed and keep your hips on the floor, slowly straighten your arms to raise the top half of your body and lift your shoulders. Do not use your back muscles. To make yourself more comfortable, you may change where you place your hands. 4. Stay in this position for 5 seconds. 5. Slowly return to lying flat on the floor.  Bridges  Do these steps 10 times in a row: 1. Lie on your back on a firm surface. 2. Bend your knees so they point up to the ceiling. Your feet should be flat on the floor. 3. Tighten your butt muscles and lift your butt off of the floor until your waist is almost as high as your knees. If you do not feel the muscles working in your butt and the back of your thighs, slide your feet 1-2 inches farther away from your butt. 4. Stay in this position for 3-5 seconds. 5. Slowly lower your butt to the floor, and let your butt muscles relax.  If this exercise is too easy, try doing it with your arms crossed over your chest. Belly Crunches  Do these steps 5-10 times in a row: 1. Lie on your back on a firm bed or the floor with your legs stretched out. 2. Bend your knees so they point up to the ceiling. Your feet should be flat on the floor. 3. Cross your arms over your chest. 4. Tip your chin a little bit toward your chest but do not bend your neck. 5. Tighten your belly muscles and slowly raise your chest just enough to lift your shoulder blades a tiny bit off of the floor. 6. Slowly lower your chest and your head to the floor.  Back Lifts Do these steps 5-10 times in a row: 1. Lie on your belly (face-down) with your arms at your sides, and rest your forehead on the floor. 2. Tighten the muscles in your legs and your butt. 3. Slowly lift your  chest off of the floor while you keep your hips on the floor. Keep the back of your head in line with the curve in your back. Look at the floor while you do this. 4. Stay in this position for 3-5 seconds. 5. Slowly lower your chest and your face to the floor.  Contact a doctor if:  Your back pain gets a lot worse when you do an exercise.  Your back pain does not lessen 2 hours after you exercise. If you have any of these problems, stop doing the exercises. Do not do them again unless your doctor says it is okay. Get help right away if:  You have sudden, very bad back pain. If this happens, stop doing the exercises. Do not do them again unless your doctor says it is okay. This information is not intended to replace advice given to you by your health care provider. Make sure you discuss any questions you have with your health care provider. Document Released: 02/09/2010 Document Revised: 06/15/2015 Document Reviewed: 03/03/2014 Elsevier Interactive Patient Education  [employer].

## 2018-01-04 ENCOUNTER — Other Ambulatory Visit: Payer: Self-pay | Admitting: Family Medicine

## 2018-01-05 NOTE — Telephone Encounter (Signed)
Last OV 12/26/2017   Last refilled 01/08/2018 disp 30 with no refills. Sent to PCP confused since it is 01/05/2018 not sure why this is dated 01/08/2018.

## 2018-01-06 NOTE — Telephone Encounter (Signed)
Pt advised and voiced understanding.   

## 2018-01-06 NOTE — Telephone Encounter (Signed)
Please contact the patient let her know this was sent to her pharmacy earlier this month.  She should have a refill available to pick up on 01/08/2018.  She should check with her pharmacy.  Thanks.

## 2018-01-29 ENCOUNTER — Other Ambulatory Visit: Payer: Self-pay | Admitting: Family Medicine

## 2018-02-02 ENCOUNTER — Other Ambulatory Visit: Payer: Self-pay | Admitting: *Deleted

## 2018-02-02 ENCOUNTER — Telehealth: Payer: Self-pay | Admitting: *Deleted

## 2018-02-02 ENCOUNTER — Telehealth: Payer: Self-pay

## 2018-02-02 DIAGNOSIS — M25551 Pain in right hip: Secondary | ICD-10-CM

## 2018-02-02 DIAGNOSIS — R195 Other fecal abnormalities: Secondary | ICD-10-CM

## 2018-02-02 NOTE — Telephone Encounter (Signed)
Sent to PCP for approval   Last OV 12/26/2017   Last refilled  01/08/2018 disp 30 with no refills

## 2018-02-02 NOTE — Telephone Encounter (Signed)
Sent to PCP to place referral to see orthopedic

## 2018-02-02 NOTE — Telephone Encounter (Signed)
Copied from [place] [id number]. Topic: Referral - Request for Referral >> Feb 02, 2018  8:23 AM Nils Flack wrote: Has patient seen PCP for this complaint? Yes.   *If NO, is insurance requiring patient see PCP for this issue before PCP can refer them? Referral for which specialty: orthopedics Preferred provider/office: [facility] Reason for referral: hip pain radiating down leg, also right shoulder and right arm  Phone is [phone]

## 2018-02-02 NOTE — Progress Notes (Signed)
Referral placed to [facility].   Patient needs to have a colonoscopy due to positive cologuard testing.   Originally, patient was scheduled to have this done on 02-11-18 but she will be unable to have done due to Dr. Bary Castilla being on leave. Not sure at present his return date.   Patient requested a referral.   Colonoscopy for 02-11-18 was cancelled with [facility] today.  Patient aware she will be contacted by [facility] regarding an appointment.

## 2018-02-02 NOTE — Telephone Encounter (Signed)
Patient to be ref to GI

## 2018-02-03 ENCOUNTER — Other Ambulatory Visit: Payer: Self-pay

## 2018-02-03 NOTE — Telephone Encounter (Signed)
Referral placed.

## 2018-02-03 NOTE — Telephone Encounter (Signed)
Fax from [employer]  Refill request for Clonazepam   Last OV 12/26/2017   Last refilled 01/08/2018 disp 30 with no refills    Sent to PCP to advise

## 2018-02-03 NOTE — Addendum Note (Signed)
Addended by: Caryl Bis Densil Ottey G on: 02/03/2018 12:46 PM   Modules accepted: Orders

## 2018-02-04 DIAGNOSIS — M25511 Pain in right shoulder: Secondary | ICD-10-CM | POA: Diagnosis not present

## 2018-02-04 NOTE — Telephone Encounter (Signed)
Controlled substance database reviewed. Sent to pharmacy.   

## 2018-02-09 DIAGNOSIS — M75101 Unspecified rotator cuff tear or rupture of right shoulder, not specified as traumatic: Secondary | ICD-10-CM | POA: Diagnosis not present

## 2018-02-10 ENCOUNTER — Telehealth: Payer: Self-pay | Admitting: Gastroenterology

## 2018-02-10 NOTE — Telephone Encounter (Signed)
Patient has not received her colonoscopy instructions for her procedure schedule with Dr. Vicente Males on 02/16/18.  She has provided me with two emails to send her instructions to.  I will email instructions to her. I advised that she may use the bowel prep that she currently has for her colonoscopy.  Thanks [employer]

## 2018-02-10 NOTE — Telephone Encounter (Signed)
PT IS CALLING SHE STATES SHE HAS A PROCEDURE 02/16/18 AND HAS NOT RECEIVED HER PREP OR INSTRUCTIONS PLEASE CALL PT

## 2018-02-16 ENCOUNTER — Encounter
Admission: RE | Disposition: A | Discharge status: Home / Self Care | Payer: Self-pay | Source: Home / Self Care | Attending: Gastroenterology

## 2018-02-16 ENCOUNTER — Ambulatory Visit
Admission: RE | Admit: 2018-02-16 | Discharge: 2018-02-16 | Disposition: A | Discharge status: Home / Self Care | Payer: Medicare Other | Attending: Gastroenterology | Admitting: Gastroenterology

## 2018-02-16 ENCOUNTER — Ambulatory Visit: Payer: Medicare Other | Admitting: Certified Registered Nurse Anesthetist

## 2018-02-16 ENCOUNTER — Other Ambulatory Visit: Payer: Self-pay

## 2018-02-16 ENCOUNTER — Encounter: Payer: Self-pay | Admitting: Certified Registered Nurse Anesthetist

## 2018-02-16 ENCOUNTER — Telehealth: Payer: Self-pay | Admitting: Gastroenterology

## 2018-02-16 DIAGNOSIS — Z853 Personal history of malignant neoplasm of breast: Secondary | ICD-10-CM | POA: Diagnosis not present

## 2018-02-16 DIAGNOSIS — E039 Hypothyroidism, unspecified: Secondary | ICD-10-CM | POA: Insufficient documentation

## 2018-02-16 DIAGNOSIS — F419 Anxiety disorder, unspecified: Secondary | ICD-10-CM | POA: Insufficient documentation

## 2018-02-16 DIAGNOSIS — E785 Hyperlipidemia, unspecified: Secondary | ICD-10-CM | POA: Insufficient documentation

## 2018-02-16 DIAGNOSIS — R195 Other fecal abnormalities: Secondary | ICD-10-CM

## 2018-02-16 DIAGNOSIS — Z923 Personal history of irradiation: Secondary | ICD-10-CM | POA: Insufficient documentation

## 2018-02-16 DIAGNOSIS — Z9221 Personal history of antineoplastic chemotherapy: Secondary | ICD-10-CM | POA: Insufficient documentation

## 2018-02-16 DIAGNOSIS — F329 Major depressive disorder, single episode, unspecified: Secondary | ICD-10-CM | POA: Diagnosis not present

## 2018-02-16 DIAGNOSIS — Z538 Procedure and treatment not carried out for other reasons: Secondary | ICD-10-CM | POA: Diagnosis not present

## 2018-02-16 DIAGNOSIS — Z79890 Hormone replacement therapy (postmenopausal): Secondary | ICD-10-CM | POA: Diagnosis not present

## 2018-02-16 DIAGNOSIS — Z79899 Other long term (current) drug therapy: Secondary | ICD-10-CM | POA: Insufficient documentation

## 2018-02-16 HISTORY — PX: COLONOSCOPY WITH PROPOFOL: SHX5780

## 2018-02-16 SURGERY — COLONOSCOPY WITH PROPOFOL
Anesthesia: General

## 2018-02-16 MED ORDER — SODIUM CHLORIDE 0.9 % IV SOLN
INTRAVENOUS | Status: DC
  Administered 2018-02-16: 1000 mL via INTRAVENOUS

## 2018-02-16 MED ORDER — LIDOCAINE HCL (PF) 2 % IJ SOLN
INTRAMUSCULAR | Status: AC
  Filled 2018-02-16: qty 10

## 2018-02-16 MED ORDER — PROPOFOL 500 MG/50ML IV EMUL
INTRAVENOUS | Status: DC | PRN
  Administered 2018-02-16: 125 ug/kg/min via INTRAVENOUS

## 2018-02-16 MED ORDER — PROPOFOL 10 MG/ML IV BOLUS
INTRAVENOUS | Status: DC | PRN
  Administered 2018-02-16: 70 mg via INTRAVENOUS

## 2018-02-16 MED ORDER — LIDOCAINE HCL (CARDIAC) PF 100 MG/5ML IV SOSY
PREFILLED_SYRINGE | INTRAVENOUS | Status: DC | PRN
  Administered 2018-02-16: 50 mg via INTRAVENOUS

## 2018-02-16 MED ORDER — PROPOFOL 500 MG/50ML IV EMUL
INTRAVENOUS | Status: AC
  Filled 2018-02-16: qty 50

## 2018-02-16 NOTE — Transfer of Care (Signed)
Immediate Anesthesia Transfer of Care Note  Patient: Jenny Roberts  Procedure(s) Performed: COLONOSCOPY WITH PROPOFOL (N/A )  Patient Location: PACU and Endoscopy Unit  Anesthesia Type:General  Level of Consciousness: drowsy  Airway & Oxygen Therapy: Patient Spontanous Breathing  Post-op Assessment: Report given to RN and Post -op Vital signs reviewed and stable  Post vital signs: Reviewed and stable  Last Vitals:  Vitals Value Taken Time  BP 128/62 02/16/2018  9:55 AM  Temp    Pulse 69 02/16/2018  9:55 AM  Resp 17 02/16/2018  9:55 AM  SpO2 96 % 02/16/2018  9:55 AM  Vitals shown include unvalidated device data.  Last Pain:  Vitals:   02/16/18 0843  TempSrc: Tympanic         Complications: No apparent anesthesia complications

## 2018-02-16 NOTE — Telephone Encounter (Signed)
Patient called & states she was to be rescheduled for next Monday to have a repeat colonoscopy because she wasn't clean enough today. She has the prep just want a call with the date & time the next one is scheduled for.

## 2018-02-16 NOTE — Op Note (Signed)
[facility] Gastroenterology Patient Name: Jenny Roberts Procedure Date: 02/16/2018 9:39 AM MRN: [id number] Account #: [id number] Date of Birth: 03-Jan-1946 Admit Type: Outpatient Age: 73 Room: [facility] ENDO ROOM 4 Gender: Female Note Status: Finalized Procedure:            Colonoscopy Indications:          Positive Cologuard test Providers:            Jonathon Bellows MD, MD Referring MD:         Angela Adam. Caryl Bis (Referring MD) Medicines:            Monitored Anesthesia Care Complications:        No immediate complications. Procedure:            Pre-Anesthesia Assessment:                       - Prior to the procedure, a History and Physical was                        performed, and patient medications, allergies and                        sensitivities were reviewed. The patient's tolerance of                        previous anesthesia was reviewed.                       - The risks and benefits of the procedure and the                        sedation options and risks were discussed with the                        patient. All questions were answered and informed                        consent was obtained.                       - ASA Grade Assessment: III - A patient with severe                        systemic disease.                       After obtaining informed consent, the colonoscope was                        passed under direct vision. Throughout the procedure,                        the patient's blood pressure, pulse, and oxygen                        saturations were monitored continuously. The                        Colonoscope was introduced through the anus with the  intention of advancing to the cecum. The scope was                        advanced to the sigmoid colon before the procedure was                        aborted. Medications were given. The colonoscopy was                        performed with ease. The patient tolerated  the                        procedure well. The quality of the bowel preparation                        was poor. Findings:      The perianal and digital rectal examinations were normal.      A large amount of semi-solid stool was found in the rectum and in the       sigmoid colon, interfering with visualization. Impression:           - Preparation of the colon was poor.                       - Stool in the rectum and in the sigmoid colon.                       - No specimens collected. Recommendation:       - Discharge patient to home (with escort).                       - Resume previous diet.                       - Continue present medications.                       - Repeat colonoscopy in 2 weeks because the bowel                        preparation was suboptimal. Procedure Code(s):    --- Professional ---                       [phone], 53, Colonoscopy, flexible; diagnostic, including                        collection of specimen(s) by brushing or washing, when                        performed (separate procedure) Diagnosis Code(s):    --- Professional ---                       R19.5, Other fecal abnormalities CPT copyright 2018 American Medical Association. All rights reserved. The codes documented in this report are preliminary and upon coder review may  be revised to meet current compliance requirements. Jonathon Bellows, MD Jonathon Bellows MD, MD 02/16/2018 9:51:48 AM This report has been signed electronically. Number of Addenda: 0 Note Initiated On: 02/16/2018 9:39 AM Total Procedure Duration: 0 hours 1 minute 5 seconds       [place]  [facility]

## 2018-02-16 NOTE — Progress Notes (Signed)
Spoke with Jenny Roberts at [facility]. They have informed me that per Dr. Vicente Males, pt will need to be scheduled for a repeat colonoscopy on 02-17-18 due to poor prep. Pt was instructed to come by the office to pick up a bowel prep kit.

## 2018-02-16 NOTE — H&P (Addendum)
     Kiran Anna, MD [address], [address], [place], [place], [address] [address], [address], [place], [place], [address] Phone: [phone]  Fax: [phone]  Primary Care Physician:  Sonnenberg, Eric G, MD   Pre-Procedure History & Physical: HPI:  Jenny Roberts is a 72 y.o. female is here for an colonoscopy.   Past Medical History:  Diagnosis Date  . Anxiety   . Breast cancer (HCC) 2001   Right. Treated with lumpectomy followed by Chemotherapy and Radiation therapy  . Depression   . Hyperlipidemia   . Nontraumatic rupture of tendon of thumb 06/15/2014  . Skin cancer 09/2004   treated at [facility]  . Thyroid disease     Past Surgical History:  Procedure Laterality Date  . ABDOMINAL HYSTERECTOMY  1983   Left parts of each ovary   . BREAST BIOPSY Right 2001   positive  . BREAST EXCISIONAL BIOPSY Right 1990's   benign  . 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  Focal positive posterior margin.  0/5 lymph nodes.  . CARPAL TUNNEL RELEASE  2016  . COLONOSCOPY  2008  . KNEE SURGERY  2003  . mole excision  2006   Basal cell   . WRIST SURGERY Right 2010    Prior to Admission medications   Medication Sig Start Date End Date Taking? Authorizing Provider  cholecalciferol (VITAMIN D) 1000 units tablet Take 1,000 Units by mouth daily.   Yes [provider]  clonazePAM (KLONOPIN) 0.5 MG tablet TAKE 1 TABLET(0.5 MG) BY MOUTH DAILY 02/04/18  Yes Sonnenberg, Eric G, MD  famotidine (PEPCID) 20 MG tablet Take 20 mg by mouth 3 times/day as needed-between meals & bedtime for heartburn or indigestion.   Yes [provider]  fluticasone (FLONASE) 50 MCG/ACT nasal spray Place 2 sprays into both nostrils daily. Patient taking differently: Place 2 sprays into both nostrils daily as needed for allergies.  12/06/14  Yes Doss, Carrie M, RN  levothyroxine (SYNTHROID, LEVOTHROID) 100  MCG tablet Take 1 tablet (100 mcg total) by mouth daily. 12/26/17  Yes Sonnenberg, Eric G, MD  lovastatin (MEVACOR) 40 MG tablet TAKE 1 TABLET BY MOUTH DAILY 11/24/17  Yes Sonnenberg, Eric G, MD  polyethylene glycol powder (GLYCOLAX/MIRALAX) powder Mix whole container with 64 ounces of clear liquids, No Red liquids. 12/24/17  Yes Byrnett, Jeffrey W, MD  sertraline (ZOLOFT) 100 MG tablet Take 1 tablet (100 mg total) by mouth daily. 12/26/17  Yes Sonnenberg, Eric G, MD  acetaminophen (TYLENOL) 500 MG tablet Take by mouth.    [provider]  ibuprofen (ADVIL,MOTRIN) 200 MG tablet Take by mouth.    [provider]    Allergies as of 12/25/2017 - Review Complete 11/20/2017  Allergen Reaction Noted  . Amoxicillin-pot clavulanate Diarrhea and Other (See Comments) 10/10/2014  . Codeine Hives and Other (See Comments) 08/18/2012  . Latex Rash and Itching 08/18/2012    Family History  Problem Relation Age of Onset  . Brain cancer Father   . Cancer Maternal Grandfather 70       Colon Cancer    Social History   Socioeconomic History  . Marital status: Married    Spouse name: Not on file  . Number of children: Not on file  . Years of education: Not on file  . Highest education level: Not on file  Occupational History  . Not on file  Social   Needs  . Financial resource strain: Not hard at all  . Food insecurity:    Worry: Never true    Inability: Never true  . Transportation needs:    Medical: No    Non-medical: No  Tobacco Use  . Smoking status: Never Smoker  . Smokeless tobacco: Never Used  Substance and Sexual Activity  . Alcohol use: No    Alcohol/week: 0.0 standard drinks  . Drug use: No  . Sexual activity: Not Currently  Lifestyle  . Physical activity:    Days per week: 0 days    Minutes per session: Not on file  . Stress: Not at all  Relationships  . Social connections:    Talks on phone: Not on file    Gets together: Not on file    Attends religious  service: Not on file    Active member of club or organization: Not on file    Attends meetings of clubs or organizations: Not on file    Relationship status: Not on file  . Intimate partner violence:    Fear of current or ex partner: No    Emotionally abused: No    Physically abused: No    Forced sexual activity: No  Other Topics Concern  . Not on file  Social History Narrative   She is a homemaker and babysits   Children- 2 daughters    8 grandchildren, 3 great grandchildren    Pets: None   Caffeine- Decaf coffee, rare Dr. Pepper    Enjoys- gardening, cross stitch, and quilt    Review of Systems: See HPI, otherwise negative ROS  Physical Exam: BP 132/84   Pulse 90   Temp (!) 97.3 F (36.3 C) (Tympanic)   Resp 18   Ht 5' 2" (1.575 m)   Wt 66.7 kg   SpO2 96%   BMI 26.89 kg/m  General:   Alert,  pleasant and cooperative in NAD Head:  Normocephalic and atraumatic. Neck:  Supple; no masses or thyromegaly. Lungs:  Clear throughout to auscultation, normal respiratory effort.    Heart:  +S1, +S2, Regular rate and rhythm, No edema. Abdomen:  Soft, nontender and nondistended. Normal bowel sounds, without guarding, and without rebound.   Neurologic:  Alert and  oriented x4;  grossly normal neurologically.  Impression/Plan: Jenny Roberts is here for an colonoscopy to be performed for positive cologuard. Risks, benefits, limitations, and alternatives regarding  colonoscopy have been reviewed with the patient.  Questions have been answered.  All parties agreeable.   Kiran Anna, MD  02/16/2018, 9:31 AM  

## 2018-02-16 NOTE — Anesthesia Preprocedure Evaluation (Addendum)
Anesthesia Evaluation  Patient identified by MRN, date of birth, ID band Patient awake    Reviewed: Allergy & Precautions, NPO status , Patient's Chart, lab work & pertinent test results  Airway Mallampati: III  TM Distance: <3 FB     Dental  (+) Teeth Intact, Caps, Chipped   Pulmonary neg pulmonary ROS,    Pulmonary exam normal        Cardiovascular negative cardio ROS Normal cardiovascular exam     Neuro/Psych PSYCHIATRIC DISORDERS Anxiety Depression    GI/Hepatic negative GI ROS, Neg liver ROS,   Endo/Other  Hypothyroidism   Renal/GU negative Renal ROS  negative genitourinary   Musculoskeletal negative musculoskeletal ROS (+)   Abdominal Normal abdominal exam  (+)   Peds negative pediatric ROS (+)  Hematology negative hematology ROS (+)   Anesthesia Other Findings   Reproductive/Obstetrics                            Anesthesia Physical Anesthesia Plan  ASA: II  Anesthesia Plan: General   Post-op Pain Management:    Induction: Intravenous  PONV Risk Score and Plan: Propofol infusion  Airway Management Planned: Nasal Cannula  Additional Equipment:   Intra-op Plan:   Post-operative Plan:   Informed Consent: I have reviewed the patients History and Physical, chart, labs and discussed the procedure including the risks, benefits and alternatives for the proposed anesthesia with the patient or authorized representative who has indicated his/her understanding and acceptance.     Dental advisory given  Plan Discussed with: CRNA and Surgeon  Anesthesia Plan Comments:         Anesthesia Quick Evaluation

## 2018-02-16 NOTE — Anesthesia Post-op Follow-up Note (Signed)
Anesthesia QCDR form completed.        

## 2018-02-16 NOTE — Telephone Encounter (Signed)
Pt stopped by the office to pick up bowel prep. Pt procedure has been rescheduled to 02-23-18.

## 2018-02-16 NOTE — Anesthesia Postprocedure Evaluation (Signed)
Anesthesia Post Note  Patient: Jenny Roberts  Procedure(s) Performed: COLONOSCOPY WITH PROPOFOL (N/A )  Patient location during evaluation: Endoscopy Anesthesia Type: General Level of consciousness: awake and alert Pain management: pain level controlled Vital Signs Assessment: post-procedure vital signs reviewed and stable Respiratory status: spontaneous breathing, nonlabored ventilation, respiratory function stable and patient connected to nasal cannula oxygen Cardiovascular status: blood pressure returned to baseline and stable Postop Assessment: no apparent nausea or vomiting Anesthetic complications: no     Last Vitals:  Vitals:   02/16/18 1010 02/16/18 1020  BP: 133/72 (!) 155/77  Pulse: 68 69  Resp: 16 11  Temp:    SpO2: 97% 97%    Last Pain:  Vitals:   02/16/18 0950  TempSrc: Tympanic                 Reyden Smith S

## 2018-02-17 ENCOUNTER — Other Ambulatory Visit: Payer: Self-pay | Admitting: Family Medicine

## 2018-02-18 ENCOUNTER — Telehealth: Payer: Self-pay | Admitting: Gastroenterology

## 2018-02-18 ENCOUNTER — Encounter: Payer: Self-pay | Admitting: Gastroenterology

## 2018-02-18 NOTE — Telephone Encounter (Signed)
Pt is calling to cancel her procedure 02/23/18 she needs more time and will call to r/s . She also needs a call to find out she had a colonoscopy but was not cleaned out all the way how will insurance handle this and how much will be respossible for it please call

## 2018-02-18 NOTE — Telephone Encounter (Signed)
Pt procedure has been cancelled. 

## 2018-02-23 ENCOUNTER — Encounter: Admission: RE | Payer: Self-pay | Source: Home / Self Care

## 2018-02-23 ENCOUNTER — Ambulatory Visit: Admission: RE | Admit: 2018-02-23 | Payer: Medicare Other | Source: Home / Self Care | Admitting: Gastroenterology

## 2018-02-23 SURGERY — COLONOSCOPY WITH PROPOFOL
Anesthesia: General

## 2018-03-30 ENCOUNTER — Ambulatory Visit (INDEPENDENT_AMBULATORY_CARE_PROVIDER_SITE_OTHER): Payer: Medicare Other | Admitting: Family Medicine

## 2018-03-30 ENCOUNTER — Encounter: Payer: Self-pay | Admitting: Family Medicine

## 2018-03-30 VITALS — BP 140/96 | HR 77 | Temp 97.9°F | Ht 62.0 in | Wt 147.0 lb

## 2018-03-30 DIAGNOSIS — R195 Other fecal abnormalities: Secondary | ICD-10-CM

## 2018-03-30 DIAGNOSIS — R030 Elevated blood-pressure reading, without diagnosis of hypertension: Secondary | ICD-10-CM

## 2018-03-30 DIAGNOSIS — Z853 Personal history of malignant neoplasm of breast: Secondary | ICD-10-CM

## 2018-03-30 DIAGNOSIS — M25511 Pain in right shoulder: Secondary | ICD-10-CM

## 2018-03-30 DIAGNOSIS — F411 Generalized anxiety disorder: Secondary | ICD-10-CM

## 2018-03-30 DIAGNOSIS — E785 Hyperlipidemia, unspecified: Secondary | ICD-10-CM

## 2018-03-30 DIAGNOSIS — E039 Hypothyroidism, unspecified: Secondary | ICD-10-CM

## 2018-03-30 NOTE — Progress Notes (Addendum)
[name], MD Phone: [phone]  Jenny Roberts is a 73 y.o. female who presents today for follow-up.  CC: Anxiety, elevated blood pressure, right shoulder pain, colonoscopy, history of breast cancer  Anxiety: Patient does note some with her husband recently having had surgery.  She notes no depression.  Clonazepam does not make her drowsy. Continues on zoloft.  Elevated blood pressure: Typically 128-140/80-90.  No chest pain or shortness of breath.  Right shoulder pain: She received a cortisone shot and this helped significantly.  She notes she has not done any physical therapy for this.  Colonoscopy: Notes she was unable to complete this due to issues with prep.  She unsure if her insurance is going to pay for a repeat.  He notes she is going to proceed with a repeat colonoscopy though she is unsure when she is going to complete this.  History of breast cancer: She has been followed by surgery for this.  She notes she had breast cancer in 2001.  She wonders if she can transfer follow-up care to our office to limit the number of physicians she is seeing.    Social History   Tobacco Use  Smoking Status Never Smoker  Smokeless Tobacco Never Used     ROS see history of present illness  Objective  Physical Exam Vitals:   03/30/18 1607 03/30/18 1640  BP: (!) 142/90 (!) 140/96  Pulse: 77   Temp: 97.9 F (36.6 C)   SpO2: 96%     BP Readings from Last 3 Encounters:  03/30/18 (!) 140/96  02/16/18 (!) 155/77  12/26/17 128/72   Wt Readings from Last 3 Encounters:  03/30/18 147 lb (66.7 kg)  02/16/18 147 lb (66.7 kg)  12/26/17 148 lb 12.8 oz (67.5 kg)    Physical Exam Constitutional:      General: She is not in acute distress.    Appearance: She is not diaphoretic.  Cardiovascular:     Rate and Rhythm: Normal rate and regular rhythm.     Heart sounds: Normal heart sounds.  Pulmonary:     Effort: Pulmonary effort is normal.     Breath sounds: Normal breath  sounds.  Musculoskeletal:     Right lower leg: No edema.     Left lower leg: No edema.  Skin:    General: Skin is warm and dry.  Neurological:     Mental Status: She is alert.      Assessment/Plan: Please see individual problem list.  Elevated blood pressure reading without diagnosis of hypertension The patient will start checking her blood pressure at home.  She will return in 2 to 3 weeks for nurse visit for a blood pressure check.  Personal history of malignant neoplasm of breast I will send a message to the patient's surgeon to determine if she should continue to see them for follow-up.  Anxiety state Stable.  Continue current regimen.  Right shoulder pain Patient reports improvement with injection.  We will have her complete physical therapy.  Positive colorectal cancer screening using Cologuard test I encouraged the patient to proceed with repeat colonoscopy given risk of an underlying polyp or cancer.  She verbalized understanding.   Orders Placed This Encounter  Procedures  . Comp Met (CMET)  . Lipid panel  . TSH  . Ambulatory referral to Physical Therapy    Referral Priority:   Routine    Referral Type:   Physical Medicine    Referral Reason:   Specialty Services Required  Requested Specialty:   Physical Therapy    Number of Visits Requested:   1    No orders of the defined types were placed in this encounter.    [name], MD [facility] Follow

## 2018-03-30 NOTE — Patient Instructions (Signed)
Nice to see you. Please start checking your blood pressure at home.  We will have you return in 2 to 3 weeks for nurse BP check.  Please bring your readings and blood pressure cuff in. We will have you do physical therapy for your shoulder. We will get lab work today and contact you with the results.

## 2018-03-31 LAB — LIPID PANEL
Cholesterol: 149 mg/dL (ref 0–200)
HDL: 52.8 mg/dL (ref >39.00)
LDL Cholesterol: 72 mg/dL (ref 0–99)
NonHDL: 95.84
Total CHOL/HDL Ratio: 3
Triglycerides: 117 mg/dL (ref 0.0–149.0)
VLDL: 23.4 mg/dL (ref 0.0–40.0)

## 2018-03-31 LAB — TSH: TSH: 5.38 u[IU]/mL — ABNORMAL HIGH (ref 0.35–4.50)

## 2018-03-31 LAB — COMPREHENSIVE METABOLIC PANEL
ALT: 20 U/L (ref 0–35)
AST: 21 U/L (ref 0–37)
Albumin: 4.6 g/dL (ref 3.5–5.2)
Alkaline Phosphatase: 90 U/L (ref 39–117)
BUN: 27 mg/dL — ABNORMAL HIGH (ref 6–23)
CO2: 25 meq/L (ref 19–32)
CREATININE: 0.72 mg/dL (ref 0.40–1.20)
Calcium: 9.9 mg/dL (ref 8.4–10.5)
Chloride: 108 mEq/L (ref 96–112)
GFR: 79.41 mL/min (ref >60.00)
Glucose, Bld: 87 mg/dL (ref 70–99)
Potassium: 4.4 mEq/L (ref 3.5–5.1)
SODIUM: 142 meq/L (ref 135–145)
Total Bilirubin: 0.3 mg/dL (ref 0.2–1.2)
Total Protein: 6.7 g/dL (ref 6.0–8.3)

## 2018-04-04 ENCOUNTER — Other Ambulatory Visit: Payer: Self-pay | Admitting: Family Medicine

## 2018-04-06 DIAGNOSIS — R030 Elevated blood-pressure reading, without diagnosis of hypertension: Secondary | ICD-10-CM | POA: Insufficient documentation

## 2018-04-06 DIAGNOSIS — R195 Other fecal abnormalities: Secondary | ICD-10-CM | POA: Insufficient documentation

## 2018-04-06 DIAGNOSIS — M25511 Pain in right shoulder: Secondary | ICD-10-CM | POA: Insufficient documentation

## 2018-04-06 NOTE — Assessment & Plan Note (Signed)
Stable  Continue current regimen  

## 2018-04-06 NOTE — Assessment & Plan Note (Signed)
The patient will start checking her blood pressure at home.  She will return in 2 to 3 weeks for nurse visit for a blood pressure check.

## 2018-04-06 NOTE — Assessment & Plan Note (Signed)
Patient reports improvement with injection.  We will have her complete physical therapy.

## 2018-04-06 NOTE — Assessment & Plan Note (Signed)
I will send a message to the patient's surgeon to determine if she should continue to see them for follow-up.

## 2018-04-06 NOTE — Assessment & Plan Note (Signed)
I encouraged the patient to proceed with repeat colonoscopy given risk of an underlying polyp or cancer.  She verbalized understanding.

## 2018-04-13 ENCOUNTER — Other Ambulatory Visit: Payer: Self-pay | Admitting: Family Medicine

## 2018-04-13 DIAGNOSIS — E039 Hypothyroidism, unspecified: Secondary | ICD-10-CM

## 2018-04-13 MED ORDER — LEVOTHYROXINE SODIUM 112 MCG PO TABS: 112.0000 ug | ORAL_TABLET | Freq: Every day | ORAL | 1 refills | Status: DC

## 2018-04-22 ENCOUNTER — Ambulatory Visit: Payer: No Typology Code available for payment source

## 2018-04-22 ENCOUNTER — Other Ambulatory Visit: Payer: Self-pay

## 2018-04-22 ENCOUNTER — Ambulatory Visit (INDEPENDENT_AMBULATORY_CARE_PROVIDER_SITE_OTHER): Payer: Medicare Other

## 2018-04-22 VITALS — BP 148/80 | HR 70

## 2018-04-22 DIAGNOSIS — R030 Elevated blood-pressure reading, without diagnosis of hypertension: Secondary | ICD-10-CM

## 2018-04-22 NOTE — Progress Notes (Signed)
Patient here for nurse visit BP check per order from Dr. Caryl Bis.     BP Readings from Last 3 Encounters:  03/30/18 (!) 140/96  02/16/18 (!) 155/77  12/26/17 128/72   Pulse Readings from Last 3 Encounters:  03/30/18 77  02/16/18 69  12/26/17 76   Readings werr given to me from patient,  Patient took BP on her own device reading was as followed:  BP 148/80                               Pulse 78   Gordy Councilman, CMA

## 2018-04-23 NOTE — Progress Notes (Signed)
BP is elevated in the office though is well controlled at home based on review of her readings. Ranges between 129-140/65-90, with most of her systolics in the 786L and diatsolics in the 54G. She should continue to monitor her BP periodically at home. No need for medication at this time.

## 2018-04-24 NOTE — Progress Notes (Signed)
Called and informed the pt to keep taking a log of her BP and no medication needed at this time.  Pt undesrtood.  Doneisha Ivey,cma

## 2018-05-05 ENCOUNTER — Other Ambulatory Visit: Payer: Self-pay | Admitting: Family Medicine

## 2018-05-06 NOTE — Telephone Encounter (Signed)
Last OV 03/30/2018  Last refilled 04/06/2018 disp 30 with no refills   Next OV 08/21/2018  Sent to PCP for approval

## 2018-05-24 ENCOUNTER — Other Ambulatory Visit: Payer: Self-pay | Admitting: Family Medicine

## 2018-05-25 ENCOUNTER — Other Ambulatory Visit: Payer: Self-pay

## 2018-05-25 ENCOUNTER — Ambulatory Visit: Payer: Self-pay | Admitting: *Deleted

## 2018-05-25 ENCOUNTER — Encounter: Payer: Self-pay | Admitting: Family Medicine

## 2018-05-25 ENCOUNTER — Ambulatory Visit (INDEPENDENT_AMBULATORY_CARE_PROVIDER_SITE_OTHER): Payer: Medicare Other | Admitting: Family Medicine

## 2018-05-25 DIAGNOSIS — R05 Cough: Secondary | ICD-10-CM | POA: Diagnosis not present

## 2018-05-25 DIAGNOSIS — R059 Cough, unspecified: Secondary | ICD-10-CM

## 2018-05-25 MED ORDER — BENZONATATE 200 MG PO CAPS: 200.0000 mg | ORAL_CAPSULE | Freq: Two times a day (BID) | ORAL | 0 refills | Status: DC | PRN

## 2018-05-25 NOTE — Telephone Encounter (Signed)
Called and spoke with pt and scheduled her an appointment with her provider today.  Jenny Roberts,cma

## 2018-05-25 NOTE — Progress Notes (Addendum)
Virtual Visit via telephone Note  This visit type was conducted due to national recommendations for restrictions regarding the COVID-19 pandemic (e.g. social distancing).  This format is felt to be most appropriate for this patient at this time.  All issues noted in this document were discussed and addressed.  No physical exam was performed (except for noted visual exam findings with Video Visits).   I connected with Jenny Roberts on 05/25/18 at  3:15 PM EDT by telephone and verified that I am speaking with the correct person using two identifiers. Location patient: home Location provider: work Persons participating in the virtual visit: patient, provider, Amy Iona Beard (Daughter)  I discussed the limitations, risks, security and privacy concerns of performing an evaluation and management service by telephone and the availability of in person appointments. I also discussed with the patient that there may be a patient responsible charge related to this service. The patient expressed understanding and agreed to proceed.  Interactive audio and video telecommunications were attempted between this provider and patient, however failed, due to patient having technical difficulties OR patient did not have access to video capability.  We continued and completed visit with audio only.  Reason for visit: Same day visit.  HPI: Cough: Patient notes this started 5 days ago.  She notes some sinus congestion.  She notes no postnasal drip.  She notes no fever though she has been taking Tylenol every 4-6 hours.  Cough is productive of yellow-green mucus.  She has been taking Mucinex for this.  She does note her daughter and granddaughter were diagnosed with bronchitis and tested negative for COVID-19.  She has no known COVID-19 exposures.  She has had no travel.  She has had some scratchy throat though no sore throat.  No dyspnea.   ROS: See pertinent positives and negatives per HPI.  Past Medical History:   Diagnosis Date  . Anxiety   . Breast cancer ([place]) 2001   Right. Treated with lumpectomy followed by Chemotherapy and Radiation therapy  . Depression   . Hyperlipidemia   . Nontraumatic rupture of tendon of thumb 06/15/2014  . Skin cancer 09/2004   treated at [facility]  . Thyroid disease     Past Surgical History:  Procedure Laterality Date  . ABDOMINAL HYSTERECTOMY  1983   Left parts of each ovary   . BREAST BIOPSY Right 2001   positive  . BREAST EXCISIONAL BIOPSY Right 1990's   benign  . 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  Focal positive posterior margin.  0/5 lymph nodes.  . CARPAL TUNNEL RELEASE  2016  . COLONOSCOPY  2008  . COLONOSCOPY WITH PROPOFOL N/A 02/16/2018   Procedure: COLONOSCOPY WITH PROPOFOL;  Surgeon: Jonathon Bellows, MD;  Location: [facility] ENDOSCOPY;  Service: Endoscopy;  Laterality: N/A;  . KNEE SURGERY  2003  . mole excision  2006   Basal cell   . WRIST SURGERY Right 2010    Family History  Problem Relation Age of Onset  . Brain cancer Father   . Cancer Maternal Grandfather 48       Colon Cancer    SOCIAL HX: Non-smoker.   Current Outpatient Medications:  .  acetaminophen (TYLENOL) 500 MG tablet, Take by mouth., Disp: , Rfl:  .  cholecalciferol (VITAMIN D) 1000 units tablet, Take 1,000 Units by mouth daily., Disp: , Rfl:  .  clonazePAM (KLONOPIN) 0.5 MG tablet, TAKE 1 TABLET(0.5 MG) BY  MOUTH DAILY, Disp: 30 tablet, Rfl: 1 .  famotidine (PEPCID) 20 MG tablet, Take 20 mg by mouth 3 times/day as needed-between meals & bedtime for heartburn or indigestion., Disp: , Rfl:  .  fluticasone (FLONASE) 50 MCG/ACT nasal spray, Place 2 sprays into both nostrils daily. (Patient taking differently: Place 2 sprays into both nostrils daily as needed for allergies. ), Disp: 16 g, Rfl: 6 .  ibuprofen (ADVIL,MOTRIN) 200 MG tablet, Take by mouth., Disp: , Rfl:  .  levothyroxine (SYNTHROID,  LEVOTHROID) 112 MCG tablet, Take 1 tablet (112 mcg total) by mouth daily., Disp: 90 tablet, Rfl: 1 .  lovastatin (MEVACOR) 40 MG tablet, TAKE 1 TABLET BY MOUTH EVERY DAY, Disp: 90 tablet, Rfl: 0 .  naproxen (NAPROSYN) 500 MG tablet, , Disp: , Rfl:  .  sertraline (ZOLOFT) 100 MG tablet, Take 1 tablet (100 mg total) by mouth daily., Disp: 90 tablet, Rfl: 1 .  benzonatate (TESSALON) 200 MG capsule, Take 1 capsule (200 mg total) by mouth 2 (two) times daily as needed for cough., Disp: 20 capsule, Rfl: 0  EXAM: This was a telehealth telephone visit and thus no physical exam was completed.  The patient did not appear to be short of breath over the phone and was speaking in full sentences.  ASSESSMENT AND PLAN:  Discussed the following assessment and plan:  Cough  Cough Symptoms are most consistent with a viral illness.  Suspect viral URI or viral bronchitis.  I did discuss that there is a small chance that this could represent COVID-19.  Discussed that she was not ill enough to be evaluated in person.  Discussed isolation precautions for her and her husband that should last for at least 7 days and that she would need to be improving for 3 consecutive days and be afebrile without the use of Tylenol or ibuprofen over those 3 days prior to ending her isolation precautions.  Discussed reasons to seek medical attention in person.  Discussed that if her husband developed symptoms that he should be evaluated by his physician.  We will treat cough with Tessalon.  Advised that she have 1 of her family members who has not had contact with them to pick this up through the drive-through and drop it off at her house.  Advised that she should keep track of anyone that has contact with her.  We will have a follow-up call to her by the [facility] on Friday to see how the patient is feeling.  We will reschedule her TSH check from next week to a future date.    I discussed the assessment and treatment plan with the patient. The  patient was provided an opportunity to ask questions and all were answered. The patient agreed with the plan and demonstrated an understanding of the instructions.   The patient was advised to call back or seek an in-person evaluation if the symptoms worsen or if the condition fails to improve as anticipated.  I provided 15 minutes of non-face-to-face time during this encounter.   [name], MD

## 2018-05-25 NOTE — Telephone Encounter (Signed)
Patient states she has cough and is losing voice.Patient triage with COVID protocol and made virtual visit with provider to address cough. Patient is using Musinex- not better.  Reason for Disposition . HIGH RISK patient (e.g., age > 92 years, diabetes, heart or lung disease, weak immune system)  Answer Assessment - Initial Assessment Questions 1. COVID-19 DIAGNOSIS: "Who made your Coronavirus (COVID-19) diagnosis?" "Was it confirmed by a positive lab test?" If not diagnosed by a HCP, ask "Are there lots of cases (community spread) where you live?" (See public health department website, if unsure)   * MAJOR community spread: high number of cases; numbers of cases are increasing; many people hospitalized.   * MINOR community spread: low number of cases; not increasing; few or no people hospitalized     Minor- community spread 2. ONSET: "When did the COVID-19 symptoms start?"      Cough- 1 week- Thr/Fri of last week 3. WORST SYMPTOM: "What is your worst symptom?" (e.g., cough, fever, shortness of breath, muscle aches)     cough 4. COUGH: "Do you have a cough?" If so, ask: "How bad is the cough?"       Cough- lose cough- upper chest- productive- greenish-yellow 5. FEVER: "Do you have a fever?" If so, ask: "What is your temperature, how was it measured, and when did it start?"     No fever 6. RESPIRATORY STATUS: "Describe your breathing?" (e.g., shortness of breath, wheezing, unable to speak)      Losing voice 7. BETTER-SAME-WORSE: "Are you getting better, staying the same or getting worse compared to yesterday?"  If getting worse, ask, "In what way?"     Staying the same 8. HIGH RISK DISEASE: "Do you have any chronic medical problems?" (e.g., asthma, heart or lung disease, weak immune system, etc.)     Cancer survivor 9. PREGNANCY: "Is there any chance you are pregnant?" "When was your last menstrual period?"     n/a 10. OTHER SYMPTOMS: "Do you have any other symptoms?"  (e.g., runny nose,  headache, sore throat, loss of smell)       headache  Protocols used: CORONAVIRUS (COVID-19) DIAGNOSED OR SUSPECTED-A-AH

## 2018-05-26 ENCOUNTER — Telehealth: Payer: Self-pay | Admitting: Family Medicine

## 2018-05-26 DIAGNOSIS — R05 Cough: Secondary | ICD-10-CM | POA: Insufficient documentation

## 2018-05-26 DIAGNOSIS — R059 Cough, unspecified: Secondary | ICD-10-CM | POA: Insufficient documentation

## 2018-05-26 NOTE — Telephone Encounter (Signed)
Please contact the patient on Friday and see how she is feeling with regards to her respiratory illness.  Please also get her lab appointment rescheduled for 2 weeks from now.  Please also let her know that I checked with our clinical RN and at this time we are not doing any COVID-19 testing in the outpatient setting.  Thanks.

## 2018-05-26 NOTE — Assessment & Plan Note (Signed)
Symptoms are most consistent with a viral illness.  Suspect viral URI or viral bronchitis.  I did discuss that there is a small chance that this could represent COVID-19.  Discussed that she was not ill enough to be evaluated in person.  Discussed isolation precautions for her and her husband that should last for at least 7 days and that she would need to be improving for 3 consecutive days and be afebrile without the use of Tylenol or ibuprofen over those 3 days prior to ending her isolation precautions.  Discussed reasons to seek medical attention in person.  Discussed that if her husband developed symptoms that he should be evaluated by his physician.  We will treat cough with Tessalon.  Advised that she have 1 of her family members who has not had contact with them to pick this up through the drive-through and drop it off at her house.  Advised that she should keep track of anyone that has contact with her.  We will have a follow-up call to her by the [facility] on Friday to see how the patient is feeling.  We will reschedule her TSH check from next week to a future date.

## 2018-05-26 NOTE — Telephone Encounter (Signed)
Called and spoke with pt today. Pt advised and voiced understanding. Pt has been scheduled for lab work for May 22 @ 8:45 AM. Pt stated that she is feeling better temp this morning was 99.6 and later when she rechecked it went down to 98.3.   Pt is aware that we are not doing any COVID-19 testing here at our office but we will inform her where to go if testing is needed.   Will keep this and call pt everyday to check in.

## 2018-05-27 NOTE — Telephone Encounter (Signed)
Called pt and was unable to leave a VM.

## 2018-05-29 NOTE — Telephone Encounter (Signed)
Called and spoke with patient she still is not feeling well. Temp. today was 99.2 F pt is coughing and sounds SOB on the phone but claims that she is NOT SOB just struggling to talk. Pt is still c/o chest congestion and struggling to get the mucus out still taking the tessalon perles . No cold chills. Pt stated that her body aches but feels that it due to old age.  Pt sounds pretty bad on the phone and she stated that she would like to be tested for COVID-19 [place] and I know there is a tent set up for ppl who are live in the [place] area.   Sent to PCP to advise

## 2018-05-29 NOTE — Telephone Encounter (Signed)
Noted. Please follow-up with the patient on Monday and find out if she was able to get tested. Thanks.

## 2018-05-29 NOTE — Telephone Encounter (Signed)
Called the number listed and they stated that all pt will need to do is call to answer a few questions and to set up the appt it is drive through testing pt will need insurance id card when arriving.   Call and spoke with pt. Pt advised to call [phone].

## 2018-05-29 NOTE — Telephone Encounter (Signed)
Spoke with patient. She has not had any dyspnea. She notes hoarseness from her throat. She has not had any difficulty breathing or swallowing. She has continued to have cough though no true fevers. I think it would be reasonable for her to be tested for [place] given her persistent symptoms and that she lives in [facility] Please call the following number to see what we need to do to get the patient tested: [phone]. Please find out if I need to place some kind of order. Do they need patient information? Does she need an appointment? What is their protocol for when the patient arrives? Does she need to have her husband drive with her? Thanks.

## 2018-06-01 ENCOUNTER — Other Ambulatory Visit: Payer: Medicare Other

## 2018-06-01 DIAGNOSIS — Z20828 Contact with and (suspected) exposure to other viral communicable diseases: Secondary | ICD-10-CM | POA: Diagnosis not present

## 2018-06-01 NOTE — Telephone Encounter (Signed)
Pt states that she would like a call back from the nurse. Pt states that she has some additional questions she would like answered. She would like to know when she can come out of quarantine. Please advise

## 2018-06-02 NOTE — Telephone Encounter (Signed)
Patient was concerned wanted to know would an antibiotic help her , she is no worse but not feeling better. Patient was able to speak in full sentences but still SOB and fatigue , no fever today so far. Patient was tested for COVID she said on 06/01/18 was advised test would be back in 2- 3 days, advised to speak with PCP concerning results, and stopping quarantine. Advised patient can take up to 28 days for all symptoms to resolve. Reminded patient on home monitoring and if SOB worsened or chest pain she would need to be evaluated at ED or UC again. Patient still would like to know if antibiotic would help.

## 2018-06-02 NOTE — Telephone Encounter (Signed)
Pt's husband called and feels his wife is being ignored. Does not know what to do as far as treatment, currently no treatment. Please call him at [phone].

## 2018-06-02 NOTE — Telephone Encounter (Signed)
At this time I would not use an antibiotic. If her COVID19 test comes back negative I think we could proceed with an antibiotic if she is not improving.

## 2018-06-02 NOTE — Telephone Encounter (Signed)
Spoke with patient spouse DPR and notified that antibiotic not recommended at this time and that because of his proximity to patient and exposure even without symptoms he should quarantine until results are back.

## 2018-06-08 ENCOUNTER — Telehealth: Payer: Self-pay | Admitting: Family Medicine

## 2018-06-08 DIAGNOSIS — J40 Bronchitis, not specified as acute or chronic: Secondary | ICD-10-CM | POA: Diagnosis not present

## 2018-06-08 DIAGNOSIS — J22 Unspecified acute lower respiratory infection: Secondary | ICD-10-CM | POA: Diagnosis not present

## 2018-06-08 DIAGNOSIS — R05 Cough: Secondary | ICD-10-CM | POA: Diagnosis not present

## 2018-06-08 NOTE — Telephone Encounter (Signed)
Noted. Thank you for calling them back.

## 2018-06-08 NOTE — Telephone Encounter (Signed)
Called and advised patient after hearing her voice that she should be seen at Urgent care patient ask if she could go to [place] [facility] in advised that I feel with hearing her pause between words to catch breath she should be seen immediately.

## 2018-06-08 NOTE — Telephone Encounter (Signed)
Noted. Are we going to get a copy of the results? I am happy to send in an inhaler for her to use given the wheezing though it sounds as though she really needs to be seen in person at an urgent care so she can have a chest x-ray to evaluate for other causes. If she does not want to be evaluated at an urgent care we can do the doxy visit tomorrow and try to arrange for a CXR at the hospital.

## 2018-06-08 NOTE — Telephone Encounter (Signed)
Talked with patient spouse DPR patient is still having a terrible cough dry , and patient is wheezing , SOB with exertion, patient was tested last Monday at  [facility] for Covid 19 has not been able to attain results, patient requesting something for cough and for Wheezing scheduled Doxy with PCP for 06/09/18 at 11. Gave patient number to [place] for testing results.

## 2018-06-08 NOTE — Telephone Encounter (Signed)
Called back an spoke to patient spouse to confirm going to UC he said yes in 2 minutes heading to [employer]. I will check to see if I can get copy of COVID testing.

## 2018-06-08 NOTE — Telephone Encounter (Signed)
Patient just received test resulst back from [facility] for Covid and the resutl was negative, but please see note below patient is just wanting to get something to help symptoms until tomorrow visit.

## 2018-06-08 NOTE — Telephone Encounter (Signed)
Pt. Reports she had COVID 19 testing through [facility] testing. Gae Bon in the practice does not have results - instructed pt. To call Health Dept. Number given.

## 2018-06-09 ENCOUNTER — Telehealth: Payer: Self-pay

## 2018-06-09 ENCOUNTER — Ambulatory Visit: Payer: No Typology Code available for payment source | Admitting: Family Medicine

## 2018-06-09 NOTE — Telephone Encounter (Signed)
Called and spoke with pt. Pt had an appt today [facility] but will reschedule for a later appt since she was seen at an urgent care yesterday. Pt stated that she will call back later to reschedule the follow up appt.   Pt was retested for COVID-19 yesterday was test prior and it was negative.   Pt stated that she wanted to know should she keep her lab appt for 5/22 or reschedule that as well it is to recheck her TSH and she is concern since she is on so much medication right now.  Please advise

## 2018-06-09 NOTE — Telephone Encounter (Signed)
She does not need to keep the lab appointment.  This needs to be rescheduled for 4-6 weeks from now.  Her current illness would likely throw off her TSH result.

## 2018-06-09 NOTE — Telephone Encounter (Signed)
Pt now scheduled for labs on July 21, 2018

## 2018-06-09 NOTE — Telephone Encounter (Signed)
Called and spoke with pt. Pt advised and voiced understanding. Cancel appt for 5/22.

## 2018-06-12 ENCOUNTER — Other Ambulatory Visit: Payer: No Typology Code available for payment source

## 2018-06-30 ENCOUNTER — Other Ambulatory Visit: Payer: Self-pay | Admitting: Family Medicine

## 2018-07-03 ENCOUNTER — Telehealth: Payer: Self-pay | Admitting: Family Medicine

## 2018-07-03 NOTE — Telephone Encounter (Signed)
Copied from [place] [id number]. Topic: Quick Communication - Rx Refill/Question >> Jul 03, 2018  6:08 PM Gustavus Messing wrote: Medication: clonazePAM (KLONOPIN) 0.5 MG tablet   Has the patient contacted their pharmacy? Yes.   (Agent: If yes, when and what did the pharmacy advise?) The patient stated that she has has been trying to get this medication from the pharmacy with no luck. She needs a refill.  Preferred Pharmacy (with phone number or street name): [id number] - [place], [place] - [address] [phone] (Phone) [phone] (Fax)    Agent: Please be advised that RX refills may take up to 3 business days. We ask that you follow-up with your pharmacy.

## 2018-07-06 ENCOUNTER — Telehealth: Payer: Self-pay

## 2018-07-06 MED ORDER — CLONAZEPAM 0.5 MG PO TABS: ORAL_TABLET | ORAL | 1 refills | Status: DC

## 2018-07-06 NOTE — Telephone Encounter (Signed)
Refill sent to pharmacy.  Controlled substance database reviewed.

## 2018-07-21 ENCOUNTER — Other Ambulatory Visit (INDEPENDENT_AMBULATORY_CARE_PROVIDER_SITE_OTHER): Payer: Medicare Other

## 2018-07-21 ENCOUNTER — Other Ambulatory Visit: Payer: Self-pay

## 2018-07-21 DIAGNOSIS — E039 Hypothyroidism, unspecified: Secondary | ICD-10-CM

## 2018-07-21 LAB — TSH: TSH: 0.02 u[IU]/mL — ABNORMAL LOW (ref 0.35–4.50)

## 2018-07-28 ENCOUNTER — Telehealth: Payer: Self-pay

## 2018-07-28 DIAGNOSIS — E039 Hypothyroidism, unspecified: Secondary | ICD-10-CM

## 2018-07-28 MED ORDER — LEVOTHYROXINE SODIUM 100 MCG PO TABS: 100.0000 ug | ORAL_TABLET | Freq: Every day | ORAL | 1 refills | Status: DC

## 2018-07-28 NOTE — Telephone Encounter (Signed)
Patient is scheduled for labs in 8 weeks.  Nina,cma

## 2018-07-28 NOTE — Telephone Encounter (Signed)
Copied from [place] [id number]. Topic: General - Inquiry >> Jul 28, 2018 10:44 AM Reyne Dumas L wrote: Reason for CRM:   Pt wants to know if Thyroid labs are back and if she should stay on the same dose of medication.

## 2018-07-28 NOTE — Telephone Encounter (Signed)
New dose sent to pharmacy.  Patient will need her TSH rechecked in 8 weeks.  Orders placed.  Please get her scheduled.  Thanks.

## 2018-07-28 NOTE — Telephone Encounter (Signed)
I called patient and gave her the TSH results, and she is ok with the decrease so you can send the new Rx to [facility] on [address].  Railynn Ballo,cma

## 2018-08-11 ENCOUNTER — Encounter: Payer: Self-pay | Admitting: General Surgery

## 2018-08-21 ENCOUNTER — Other Ambulatory Visit: Payer: Self-pay | Admitting: Family Medicine

## 2018-08-21 ENCOUNTER — Ambulatory Visit (INDEPENDENT_AMBULATORY_CARE_PROVIDER_SITE_OTHER): Payer: Medicare Other | Admitting: Family Medicine

## 2018-08-21 ENCOUNTER — Telehealth: Payer: Self-pay

## 2018-08-21 ENCOUNTER — Other Ambulatory Visit: Payer: Self-pay

## 2018-08-21 ENCOUNTER — Encounter: Payer: Self-pay | Admitting: Family Medicine

## 2018-08-21 ENCOUNTER — Ambulatory Visit (INDEPENDENT_AMBULATORY_CARE_PROVIDER_SITE_OTHER): Payer: Medicare Other

## 2018-08-21 VITALS — Ht 61.0 in | Wt 142.0 lb

## 2018-08-21 DIAGNOSIS — E039 Hypothyroidism, unspecified: Secondary | ICD-10-CM

## 2018-08-21 DIAGNOSIS — R195 Other fecal abnormalities: Secondary | ICD-10-CM | POA: Diagnosis not present

## 2018-08-21 DIAGNOSIS — Z0000 Encounter for general adult medical examination without abnormal findings: Secondary | ICD-10-CM

## 2018-08-21 DIAGNOSIS — Z853 Personal history of malignant neoplasm of breast: Secondary | ICD-10-CM | POA: Diagnosis not present

## 2018-08-21 DIAGNOSIS — F411 Generalized anxiety disorder: Secondary | ICD-10-CM | POA: Diagnosis not present

## 2018-08-21 DIAGNOSIS — E785 Hyperlipidemia, unspecified: Secondary | ICD-10-CM | POA: Diagnosis not present

## 2018-08-21 DIAGNOSIS — Z1231 Encounter for screening mammogram for malignant neoplasm of breast: Secondary | ICD-10-CM

## 2018-08-21 NOTE — Assessment & Plan Note (Signed)
Mild symptoms.  Continue current regimen.  Monitor for worsening.

## 2018-08-21 NOTE — Telephone Encounter (Signed)
I will attempt to contact patient again today at 1:30.

## 2018-08-21 NOTE — Patient Instructions (Addendum)
  Jenny Roberts , Thank you for taking time to come for your Medicare Wellness Visit. I appreciate your ongoing commitment to your health goals. Please review the following plan we discussed and let me know if I can assist you in the future.   These are the goals we discussed: Goals      Patient Stated   . DIET - INCREASE WATER INTAKE (pt-stated)     Weight less than 140lb       This is a list of the screening recommended for you and due dates:  Health Maintenance  Topic Date Due  . Tetanus Vaccine  04/08/1964  . Flu Shot  08/22/2018  . Mammogram  11/04/2018  . Colon Cancer Screening  02/17/2028  . DEXA scan (bone density measurement)  Completed  .  Hepatitis C: One time screening is recommended by Center for Disease Control  (CDC) for  adults born from [birth year] through 1965.   Completed  . Pneumonia vaccines  Completed

## 2018-08-21 NOTE — Assessment & Plan Note (Signed)
Patient's surgeon has left the practice.  Discussed yearly breast exam and mammogram yearly.  Order placed.  Patient will call to schedule.

## 2018-08-21 NOTE — Assessment & Plan Note (Signed)
Due for repeat TSH in about a month.  She will keep her appointment for that.  Continue Synthroid.

## 2018-08-21 NOTE — Progress Notes (Signed)
Virtual Visit via telephone Note  This visit type was conducted due to national recommendations for restrictions regarding the COVID-19 pandemic (e.g. social distancing).  This format is felt to be most appropriate for this patient at this time.  All issues noted in this document were discussed and addressed.  No physical exam was performed (except for noted visual exam findings with Video Visits).   I connected with Jenny Roberts today at 11:00 AM EDT by telephone and verified that I am speaking with the correct person using two identifiers. Location patient: home Location provider: work Persons participating in the virtual visit: patient, provider  I discussed the limitations, risks, security and privacy concerns of performing an evaluation and management service by telephone and the availability of in person appointments. I also discussed with the patient that there may be a patient responsible charge related to this service. The patient expressed understanding and agreed to proceed.  Interactive audio and video telecommunications were attempted between this provider and patient, however failed, due to patient having technical difficulties OR patient did not have access to video capability.  We continued and completed visit with audio only.   Reason for visit: follow-up  HPI: Hyperlipidemia: Taking lovastatin.  No chest pain, claudication, right upper quadrant pain, or myalgias.  Hypothyroidism: Taking Synthroid.  No skin changes or heat or cold intolerance.  Anxiety: She has some days are better than others.  The COVID-19 pandemic is weighing on her to a certain degree.  She is unsure of what comes next and has some worries regarding this.  Taking Zoloft and clonazepam with good benefit.  No depression.  No drowsiness or alcohol intake with the clonazepam.  History of breast cancer: She has a history of breast cancer in 2001.  Her surgeon has left the practice she was following with.   She is due for mammogram in October.  Positive Cologuard: Patient has deferred follow-up colonoscopy after the first 1 was inadequate for evaluation.  She has deferred this due to the COVID-19 pandemic.     ROS: See pertinent positives and negatives per HPI.  Past Medical History:  Diagnosis Date  . Anxiety   . Breast cancer ([place]) 2001   Right. Treated with lumpectomy followed by Chemotherapy and Radiation therapy  . Depression   . Hyperlipidemia   . Nontraumatic rupture of tendon of thumb 06/15/2014  . Skin cancer 09/2004   treated at [facility]  . Thyroid disease     Past Surgical History:  Procedure Laterality Date  . ABDOMINAL HYSTERECTOMY  1983   Left parts of each ovary   . BREAST BIOPSY Right 2001   positive  . BREAST EXCISIONAL BIOPSY Right 1990's   benign  . 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  Focal positive posterior margin.  0/5 lymph nodes.  . CARPAL TUNNEL RELEASE  2016  . COLONOSCOPY  2008  . COLONOSCOPY WITH PROPOFOL N/A 02/16/2018   Procedure: COLONOSCOPY WITH PROPOFOL;  Surgeon: Jonathon Bellows, MD;  Location: [facility] ENDOSCOPY;  Service: Endoscopy;  Laterality: N/A;  . KNEE SURGERY  2003  . mole excision  2006   Basal cell   . WRIST SURGERY Right 2010    Family History  Problem Relation Age of Onset  . Brain cancer Father   . Cancer Maternal Grandfather 20       Colon Cancer    SOCIAL HX: Non-smoker.   Current Outpatient  Medications:  .  acetaminophen (TYLENOL) 500 MG tablet, Take by mouth., Disp: , Rfl:  .  benzonatate (TESSALON) 200 MG capsule, Take 1 capsule (200 mg total) by mouth 2 (two) times daily as needed for cough., Disp: 20 capsule, Rfl: 0 .  cholecalciferol (VITAMIN D) 1000 units tablet, Take 1,000 Units by mouth daily., Disp: , Rfl:  .  clonazePAM (KLONOPIN) 0.5 MG tablet, TAKE 1 TABLET(0.5 MG) BY MOUTH DAILY, Disp: 30 tablet, Rfl: 1 .  famotidine  (PEPCID) 20 MG tablet, Take 20 mg by mouth 3 times/day as needed-between meals & bedtime for heartburn or indigestion., Disp: , Rfl:  .  fluticasone (FLONASE) 50 MCG/ACT nasal spray, Place 2 sprays into both nostrils daily. (Patient taking differently: Place 2 sprays into both nostrils daily as needed for allergies. ), Disp: 16 g, Rfl: 6 .  ibuprofen (ADVIL,MOTRIN) 200 MG tablet, Take by mouth., Disp: , Rfl:  .  levothyroxine (SYNTHROID) 100 MCG tablet, Take 1 tablet (100 mcg total) by mouth daily., Disp: 90 tablet, Rfl: 1 .  lovastatin (MEVACOR) 40 MG tablet, TAKE 1 TABLET BY MOUTH EVERY DAY, Disp: 90 tablet, Rfl: 0 .  naproxen (NAPROSYN) 500 MG tablet, , Disp: , Rfl:  .  sertraline (ZOLOFT) 100 MG tablet, Take 1 tablet (100 mg total) by mouth daily., Disp: 90 tablet, Rfl: 1  EXAM: This was a telehealth telephone visit and thus no physical exam was completed.  Patient reports BP was 132/70.  ASSESSMENT AND PLAN:  Discussed the following assessment and plan:  Hypothyroidism Due for repeat TSH in about a month.  She will keep her appointment for that.  Continue Synthroid.  Anxiety state Mild symptoms.  Continue current regimen.  Monitor for worsening.  Personal history of malignant neoplasm of breast Patient's surgeon has left the practice.  Discussed yearly breast exam and mammogram yearly.  Order placed.  Patient will call to schedule.  Positive colorectal cancer screening using Cologuard test Patient continues to want to defer colonoscopy relating to the COVID-19 pandemic.  I discussed the risk of missing a precancerous colon polyp or a colon cancer if she does not proceed with colonoscopy.  I encouraged her to proceed with colonoscopy due to this risk though she wants to defer it until later this year depending on what the COVID-19 pandemic does.    I discussed the assessment and treatment plan with the patient. The patient was provided an opportunity to ask questions and all were  answered. The patient agreed with the plan and demonstrated an understanding of the instructions.   The patient was advised to call back or seek an in-person evaluation if the symptoms worsen or if the condition fails to improve as anticipated.  I provided 17 minutes of non-face-to-face time during this encounter.   [name], MD

## 2018-08-21 NOTE — Progress Notes (Signed)
Subjective:   Jenny Roberts is a 73 y.o. female who presents for Medicare Annual (Subsequent) preventive examination.  Review of Systems:  No ROS.  Medicare Wellness Virtual Visit.  Visual/audio telehealth visit, UTA vital signs.   See social history for additional risk factors.   Cardiac Risk Factors include: advanced age (>82mn, >>55women)     Objective:     Vitals: There were no vitals taken for this visit.  There is no height or weight on file to calculate BMI.  Advanced Directives 08/21/2018 02/16/2018 08/18/2017 08/16/2016 09/17/2015 07/10/2015  Does Patient Have a Medical Advance Directive? No No No No No No  Does patient want to make changes to medical advance directive? - - No - Patient declined - - -  Would patient like information on creating a medical advance directive? No - Patient declined - - Yes (MAU/Ambulatory/Procedural Areas - Information given) Yes - Educational materials given (No Data)    Tobacco Social History   Tobacco Use  Smoking Status Never Smoker  Smokeless Tobacco Never Used     Counseling given: Not Answered   Clinical Intake:  Pre-visit preparation completed: Yes        Diabetes: No  How often do you need to have someone help you when you read instructions, pamphlets, or other written materials from your doctor or pharmacy?: 1 - Never  Interpreter Needed?: No     Past Medical History:  Diagnosis Date  . Anxiety   [place]   Right. Treated with lumpectomy followed by Chemotherapy and Radiation therapy  . Depression   . Hyperlipidemia   . Nontraumatic rupture of tendon of thumb 06/15/2014  . [facility]2004   [facility] . Thyroid disease    Past Surgical History:  Procedure Laterality Date  . ABDOMINAL HYSTERECTOMY  1983   Left parts of each ovary   . BREAST BIOPSY Right 2001   positive  . BREAST EXCISIONAL BIOPSY Right 1990's   benign  . BREAST LUMPECTOMY Right 08/16/1999   Metaplastic  carcinoma, grade 3; 1.7 cm, T1c, N0; 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  Focal positive posterior margin.  0/5 lymph nodes.  . CARPAL TUNNEL RELEASE  2016  . COLONOSCOPY  2008  . COLONOSCOPY WITH PROPOFOL N/A 02/16/2018   Procedure: COLONOSCOPY WITH PROPOFOL;  Surgeon: AJonathon Bellows MD;  [facility];  Service: Endoscopy;  Laterality: N/A;  . KNEE SURGERY  2003  . mole excision  2006   Basal cell   . WRIST SURGERY Right 2010   Family History  Problem Relation Age of Onset  . Brain cancer Father   . Cancer Maternal Grandfather 74      Colon Cancer   Social History   Socioeconomic History  . Marital status: Married    Spouse name: Not on file  . Number of children: Not on file  . Years of education: Not on file  . Highest education level: Not on file  Occupational History  . Not on file  Social Needs  . Financial resource strain: Not hard at all  . Food insecurity    Worry: Never true    Inability: Never true  . Transportation needs    Medical: No    Non-medical: No  Tobacco Use  . Smoking status: Never Smoker  . Smokeless tobacco: Never Used  Substance and Sexual Activity  . Alcohol use: No    Alcohol/week: 0.0 standard drinks  .  Drug use: No  . Sexual activity: Not Currently  Lifestyle  . Physical activity    Days per week: 0 days    Minutes per session: Not on file  . Stress: Not at all  Relationships  . Social Herbalist on phone: Not on file    Gets together: Not on file    Attends religious service: Not on file    Active member of club or organization: Not on file    Attends meetings of clubs or organizations: Not on file    Relationship status: Not on file  Other Topics Concern  . Not on file  Social History Narrative   She is a homemaker and babysits   Children- 2 daughters    8 grandchildren, 3 great grandchildren    Pets: None   Caffeine- Decaf coffee, rare Dr. Malachi Bonds    Enjoys- gardening, cross stitch, and quilt     Outpatient Encounter Medications as of 08/21/2018  Medication Sig  . acetaminophen (TYLENOL) 500 MG tablet Take by mouth.  . benzonatate (TESSALON) 200 MG capsule Take 1 capsule (200 mg total) by mouth 2 (two) times daily as needed for cough.  . cholecalciferol (VITAMIN D) 1000 units tablet Take 1,000 Units by mouth daily.  . clonazePAM (KLONOPIN) 0.5 MG tablet TAKE 1 TABLET(0.5 MG) BY MOUTH DAILY  . famotidine (PEPCID) 20 MG tablet Take 20 mg by mouth 3 times/day as needed-between meals & bedtime for heartburn or indigestion.  . fluticasone (FLONASE) 50 MCG/ACT nasal spray Place 2 sprays into both nostrils daily. (Patient taking differently: Place 2 sprays into both nostrils daily as needed for allergies. )  . ibuprofen (ADVIL,MOTRIN) 200 MG tablet Take by mouth.  . levothyroxine (SYNTHROID) 100 MCG tablet Take 1 tablet (100 mcg total) by mouth daily.  Marland Kitchen lovastatin (MEVACOR) 40 MG tablet TAKE 1 TABLET BY MOUTH EVERY DAY  . naproxen (NAPROSYN) 500 MG tablet   . sertraline (ZOLOFT) 100 MG tablet Take 1 tablet (100 mg total) by mouth daily.   No facility-administered encounter medications on file as of 08/21/2018.     Activities of Daily Living In your present state of health, do you have any difficulty performing the following activities: 08/21/2018  Hearing? N  Vision? N  Difficulty concentrating or making decisions? N  Walking or climbing stairs? N  Dressing or bathing? N  Doing errands, shopping? N  Preparing Food and eating ? N  Using the Toilet? N  In the past six months, have you accidently leaked urine? N  Do you have problems with loss of bowel control? N  Managing your Medications? N  Managing your Finances? N  Housekeeping or managing your Housekeeping? N  Some recent data might be hidden    Patient Care Team: Leone Haven, MD as PCP - General (Family Medicine) Christene Lye, MD (General Surgery)    Assessment:   This is a routine wellness examination for  Jenny Roberts.  I connected with patient 08/21/18 at 1:30 by an audio enabled telemedicine application and verified that I am speaking with the correct person using two identifiers. Patient stated full name and DOB. Patient gave permission to continue with virtual visit. Patient's location was at home and Nurse's location was at [place] office.   Health Screenings  Mammogram - 10/2017 Colonoscopy - 01/2018 Cologuard- 11/2017 Bone Density - 03/2008 Glaucoma -none Hearing -demonstrates normal hearing during visit. Hepatitis C Screening- 07/2016 Hemoglobin A1C - 02/2017 (5.2) Cholesterol - 03/2018 Dental-  UTD Vision- visits within the last 12 months. Wears glasses.  Social  Alcohol intake - no        Smoking history- never   Smokers in home? none Illicit drug use? none Exercise - no routine. Encouraged to walk daily as tolerated.  Diet - regular Sexually Active -not currently BMI- discussed the importance of a healthy diet, water intake and the benefits of aerobic exercise.  Educational material provided.   Safety  Patient feels safe at home- yes Patient does have smoke detectors at home- yes Patient does wear sunscreen or protective clothing when in direct sunlight -yes Patient does wear seat belt when in a moving vehicle -yes Patient drives- yes  [id number] precautions and sickness symptoms discussed.   Activities of Daily Living Patient denies needing assistance with: driving, household chores, feeding themselves, getting from bed to chair, getting to the toilet, bathing/showering, dressing, managing money, or preparing meals.  No new identified risk were noted.    Depression Screen Patient denies losing interest in daily life, feeling hopeless, or crying easily over simple problems.   Medication-taking as directed and without issues.   Fall Screen Patient denies being afraid of falling or falling in the last year.   Memory Screen Patient is alert.  Patient denies difficulty  focusing, concentrating or misplacing items. Correctly identified the president of the [place], season and recall 3/3. Patient likes to read, cross stitch, quilt brain stimulation.  Immunizations The following Immunizations were discussed: Influenza, shingles, pneumonia, and tetanus.   Other Providers Patient Care Team: Leone Haven, MD as PCP - General (Family Medicine) Christene Lye, MD (General Surgery)  Exercise Activities and Dietary recommendations    Goals      Patient Stated   . DIET - INCREASE WATER INTAKE (pt-stated)     Weight less than 140lb       Fall Risk Fall Risk  08/21/2018 11/20/2017 08/18/2017 08/16/2016 07/10/2015  Falls in the past year? 0 No No No No  Number falls in past yr: 0 - - - -  Is the patient's home free of loose throw rugs in walkways, pet beds, electrical cords, etc? yes      Grab bars in the bathroom? yes      Handrails on the stairs? yes      Adequate lighting? yes  Depression Screen PHQ 2/9 Scores 08/21/2018 08/18/2017 08/16/2016 07/10/2015  PHQ - 2 Score 0 0 0 0     Cognitive Function MMSE - Mini Mental State Exam 08/18/2017 08/16/2016 07/10/2015  Orientation to time '5 5 5  ' Orientation to Place '5 5 5  ' Registration '3 3 3  ' Attention/ Calculation '5 5 5  ' Recall '3 3 3  ' Language- name 2 objects '2 2 2  ' Language- repeat '1 1 1  ' Language- follow 3 step command '3 3 3  ' Language- read & follow direction '1 1 1  ' Write a sentence '1 1 1  ' Copy design '1 1 1  ' Total score '30 30 30     ' 6CIT Screen 08/21/2018  What Year? 0 points  What month? 0 points  What time? 0 points  Count back from 20 0 points  Months in reverse 0 points  Repeat phrase 0 points  Total Score 0    Immunization History  Administered Date(s) Administered  . Influenza, High Dose Seasonal PF 10/06/2015, 02/28/2017, 12/26/2017  . Influenza-Unspecified 11/07/2014  . Pneumococcal Conjugate-13 07/10/2015  . Pneumococcal Polysaccharide-23 08/16/2016   Screening Tests  Health Maintenance  Topic Date Due  . TETANUS/TDAP  04/08/1964  . INFLUENZA VACCINE  08/22/2018  . MAMMOGRAM  11/04/2018  . COLONOSCOPY  02/17/2028  . DEXA SCAN  Completed  . Hepatitis C Screening  Completed  . PNA vac Low Risk Adult  Completed      Plan:   End of life planning; Advanced aging; Advanced directives discussed.  No HCPOA/Living Will.  Additional information declined at this time.  I have personally reviewed and noted the following in the patient's chart:   . Medical and social history . Use of alcohol, tobacco or illicit drugs  . Current medications and supplements . Functional ability and status . Nutritional status . Physical activity . Advanced directives . List of other physicians . Hospitalizations, surgeries, and ER visits in previous 12 months . Vitals . Screenings to include cognitive, depression, and falls . Referrals and appointments  In addition, I have reviewed and discussed with patient certain preventive protocols, quality metrics, and best practice recommendations. A written personalized care plan for preventive services as well as general preventive health recommendations were provided to patient.     Varney Biles, LPN  2/90/9030

## 2018-08-21 NOTE — Assessment & Plan Note (Signed)
Continue lovastatin 

## 2018-08-21 NOTE — Telephone Encounter (Signed)
Called patient twice at appointed time for awv. No answer. Unable to leave voice mail. She has an appointment with her pcp at 11:00. Please reschedule patient as appropriate.

## 2018-08-21 NOTE — Assessment & Plan Note (Signed)
Patient continues to want to defer colonoscopy relating to the COVID-19 pandemic.  I discussed the risk of missing a precancerous colon polyp or a colon cancer if she does not proceed with colonoscopy.  I encouraged her to proceed with colonoscopy due to this risk though she wants to defer it until later this year depending on what the COVID-19 pandemic does.

## 2018-08-23 NOTE — Progress Notes (Signed)
I have reviewed the above note and agree.  Ova Gillentine, M.D.  

## 2018-08-27 ENCOUNTER — Encounter: Payer: Self-pay | Admitting: Family Medicine

## 2018-09-02 ENCOUNTER — Other Ambulatory Visit: Payer: Self-pay | Admitting: Family Medicine

## 2018-09-21 ENCOUNTER — Other Ambulatory Visit: Payer: Self-pay | Admitting: Family Medicine

## 2018-09-22 ENCOUNTER — Encounter: Payer: Self-pay | Admitting: Family Medicine

## 2018-09-22 ENCOUNTER — Other Ambulatory Visit: Payer: Self-pay

## 2018-09-22 ENCOUNTER — Ambulatory Visit (INDEPENDENT_AMBULATORY_CARE_PROVIDER_SITE_OTHER): Payer: Medicare Other | Admitting: Family Medicine

## 2018-09-22 VITALS — BP 120/70 | HR 76 | Temp 99.0°F | Ht 62.0 in | Wt 142.6 lb

## 2018-09-22 DIAGNOSIS — E039 Hypothyroidism, unspecified: Secondary | ICD-10-CM

## 2018-09-22 DIAGNOSIS — R350 Frequency of micturition: Secondary | ICD-10-CM | POA: Diagnosis not present

## 2018-09-22 DIAGNOSIS — N3000 Acute cystitis without hematuria: Secondary | ICD-10-CM | POA: Diagnosis not present

## 2018-09-22 DIAGNOSIS — Z23 Encounter for immunization: Secondary | ICD-10-CM

## 2018-09-22 DIAGNOSIS — R8290 Unspecified abnormal findings in urine: Secondary | ICD-10-CM

## 2018-09-22 DIAGNOSIS — G250 Essential tremor: Secondary | ICD-10-CM | POA: Diagnosis not present

## 2018-09-22 LAB — POCT URINALYSIS DIPSTICK
Glucose, UA: POSITIVE — AB
Nitrite, UA: POSITIVE
Protein, UA: POSITIVE — AB
Spec Grav, UA: >=1.03 — AB (ref 1.010–1.025)
Urobilinogen, UA: >=8 E.U./dL — AB
pH, UA: 5 (ref 5.0–8.0)

## 2018-09-22 LAB — URINALYSIS, MICROSCOPIC ONLY

## 2018-09-22 LAB — TSH: TSH: 0.03 u[IU]/mL — ABNORMAL LOW (ref 0.35–4.50)

## 2018-09-22 MED ORDER — NITROFURANTOIN MONOHYD MACRO 100 MG PO CAPS: 100.0000 mg | ORAL_CAPSULE | Freq: Two times a day (BID) | ORAL | 0 refills | Status: AC

## 2018-09-22 NOTE — Progress Notes (Signed)
[name], MD Phone: [phone]  Jenny Roberts is a 73 y.o. female who presents today for same day visit.   UTI: started 2 days ago.  Patient took Azo this morning. Dysuria- yes  Frequency- yes   Urgency- yes   Hematuria- no   Fever- no  Abd pain- no   Vaginal d/c- no  Benign essential tremor: long history of this. Typically only bothers her with eating and writing though can be worse with anxiety.  Does not interfere with her life to a significant degree.  No prior medications. She reports being evaluated by a neurologist many years ago for this and diagnosed with essential tremor. Strong family history of this. No EtOH intake.   Hypothyroidism: Patient is due for repeat TSH.  Prior TSH was low and her Synthroid dose was altered appropriately.  She will have this completed today.  Social History   Tobacco Use  Smoking Status Never Smoker  Smokeless Tobacco Never Used     ROS see history of present illness  Objective  Physical Exam Vitals:   09/22/18 1105  BP: 120/70  Pulse: 76  Temp: 99 F (37.2 C)  SpO2: 93%    BP Readings from Last 3 Encounters:  09/22/18 120/70  04/22/18 (!) 148/80  03/30/18 (!) 140/96   Wt Readings from Last 3 Encounters:  09/22/18 142 lb 9.6 oz (64.7 kg)  08/21/18 142 lb (64.4 kg)  03/30/18 147 lb (66.7 kg)    Physical Exam Constitutional:      General: She is not in acute distress.    Appearance: She is not diaphoretic.  Cardiovascular:     Rate and Rhythm: Normal rate and regular rhythm.     Heart sounds: Normal heart sounds.  Pulmonary:     Effort: Pulmonary effort is normal.     Breath sounds: Normal breath sounds.  Abdominal:     General: Bowel sounds are normal. There is no distension.     Palpations: Abdomen is soft.     Tenderness: There is no abdominal tenderness. There is no guarding or rebound.  Skin:    General: Skin is warm and dry.  Neurological:     Mental Status: She is alert.     Comments: Mild  intention tremor noted bilateral hands      Assessment/Plan: Please see individual problem list.  UTI (urinary tract infection) Symptoms concerning for UTI.  Patient took Azo and that is likely why her UA is pan-positive.  We will treat with Macrobid.  We will send urine for culture microscopy.  She will return in 1 month for repeat urine check.  Given return precautions.  Benign essential tremor Patient with a long history of benign essential tremor.  Not interfering with her life.  She will monitor.  Discussed that we could consider medication if there is any worsening.  Hypothyroidism TSH to be completed today.    Orders Placed This Encounter  Procedures  . Urine Culture  . Flu vaccine HIGH DOSE PF (Fluzone High dose)  . Urine Microscopic Only  . POCT Urinalysis Dipstick  . POCT urinalysis dipstick    Standing Status:   Future    Standing Expiration Date:   11/22/2018    Meds ordered this encounter  Medications  . nitrofurantoin, macrocrystal-monohydrate, (MACROBID) 100 MG capsule    Sig: Take 1 capsule (100 mg total) by mouth 2 (two) times daily for 5 days.    Dispense:  10 capsule    Refill:  0  [name], MD [place]

## 2018-09-22 NOTE — Patient Instructions (Signed)
Nice to see you.  We will treat you for a UTI. If you develop blood in your urine, fevers, or abdominal pain please be looked at again.  Please monitor your tremor and let us know if it worsens.

## 2018-09-22 NOTE — Assessment & Plan Note (Signed)
TSH to be completed today.

## 2018-09-22 NOTE — Assessment & Plan Note (Signed)
Patient with a long history of benign essential tremor.  Not interfering with her life.  She will monitor.  Discussed that we could consider medication if there is any worsening.

## 2018-09-22 NOTE — Assessment & Plan Note (Signed)
Symptoms concerning for UTI.  Patient took Azo and that is likely why her UA is pan-positive.  We will treat with Macrobid.  We will send urine for culture microscopy.  She will return in 1 month for repeat urine check.  Given return precautions.

## 2018-09-24 ENCOUNTER — Other Ambulatory Visit: Payer: Self-pay | Admitting: Family Medicine

## 2018-09-24 DIAGNOSIS — E039 Hypothyroidism, unspecified: Secondary | ICD-10-CM

## 2018-09-24 LAB — URINE CULTURE
MICRO NUMBER:: 834431
SPECIMEN QUALITY:: ADEQUATE

## 2018-09-24 MED ORDER — LEVOTHYROXINE SODIUM 88 MCG PO TABS: 88.0000 ug | ORAL_TABLET | Freq: Every day | ORAL | 1 refills | Status: DC

## 2018-09-29 ENCOUNTER — Encounter: Payer: Self-pay | Admitting: *Deleted

## 2018-09-29 ENCOUNTER — Other Ambulatory Visit: Payer: No Typology Code available for payment source

## 2018-10-27 ENCOUNTER — Other Ambulatory Visit (INDEPENDENT_AMBULATORY_CARE_PROVIDER_SITE_OTHER): Payer: Medicare Other

## 2018-10-27 ENCOUNTER — Other Ambulatory Visit: Payer: Self-pay

## 2018-10-27 DIAGNOSIS — R8290 Unspecified abnormal findings in urine: Secondary | ICD-10-CM | POA: Diagnosis not present

## 2018-10-27 DIAGNOSIS — E039 Hypothyroidism, unspecified: Secondary | ICD-10-CM

## 2018-10-27 LAB — POCT URINALYSIS DIPSTICK
Bilirubin, UA: NEGATIVE
Blood, UA: NEGATIVE
Glucose, UA: NEGATIVE
Ketones, UA: NEGATIVE
Nitrite, UA: NEGATIVE
Protein, UA: NEGATIVE
Spec Grav, UA: >=1.03 — AB (ref 1.010–1.025)
Urobilinogen, UA: 0.2 E.U./dL
pH, UA: 6 (ref 5.0–8.0)

## 2018-10-27 LAB — TSH: TSH: 0.08 u[IU]/mL — ABNORMAL LOW (ref 0.35–4.50)

## 2018-10-29 ENCOUNTER — Other Ambulatory Visit: Payer: Self-pay | Admitting: Family Medicine

## 2018-10-29 DIAGNOSIS — E039 Hypothyroidism, unspecified: Secondary | ICD-10-CM

## 2018-10-29 MED ORDER — LEVOTHYROXINE SODIUM 75 MCG PO TABS: 75.0000 ug | ORAL_TABLET | Freq: Every day | ORAL | 1 refills | Status: DC

## 2018-11-02 ENCOUNTER — Other Ambulatory Visit: Payer: Self-pay | Admitting: Family Medicine

## 2018-11-05 ENCOUNTER — Ambulatory Visit
Admission: RE | Admit: 2018-11-05 | Discharge: 2018-11-05 | Disposition: A | Discharge status: Home / Self Care | Payer: Medicare Other | Source: Ambulatory Visit | Attending: Family Medicine | Admitting: Family Medicine

## 2018-11-05 DIAGNOSIS — Z1231 Encounter for screening mammogram for malignant neoplasm of breast: Secondary | ICD-10-CM | POA: Diagnosis not present

## 2018-11-05 HISTORY — DX: Personal history of antineoplastic chemotherapy: Z92.21

## 2018-11-05 HISTORY — DX: Personal history of irradiation: Z92.3

## 2018-11-13 ENCOUNTER — Ambulatory Visit: Payer: Medicare Other | Admitting: Surgery

## 2018-11-18 ENCOUNTER — Other Ambulatory Visit: Payer: Self-pay | Admitting: Family Medicine

## 2018-12-08 ENCOUNTER — Other Ambulatory Visit: Payer: Self-pay

## 2018-12-10 ENCOUNTER — Other Ambulatory Visit (INDEPENDENT_AMBULATORY_CARE_PROVIDER_SITE_OTHER): Payer: Medicare Other

## 2018-12-10 ENCOUNTER — Other Ambulatory Visit: Payer: Self-pay

## 2018-12-10 DIAGNOSIS — E039 Hypothyroidism, unspecified: Secondary | ICD-10-CM

## 2018-12-10 LAB — TSH: TSH: 0.35 u[IU]/mL (ref 0.35–4.50)

## 2018-12-25 ENCOUNTER — Ambulatory Visit: Payer: No Typology Code available for payment source | Admitting: Family Medicine

## 2019-01-04 ENCOUNTER — Telehealth: Payer: Self-pay

## 2019-01-04 MED ORDER — CLONAZEPAM 0.5 MG PO TABS: ORAL_TABLET | ORAL | 1 refills | Status: DC

## 2019-01-04 NOTE — Telephone Encounter (Signed)
Sent to pharmacy 

## 2019-02-16 ENCOUNTER — Other Ambulatory Visit: Payer: Self-pay | Admitting: Family Medicine

## 2019-03-04 ENCOUNTER — Other Ambulatory Visit: Payer: Self-pay | Admitting: Family Medicine

## 2019-03-24 ENCOUNTER — Other Ambulatory Visit: Payer: Self-pay | Admitting: Family Medicine

## 2019-04-04 ENCOUNTER — Other Ambulatory Visit: Payer: Self-pay | Admitting: Family Medicine

## 2019-04-07 NOTE — Telephone Encounter (Signed)
Patient has not been seen in >6 months. She needs to schedule an appointment for follow-up and then I can refill to cover until she follows up. Please call her to get her scheduled.

## 2019-04-08 NOTE — Telephone Encounter (Signed)
Pt scheduled for next available appt on

## 2019-04-08 NOTE — Telephone Encounter (Signed)
Pt scheduled for next available appt on 05/18/2019. Please send refill of clonazePAM . Pt is completely out.

## 2019-05-05 ENCOUNTER — Other Ambulatory Visit: Payer: Self-pay | Admitting: Family Medicine

## 2019-05-05 DIAGNOSIS — E039 Hypothyroidism, unspecified: Secondary | ICD-10-CM

## 2019-05-18 ENCOUNTER — Ambulatory Visit (INDEPENDENT_AMBULATORY_CARE_PROVIDER_SITE_OTHER): Payer: Medicare Other | Admitting: Family Medicine

## 2019-05-18 ENCOUNTER — Other Ambulatory Visit: Payer: Self-pay

## 2019-05-18 ENCOUNTER — Encounter: Payer: Self-pay | Admitting: Family Medicine

## 2019-05-18 DIAGNOSIS — F411 Generalized anxiety disorder: Secondary | ICD-10-CM

## 2019-05-18 DIAGNOSIS — R195 Other fecal abnormalities: Secondary | ICD-10-CM

## 2019-05-18 DIAGNOSIS — E785 Hyperlipidemia, unspecified: Secondary | ICD-10-CM | POA: Diagnosis not present

## 2019-05-18 DIAGNOSIS — E039 Hypothyroidism, unspecified: Secondary | ICD-10-CM

## 2019-05-18 LAB — COMPREHENSIVE METABOLIC PANEL
ALT: 22 U/L (ref 0–35)
AST: 24 U/L (ref 0–37)
Albumin: 4.4 g/dL (ref 3.5–5.2)
Alkaline Phosphatase: 96 U/L (ref 39–117)
BUN: 21 mg/dL (ref 6–23)
CO2: 27 mEq/L (ref 19–32)
Calcium: 9.6 mg/dL (ref 8.4–10.5)
Chloride: 105 mEq/L (ref 96–112)
Creatinine, Ser: 0.79 mg/dL (ref 0.40–1.20)
GFR: 71.12 mL/min (ref >60.00)
Glucose, Bld: 110 mg/dL — ABNORMAL HIGH (ref 70–99)
Potassium: 4.2 mEq/L (ref 3.5–5.1)
Sodium: 139 mEq/L (ref 135–145)
Total Bilirubin: 0.3 mg/dL (ref 0.2–1.2)
Total Protein: 6.7 g/dL (ref 6.0–8.3)

## 2019-05-18 LAB — LIPID PANEL
Cholesterol: 155 mg/dL (ref 0–200)
HDL: 50.4 mg/dL (ref >39.00)
LDL Cholesterol: 90 mg/dL (ref 0–99)
NonHDL: 104.61
Total CHOL/HDL Ratio: 3
Triglycerides: 72 mg/dL (ref 0.0–149.0)
VLDL: 14.4 mg/dL (ref 0.0–40.0)

## 2019-05-18 LAB — TSH: TSH: 1.76 u[IU]/mL (ref 0.35–4.50)

## 2019-05-18 MED ORDER — LOVASTATIN 40 MG PO TABS: 40.0000 mg | ORAL_TABLET | Freq: Every day | ORAL | 3 refills | Status: DC

## 2019-05-18 NOTE — Progress Notes (Signed)
[name], MD Phone: [phone]  Jenny Roberts is a 74 y.o. female who presents today for f/u.  HYPOTHYROIDISM Disease Monitoring Weight changes: no  Skin Changes: no Heat/Cold intolerance: no  Medication Monitoring Compliance:  Taking synthroid   Last TSH:   Lab Results  Component Value Date   TSH 0.35 12/10/2018   HYPERLIPIDEMIA Symptoms Chest pain on exertion:  no   Leg claudication:   no Medications: Compliance- taking lovastatin Right upper quadrant pain- no  Muscle aches- no  Anxiety/sleep difficulty: Patient typically takes clonazepam to help her sleep.  She notes no anxiety or depression.  She has been on this for many years.  No drowsiness with it.  No alcohol intake.  She gets 7 hours of sleep.     Social History   Tobacco Use  Smoking Status Never Smoker  Smokeless Tobacco Never Used     ROS see history of present illness  Objective  Physical Exam Vitals:   05/18/19 1212  BP: 125/80  Pulse: 71  Temp: (!) 97.4 F (36.3 C)  SpO2: 96%    BP Readings from Last 3 Encounters:  05/18/19 125/80  09/22/18 120/70  04/22/18 (!) 148/80   Wt Readings from Last 3 Encounters:  05/18/19 148 lb 9.6 oz (67.4 kg)  09/22/18 142 lb 9.6 oz (64.7 kg)  08/21/18 142 lb (64.4 kg)    Physical Exam Constitutional:      General: She is not in acute distress.    Appearance: She is not diaphoretic.  Cardiovascular:     Rate and Rhythm: Normal rate and regular rhythm.     Heart sounds: Normal heart sounds.  Pulmonary:     Effort: Pulmonary effort is normal.     Breath sounds: Normal breath sounds.  Skin:    General: Skin is warm and dry.  Neurological:     Mental Status: She is alert.      Assessment/Plan: Please see individual problem list.  Hypothyroidism Check TSH.  Continue Synthroid.  Hyperlipidemia Check lipid panel.  Check CMP.  Continue lovastatin.  Anxiety state Asymptomatic.  Taking clonazepam to help her sleep.  She can continue  her current regimen.  Positive colorectal cancer screening using Cologuard test I had another long conversation with the patient regarding the need for colonoscopy.  Discussed that we could be missing a colon polyp that can turn into cancer or a colon cancer without doing the colonoscopy.  She notes her prior experience with having the colonoscopy and the prep was not great and she is hesitant to undergo that again.  She noted that she has not noticed any abdominal or bowel changes.  I discussed that a significant percentage of people that have polyps or colon cancer do not have any symptoms.  I discussed that if she were to develop a colon cancer and had to have a colectomy and/or chemotherapy and radiation she would likely feel much worse than she did when she had a colonoscopy.  She continued to decline having this completed.  I advised her to let us know when she is ready to have this done.  She understands the risks of not having this completed.    Orders Placed This Encounter  Procedures  . TSH  . Comp Met (CMET)  . Lipid panel    Meds ordered this encounter  Medications  . lovastatin (MEVACOR) 40 MG tablet    Sig: Take 1 tablet (40 mg total) by mouth daily.    Dispense:  90 tablet    Refill:  3    This visit occurred during the SARS-CoV-2 public health emergency.  Safety protocols were in place, including screening questions prior to the visit, additional usage of staff PPE, and extensive cleaning of exam room while observing appropriate contact time as indicated for disinfecting solutions.    [name], MD [place]

## 2019-05-18 NOTE — Assessment & Plan Note (Signed)
Check lipid panel.  Check CMP.  Continue lovastatin.

## 2019-05-18 NOTE — Assessment & Plan Note (Signed)
Check TSH.  Continue Synthroid. 

## 2019-05-18 NOTE — Patient Instructions (Signed)
Nice to see you. Please consider having your colonoscopy completed. We will get lab work.

## 2019-05-18 NOTE — Assessment & Plan Note (Signed)
I had another long conversation with the patient regarding the need for colonoscopy.  Discussed that we could be missing a colon polyp that can turn into cancer or a colon cancer without doing the colonoscopy.  She notes her prior experience with having the colonoscopy and the prep was not great and she is hesitant to undergo that again.  She noted that she has not noticed any abdominal or bowel changes.  I discussed that a significant percentage of people that have polyps or colon cancer do not have any symptoms.  I discussed that if she were to develop a colon cancer and had to have a colectomy and/or chemotherapy and radiation she would likely feel much worse than she did when she had a colonoscopy.  She continued to decline having this completed.  I advised her to let us know when she is ready to have this done.  She understands the risks of not having this completed.

## 2019-05-18 NOTE — Assessment & Plan Note (Signed)
Asymptomatic.  Taking clonazepam to help her sleep.  She can continue her current regimen.

## 2019-07-07 ENCOUNTER — Other Ambulatory Visit: Payer: Self-pay | Admitting: Family Medicine

## 2019-07-08 NOTE — Telephone Encounter (Signed)
Refill request for klonopin, last seen 05-18-19, last filled 05-06-19.  Please advise.

## 2019-08-24 ENCOUNTER — Ambulatory Visit (INDEPENDENT_AMBULATORY_CARE_PROVIDER_SITE_OTHER): Payer: Medicare Other

## 2019-08-24 ENCOUNTER — Encounter (INDEPENDENT_AMBULATORY_CARE_PROVIDER_SITE_OTHER): Payer: Self-pay

## 2019-08-24 VITALS — BP 128/72 | Ht 62.0 in | Wt 148.0 lb

## 2019-08-24 DIAGNOSIS — Z0000 Encounter for general adult medical examination without abnormal findings: Secondary | ICD-10-CM

## 2019-08-24 NOTE — Progress Notes (Signed)
Subjective:   Jenny Roberts is a 74 y.o. female who presents for Medicare Annual (Subsequent) preventive examination.  Review of Systems    No ROS.  Medicare Wellness Virtual Visit.  Cardiac Risk Factors include: advanced age (>66mn, >>95women)     Objective:    Today's Vitals   08/24/19 1033  BP: 128/72  Weight: 148 lb (67.1 kg)  Height: _0  (1.575 m)   Body mass index is 27.07 kg/m.  Advanced Directives 08/24/2019 08/21/2018 02/16/2018 08/18/2017 08/16/2016 09/17/2015 07/10/2015  Does Patient Have a Medical Advance Directive? _1  No No  Does patient want to make changes to medical advance directive? - - - No - Patient declined - - -  Would patient like information on creating a medical advance directive? No - Patient declined No - Patient declined - - Yes (MAU/Ambulatory/Procedural Areas - Information given) Yes - Educational materials given (No Data)    Current Medications (verified) Outpatient Encounter Medications as of 08/24/2019  Medication Sig  . acetaminophen (TYLENOL) 500 MG tablet Take by mouth.  . benzonatate (TESSALON) 200 MG capsule Take 1 capsule (200 mg total) by mouth 2 (two) times daily as needed for cough.  . cholecalciferol (VITAMIN D) 1000 units tablet Take 1,000 Units by mouth daily.  . clonazePAM (KLONOPIN) 0.5 MG tablet TAKE 1 TABLET(0.5 MG) BY MOUTH DAILY.  . famotidine (PEPCID) 20 MG tablet Take 20 mg by mouth 3 times/day as needed-between meals & bedtime for heartburn or indigestion.  . fluticasone (FLONASE) 50 MCG/ACT nasal spray Place 2 sprays into both nostrils daily. (Patient taking differently: Place 2 sprays into both nostrils daily as needed for allergies. )  . ibuprofen (ADVIL,MOTRIN) 200 MG tablet Take by mouth.  . levothyroxine (SYNTHROID) 75 MCG tablet TAKE 1 TABLET(75 MCG) BY MOUTH DAILY  . lovastatin (MEVACOR) 40 MG tablet Take 1 tablet (40 mg total) by mouth daily.  . naproxen (NAPROSYN) 500 MG tablet   . sertraline (ZOLOFT) 100  MG tablet TAKE 1 TABLET(100 MG) BY MOUTH DAILY   No facility-administered encounter medications on file as of 08/24/2019.    Allergies (verified) Amoxicillin-pot clavulanate, Codeine, and Latex   History: Past Medical History:  Diagnosis Date  . Anxiety   . [place] cancer [place]   Right. Treated with lumpectomy followed by Chemotherapy and Radiation therapy  . Depression   . Hyperlipidemia   . Nontraumatic rupture of tendon of thumb 06/15/2014  . Personal history of chemotherapy   . Personal history of radiation therapy   . [facility]   [facility] . Thyroid disease    Past Surgical History:  Procedure Laterality Date  . ABDOMINAL HYSTERECTOMY  1983   Left parts of each ovary   . BREAST BIOPSY Right 2001   positive  . BREAST EXCISIONAL BIOPSY Right 1990's   benign  . 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  Focal positive posterior margin.  0/5 lymph nodes.  . CARPAL TUNNEL RELEASE  2016  . COLONOSCOPY  2008  . COLONOSCOPY WITH PROPOFOL N/A 02/16/2018   Procedure: COLONOSCOPY WITH PROPOFOL;  Surgeon: AJonathon Bellows MD;  [facility];  Service: Endoscopy;  Laterality: N/A;  . KNEE SURGERY  2003  . mole excision  2006   Basal cell   . WRIST SURGERY Right 2010   Family History  Problem Relation Age of Onset  .  Brain cancer Father   . Cancer Maternal Grandfather 59       Colon Cancer  . Breast cancer Neg Hx    Social History   Socioeconomic History  . Marital status: Married    Spouse name: Not on file  . Number of children: Not on file  . Years of education: Not on file  . Highest education level: Not on file  Occupational History  . Not on file  Tobacco Use  . Smoking status: Never Smoker  . Smokeless tobacco: Never Used  Vaping Use  . Vaping Use: Never used  Substance and Sexual Activity  . Alcohol use: No    Alcohol/week: 0.0 standard drinks    . Drug use: No  . Sexual activity: Not Currently  Other Topics Concern  . Not on file  Social History Narrative   She is a homemaker and babysits   Children- 2 daughters    8 grandchildren, 3 great grandchildren    Pets: None   Caffeine- Decaf coffee, rare Dr. Malachi Bonds    Enjoys- gardening, cross stitch, and quilt   Social Determinants of Health   Financial Resource Strain: Low Risk   . Difficulty of Paying Living Expenses: Not hard at all  Food Insecurity: No Food Insecurity  . Worried About Charity fundraiser in the Last Year: Never true  . Ran Out of Food in the Last Year: Never true  Transportation Needs: No Transportation Needs  . Lack of Transportation (Medical): No  . Lack of Transportation (Non-Medical): No  Physical Activity:   . Days of Exercise per Week:   . Minutes of Exercise per Session:   Stress: No Stress Concern Present  . Feeling of Stress : Not at all  Social Connections: Unknown  . Frequency of Communication with Friends and Family: More than three times a week  . Frequency of Social Gatherings with Friends and Family: More than three times a week  . Attends Religious Services: Not on file  . Active Member of Clubs or Organizations: Not on file  . Attends Archivist Meetings: Not on file  . Marital Status: Married    Tobacco Counseling Counseling given: Not Answered   Clinical Intake:  Pre-visit preparation completed: Yes        Diabetes: No  How often do you need to have someone help you when you read instructions, pamphlets, or other written materials from your doctor or pharmacy?: 1 - Never  Interpreter Needed?: No      Activities of Daily Living In your present state of health, do you have any difficulty performing the following activities: 08/24/2019  Hearing? N  Vision? N  Difficulty concentrating or making decisions? N  Walking or climbing stairs? N  Dressing or bathing? N  Doing errands, shopping? N  Preparing Food  and eating ? N  Using the Toilet? N  In the past six months, have you accidently leaked urine? N  Do you have problems with loss of bowel control? N  Managing your Medications? N  Managing your Finances? N  Housekeeping or managing your Housekeeping? N  Some recent data might be hidden    Patient Care Team: Leone Haven, MD as PCP - General (Family Medicine) Christene Lye, MD (General Surgery)  Indicate any recent Medical Services you may have received from other than [facility] providers in the past year (date may be approximate).     Assessment:   This is a routine wellness examination  for Baring.  I connected with Chey today by telephone and verified that I am speaking with the correct person using two identifiers. Location patient: home Location provider: work Persons participating in the virtual visit: patient, Marine scientist.    I discussed the limitations, risks, security and privacy concerns of performing an evaluation and management service by telephone and the availability of in person appointments. The patient expressed understanding and verbally consented to this telephonic visit.    Interactive audio and video telecommunications were attempted between this provider and patient, however failed, due to patient having technical difficulties OR patient did not have access to video capability.  We continued and completed visit with audio only.  Some vital signs may be absent or patient reported.   Hearing/Vision screen  Hearing Screening   _0  _1  _2  _3  _4  _5  _6  _7  _8   Right ear:           Left ear:           Comments: Patient is able to hear conversational tones without difficulty.  No issues reported.   Vision Screening Comments: Followed by Dr. Gloriann Loan Wears corrective lenses Visual acuity not assessed, virtual visit.  They have seen their ophthalmologist in the last 12 months.     Dietary issues and exercise activities  discussed: Current Exercise Habits: Home exercise routine, Type of exercise: walking, Time (Minutes): 30, Frequency (Times/Week): 5, Weekly Exercise (Minutes/Week): 150, Intensity: Mild  Healthy diet Good water intake  Goals      Patient Stated   .  DIET - INCREASE WATER INTAKE (pt-stated)      Weight less than 140lb      Depression Screen PHQ 2/9 Scores 08/24/2019 05/18/2019 09/22/2018 08/21/2018 08/18/2017 08/16/2016 07/10/2015  PHQ - 2 Score 0 0 0 0 0 0 0    Fall Risk Fall Risk  08/24/2019 05/18/2019 09/22/2018 08/21/2018 11/20/2017  Falls in the past year? 0 0 0 0 No  Number falls in past yr: 0 0 0 0 -  Injury with Fall? - - 0 - -  Follow up Falls evaluation completed Falls evaluation completed Falls evaluation completed - -   Handrails in use when climbing stairs? Yes  Home free of loose throw rugs in walkways, pet beds, electrical cords, etc? Yes  Adequate lighting in your home to reduce risk of falls? Yes   ASSISTIVE DEVICES UTILIZED TO PREVENT FALLS:  Life alert? No  Use of a cane, walker or w/c? No  Grab bars in the bathroom? Yes  Shower chair or bench in shower? No  Elevated toilet seat or a handicapped toilet? No   TIMED UP AND GO:  Was the test performed? No . Virtual visit.   Cognitive Function: MMSE - Mini Mental State Exam 08/18/2017 08/16/2016 07/10/2015  Orientation to time _9 Orientation to Place _10 Registration _11 Attention/ Calculation _12 Recall _13 Language- name 2 objects _14 Language- repeat _15 Language- follow 3 step command _16 Language- read & follow direction _17 Write a sentence _18 Copy design _19 Total score _20 6CIT Screen 08/21/2018  What Year? 0 points  What month? 0 points  What time? 0 points  Count back from 20 0 points  Months in reverse 0 points  Repeat phrase 0 points  Total Score  0    Immunizations Immunization History  Administered Date(s) Administered  . Fluad Quad(high Dose 65+)  09/22/2018  . Influenza, High Dose Seasonal PF 10/06/2015, 02/28/2017, 12/26/2017  . Influenza-Unspecified 11/07/2014  . PFIZER SARS-COV-2 Vaccination 02/27/2019, 03/20/2019  . Pneumococcal Conjugate-13 07/10/2015  . Pneumococcal Polysaccharide-23 08/16/2016   TDAP status: Due, Education has been provided regarding the importance of this vaccine. Advised may receive this vaccine at local pharmacy or Health Dept. Aware to provide a copy of the vaccination record if obtained from local pharmacy or Health Dept. Verbalized acceptance and understanding. Deferred.    Health Maintenance Health Maintenance  Topic Date Due  . TETANUS/TDAP  Never done  . INFLUENZA VACCINE  08/22/2019  . MAMMOGRAM  11/05/2019  . COLONOSCOPY  02/17/2028  . DEXA SCAN  Completed  . COVID-19 Vaccine  Completed  . Hepatitis C Screening  Completed  . PNA vac Low Risk Adult  Completed    Dental Screening: Recommended annual dental exams for proper oral hygiene  Community Resource Referral / Chronic Care Management: CRR required this visit?  No   CCM required this visit?  No      Plan:   Keep all routine maintenance appointments.   Follow up 11/23/19 @ 9:00  I have personally reviewed and noted the following in the patient's chart:   . Medical and social history . Use of alcohol, tobacco or illicit drugs  . Current medications and supplements . Functional ability and status . Nutritional status . Physical activity . Advanced directives . List of other physicians . Hospitalizations, surgeries, and ER visits in previous 12 months . Vitals . Screenings to include cognitive, depression, and falls . Referrals and appointments  In addition, I have reviewed and discussed with patient certain preventive protocols, quality metrics, and best practice recommendations. A written personalized care plan for preventive services as well as general preventive health recommendations were provided to patient via mail.      Varney Biles, LPN   06/25/9933

## 2019-08-24 NOTE — Patient Instructions (Addendum)
Ms. Riede , Thank you for taking time to come for your Medicare Wellness Visit. I appreciate your ongoing commitment to your health goals. Please review the following plan we discussed and let me know if I can assist you in the future.   These are the goals we discussed: Goals      Patient Stated   .  DIET - INCREASE WATER INTAKE (pt-stated)      Weight less than 140lb       This is a list of the screening recommended for you and due dates:  Health Maintenance  Topic Date Due  . Tetanus Vaccine  Never done  . Flu Shot  08/22/2019  . Mammogram  11/05/2019  . Colon Cancer Screening  02/17/2028  . DEXA scan (bone density measurement)  Completed  . COVID-19 Vaccine  Completed  .  Hepatitis C: One time screening is recommended by Center for Disease Control  (CDC) for  adults born from [birth year] through 1965.   Completed  . Pneumonia vaccines  Completed    Immunizations Immunization History  Administered Date(s) Administered  . Fluad Quad(high Dose 65+) 09/22/2018  . Influenza, High Dose Seasonal PF 10/06/2015, 02/28/2017, 12/26/2017  . Influenza-Unspecified 11/07/2014  . PFIZER SARS-COV-2 Vaccination 02/27/2019, 03/20/2019  . Pneumococcal Conjugate-13 07/10/2015  . Pneumococcal Polysaccharide-23 08/16/2016   Keep all routine maintenance appointments.   Follow up 11/23/19 @ 9:00  Advanced directives: None. Plans to complete later in the season.   Conditions/risks identified: none new.   Follow up in one year for your annual wellness visit.   Preventive Care 47 Years and Older, Female Preventive care refers to lifestyle choices and visits with your health care provider that can promote health and wellness. What does preventive care include?  A yearly physical exam. This is also called an annual well check.  Dental exams once or twice a year.  Routine eye exams. Ask your health care provider how often you should have your eyes checked.  Personal lifestyle choices,  including:  Daily care of your teeth and gums.  Regular physical activity.  Eating a healthy diet.  Avoiding tobacco and drug use.  Limiting alcohol use.  Practicing safe sex.  Taking low-dose aspirin every day.  Taking vitamin and mineral supplements as recommended by your health care provider. What happens during an annual well check? The services and screenings done by your health care provider during your annual well check will depend on your age, overall health, lifestyle risk factors, and family history of disease. Counseling  Your health care provider may ask you questions about your:  Alcohol use.  Tobacco use.  Drug use.  Emotional well-being.  Home and relationship well-being.  Sexual activity.  Eating habits.  History of falls.  Memory and ability to understand (cognition).  Work and work Statistician.  Reproductive health. Screening  You may have the following tests or measurements:  Height, weight, and BMI.  Blood pressure.  Lipid and cholesterol levels. These may be checked every 5 years, or more frequently if you are over 39 years old.  Skin check.  Lung cancer screening. You may have this screening every year starting at age 54 if you have a 30-pack-year history of smoking and currently smoke or have quit within the past 15 years.  Fecal occult blood test (FOBT) of the stool. You may have this test every year starting at age 46.  Flexible sigmoidoscopy or colonoscopy. You may have a sigmoidoscopy every 5 years or a colonoscopy  every 10 years starting at age 86.  Hepatitis C blood test.  Hepatitis B blood test.  Sexually transmitted disease (STD) testing.  Diabetes screening. This is done by checking your blood sugar (glucose) after you have not eaten for a while (fasting). You may have this done every 1-3 years.  Bone density scan. This is done to screen for osteoporosis. You may have this done starting at age 40.  Mammogram. This  may be done every 1-2 years. Talk to your health care provider about how often you should have regular mammograms. Talk with your health care provider about your test results, treatment options, and if necessary, the need for more tests. Vaccines  Your health care provider may recommend certain vaccines, such as:  Influenza vaccine. This is recommended every year.  Tetanus, diphtheria, and acellular pertussis (Tdap, Td) vaccine. You may need a Td booster every 10 years.  Zoster vaccine. You may need this after age 61.  Pneumococcal 13-valent conjugate (PCV13) vaccine. One dose is recommended after age 80.  Pneumococcal polysaccharide (PPSV23) vaccine. One dose is recommended after age 55. Talk to your health care provider about which screenings and vaccines you need and how often you need them. This information is not intended to replace advice given to you by your health care provider. Make sure you discuss any questions you have with your health care provider. Document Released: 02/03/2015 Document Revised: 09/27/2015 Document Reviewed: 11/08/2014 Elsevier Interactive Patient Education  2017 [employer] Prevention in the Home Falls can cause injuries. They can happen to people of all ages. There are many things you can do to make your home safe and to help prevent falls. What can I do on the outside of my home?  Regularly fix the edges of walkways and driveways and fix any cracks.  Remove anything that might make you trip as you walk through a door, such as a raised step or threshold.  Trim any bushes or trees on the path to your home.  Use bright outdoor lighting.  Clear any walking paths of anything that might make someone trip, such as rocks or tools.  Regularly check to see if handrails are loose or broken. Make sure that both sides of any steps have handrails.  Any raised decks and porches should have guardrails on the edges.  Have any leaves, snow, or ice cleared  regularly.  Use sand or salt on walking paths during winter.  Clean up any spills in your garage right away. This includes oil or grease spills. What can I do in the bathroom?  Use night lights.  Install grab bars by the toilet and in the tub and shower. Do not use towel bars as grab bars.  Use non-skid mats or decals in the tub or shower.  If you need to sit down in the shower, use a plastic, non-slip stool.  Keep the floor dry. Clean up any water that spills on the floor as soon as it happens.  Remove soap buildup in the tub or shower regularly.  Attach bath mats securely with double-sided non-slip rug tape.  Do not have throw rugs and other things on the floor that can make you trip. What can I do in the bedroom?  Use night lights.  Make sure that you have a light by your bed that is easy to reach.  Do not use any sheets or blankets that are too big for your bed. They should not hang down onto the floor.  Have a firm chair that has side arms. You can use this for support while you get dressed.  Do not have throw rugs and other things on the floor that can make you trip. What can I do in the kitchen?  Clean up any spills right away.  Avoid walking on wet floors.  Keep items that you use a lot in easy-to-reach places.  If you need to reach something above you, use a strong step stool that has a grab bar.  Keep electrical cords out of the way.  Do not use floor polish or wax that makes floors slippery. If you must use wax, use non-skid floor wax.  Do not have throw rugs and other things on the floor that can make you trip. What can I do with my stairs?  Do not leave any items on the stairs.  Make sure that there are handrails on both sides of the stairs and use them. Fix handrails that are broken or loose. Make sure that handrails are as long as the stairways.  Check any carpeting to make sure that it is firmly attached to the stairs. Fix any carpet that is loose  or worn.  Avoid having throw rugs at the top or bottom of the stairs. If you do have throw rugs, attach them to the floor with carpet tape.  Make sure that you have a light switch at the top of the stairs and the bottom of the stairs. If you do not have them, ask someone to add them for you. What else can I do to help prevent falls?  Wear shoes that:  Do not have high heels.  Have rubber bottoms.  Are comfortable and fit you well.  Are closed at the toe. Do not wear sandals.  If you use a stepladder:  Make sure that it is fully opened. Do not climb a closed stepladder.  Make sure that both sides of the stepladder are locked into place.  Ask someone to hold it for you, if possible.  Clearly mark and make sure that you can see:  Any grab bars or handrails.  First and last steps.  Where the edge of each step is.  Use tools that help you move around (mobility aids) if they are needed. These include:  Canes.  Walkers.  Scooters.  Crutches.  Turn on the lights when you go into a dark area. Replace any light bulbs as soon as they burn out.  Set up your furniture so you have a clear path. Avoid moving your furniture around.  If any of your floors are uneven, fix them.  If there are any pets around you, be aware of where they are.  Review your medicines with your doctor. Some medicines can make you feel dizzy. This can increase your chance of falling. Ask your doctor what other things that you can do to help prevent falls. This information is not intended to replace advice given to you by your health care provider. Make sure you discuss any questions you have with your health care provider. Document Released: 11/03/2008 Document Revised: 06/15/2015 Document Reviewed: 02/11/2014 Elsevier Interactive Patient Education  2017 [employer].

## 2019-09-06 ENCOUNTER — Other Ambulatory Visit: Payer: Self-pay | Admitting: Family Medicine

## 2019-09-25 ENCOUNTER — Other Ambulatory Visit: Payer: Self-pay | Admitting: Family Medicine

## 2019-10-01 ENCOUNTER — Other Ambulatory Visit: Payer: Self-pay | Admitting: Family Medicine

## 2019-10-01 DIAGNOSIS — Z1231 Encounter for screening mammogram for malignant neoplasm of breast: Secondary | ICD-10-CM

## 2019-10-06 ENCOUNTER — Other Ambulatory Visit: Payer: Self-pay | Admitting: Family Medicine

## 2019-10-08 ENCOUNTER — Other Ambulatory Visit: Payer: Self-pay | Admitting: Family Medicine

## 2019-10-08 MED ORDER — CLONAZEPAM 0.5 MG PO TABS: ORAL_TABLET | ORAL | 0 refills | Status: DC

## 2019-10-31 DIAGNOSIS — S8001XA Contusion of right knee, initial encounter: Secondary | ICD-10-CM | POA: Diagnosis not present

## 2019-11-04 ENCOUNTER — Other Ambulatory Visit: Payer: Self-pay | Admitting: Family Medicine

## 2019-11-04 NOTE — Telephone Encounter (Signed)
Can this wait for Dr. Caryl Bis return?

## 2019-11-06 ENCOUNTER — Ambulatory Visit: Payer: Medicare Other

## 2019-11-09 ENCOUNTER — Other Ambulatory Visit: Payer: Self-pay

## 2019-11-09 ENCOUNTER — Ambulatory Visit
Admission: RE | Admit: 2019-11-09 | Discharge: 2019-11-09 | Disposition: A | Discharge status: Home / Self Care | Payer: Medicare Other | Source: Ambulatory Visit | Attending: Family Medicine | Admitting: Family Medicine

## 2019-11-09 DIAGNOSIS — Z1231 Encounter for screening mammogram for malignant neoplasm of breast: Secondary | ICD-10-CM | POA: Insufficient documentation

## 2019-11-16 ENCOUNTER — Other Ambulatory Visit: Payer: Self-pay | Admitting: Family Medicine

## 2019-11-16 DIAGNOSIS — E039 Hypothyroidism, unspecified: Secondary | ICD-10-CM

## 2019-11-23 ENCOUNTER — Other Ambulatory Visit: Payer: Self-pay

## 2019-11-23 ENCOUNTER — Encounter: Payer: Self-pay | Admitting: Family Medicine

## 2019-11-23 ENCOUNTER — Ambulatory Visit: Payer: Medicare Other | Admitting: Family Medicine

## 2019-11-23 VITALS — BP 120/80 | HR 75 | Temp 97.8°F | Ht 62.0 in | Wt 150.0 lb

## 2019-11-23 DIAGNOSIS — M25561 Pain in right knee: Secondary | ICD-10-CM

## 2019-11-23 DIAGNOSIS — F411 Generalized anxiety disorder: Secondary | ICD-10-CM

## 2019-11-23 DIAGNOSIS — Z23 Encounter for immunization: Secondary | ICD-10-CM

## 2019-11-23 DIAGNOSIS — E039 Hypothyroidism, unspecified: Secondary | ICD-10-CM

## 2019-11-23 DIAGNOSIS — W19XXXA Unspecified fall, initial encounter: Secondary | ICD-10-CM | POA: Diagnosis not present

## 2019-11-23 LAB — TSH: TSH: 0.46 u[IU]/mL (ref 0.35–4.50)

## 2019-11-23 NOTE — Assessment & Plan Note (Signed)
Stable.  She will continue Zoloft 100 mg once daily and clonazepam 0.5 mg once nightly.  Discussed risk of dependence and addiction with the clonazepam.  Discussed risk of dementia with benzodiazepines.  Discussed risk of falls.  She opts to continue on this medication as she gets benefit from it.

## 2019-11-23 NOTE — Assessment & Plan Note (Signed)
Check TSH.  Continue levothyroxine 75 mcg daily. 

## 2019-11-23 NOTE — Progress Notes (Signed)
  [name], MD Phone: [phone]  Jenny Roberts is a 74 y.o. female who presents today for f/u.  HYPOTHYROIDISM Disease Monitoring Weight changes: no  Skin Changes: no Heat/Cold intolerance: no  Medication Monitoring Compliance:  Taking synthroid   Last TSH:   Lab Results  Component Value Date   TSH 1.76 05/18/2019   Anxiety: Well-controlled.  She continues on Zoloft and clonazepam.  She notes these medications help her sleep.  No depression.  No SI.  No drowsiness with those medicines.  No alcohol intake.  She has been on clonazepam for about 20 years.  Fall: Patient with right knee injury from this.  She was walking into her closet with clothes in her arms and one of the shirts slipped out and she tripped over it.  She was evaluated at orthopedics and had an x-ray which she reports was negative.  She has recovered at this time.  Social History   Tobacco Use  Smoking Status Never Smoker  Smokeless Tobacco Never Used     ROS see history of present illness  Objective  Physical Exam Vitals:   11/23/19 0909  BP: 120/80  Pulse: 75  Temp: 97.8 F (36.6 C)  SpO2: 99%    BP Readings from Last 3 Encounters:  11/23/19 120/80  08/24/19 128/72  05/18/19 125/80   Wt Readings from Last 3 Encounters:  11/23/19 150 lb (68 kg)  08/24/19 148 lb (67.1 kg)  05/18/19 148 lb 9.6 oz (67.4 kg)    Physical Exam Constitutional:      General: She is not in acute distress.    Appearance: She is not diaphoretic.  Cardiovascular:     Rate and Rhythm: Normal rate and regular rhythm.     Heart sounds: Normal heart sounds.  Pulmonary:     Effort: Pulmonary effort is normal.     Breath sounds: Normal breath sounds.  Musculoskeletal:     Right lower leg: No edema.     Left lower leg: No edema.  Skin:    General: Skin is warm and dry.  Neurological:     Mental Status: She is alert.      Assessment/Plan: Please see individual problem list.  Problem List Items  Addressed This Visit    Anxiety state    Stable.  She will continue Zoloft 100 mg once daily and clonazepam 0.5 mg once nightly.  Discussed risk of dependence and addiction with the clonazepam.  Discussed risk of dementia with benzodiazepines.  Discussed risk of falls.  She opts to continue on this medication as she gets benefit from it.      Fall    Patient has improved after injury to right knee.  Seems to have been a purely mechanical fall.  She will monitor where she is going when she is walking.  Falls precaution information given.      Hypothyroidism    Check TSH.  Continue levothyroxine 75 mcg daily.      Relevant Orders   TSH       This visit occurred during the SARS-CoV-2 public health emergency.  Safety protocols were in place, including screening questions prior to the visit, additional usage of staff PPE, and extensive cleaning of exam room while observing appropriate contact time as indicated for disinfecting solutions.    [name], MD [place]

## 2019-11-23 NOTE — Patient Instructions (Signed)
Nice to see you. We will check your TSH today. Please monitor where you are going when you are walking.  It is important to avoid accidents which may result in broken bones.  Here are a few ideas on how to make your home safer so you will be less likely to trip or fall.  1. Use nonskid mats or non slip strips in your shower or tub, on your bathroom floor and around sinks.  If you know that you have spilled water, wipe it up! 2. In the bathroom, it is important to have properly installed grab bars on the walls or on the edge of the tub.  Towel racks are NOT strong enough for you to hold onto or to pull on for support. 3. Stairs and hallways should have enough light.  Add lamps or night lights if you need ore light. 4. It is good to have handrails on both sides of the stairs if possible.  Always fix broken handrails right away. 5. It is important to see the edges of steps.  Paint the edges of outdoor steps white so you can see them better.  Put colored tape on the edge of inside steps. 6. Throw-rugs are dangerous because they can slide.  Removing the rugs is the best idea, but if they must stay, add adhesive carpet tape to prevent slipping. 7. Do not keep things on stairs or in the halls.  Remove small furniture that blocks the halls as it may cause you to trip.  Keep telephone and electrical cords out of the way where you walk. 8. Always were sturdy, rubber-soled shoes for good support.  Never wear just socks, especially on the stairs.  Socks may cause you to slip or fall.  Do not wear full-length housecoats as you can easily trip on the bottom.  9. Place the things you use the most on the shelves that are the easiest to reach.  If you use a stepstool, make sure it is in good condition.  If you feel unsteady, DO NOT climb, ask for help. 10. If a health professional advises you to use a cane or walker, do not be ashamed.  These items can keep you from falling and breaking your bones.

## 2019-11-23 NOTE — Assessment & Plan Note (Addendum)
Patient has improved after injury to right knee.  Seems to have been a purely mechanical fall.  She will monitor where she is going when she is walking.  Falls precaution information given.

## 2019-12-08 ENCOUNTER — Other Ambulatory Visit: Payer: Self-pay | Admitting: Family Medicine

## 2020-01-22 HISTORY — PX: WRIST FRACTURE SURGERY: SHX121

## 2020-02-03 ENCOUNTER — Other Ambulatory Visit: Payer: Self-pay | Admitting: Family Medicine

## 2020-03-06 ENCOUNTER — Other Ambulatory Visit: Payer: Self-pay | Admitting: Family Medicine

## 2020-04-04 ENCOUNTER — Other Ambulatory Visit: Payer: Self-pay | Admitting: Family Medicine

## 2020-04-21 DIAGNOSIS — Z853 Personal history of malignant neoplasm of breast: Secondary | ICD-10-CM | POA: Diagnosis not present

## 2020-04-21 DIAGNOSIS — S52591B Other fractures of lower end of right radius, initial encounter for open fracture type I or II: Secondary | ICD-10-CM | POA: Diagnosis not present

## 2020-04-21 DIAGNOSIS — S52501A Unspecified fracture of the lower end of right radius, initial encounter for closed fracture: Secondary | ICD-10-CM | POA: Diagnosis not present

## 2020-04-21 DIAGNOSIS — Z9221 Personal history of antineoplastic chemotherapy: Secondary | ICD-10-CM | POA: Diagnosis not present

## 2020-04-21 DIAGNOSIS — W1830XA Fall on same level, unspecified, initial encounter: Secondary | ICD-10-CM | POA: Diagnosis not present

## 2020-04-21 DIAGNOSIS — I10 Essential (primary) hypertension: Secondary | ICD-10-CM | POA: Diagnosis not present

## 2020-04-21 DIAGNOSIS — S52571B Other intraarticular fracture of lower end of right radius, initial encounter for open fracture type I or II: Secondary | ICD-10-CM | POA: Diagnosis not present

## 2020-04-21 DIAGNOSIS — S6991XA Unspecified injury of right wrist, hand and finger(s), initial encounter: Secondary | ICD-10-CM | POA: Diagnosis not present

## 2020-04-21 DIAGNOSIS — G8918 Other acute postprocedural pain: Secondary | ICD-10-CM | POA: Diagnosis not present

## 2020-04-21 DIAGNOSIS — S52691A Other fracture of lower end of right ulna, initial encounter for closed fracture: Secondary | ICD-10-CM | POA: Diagnosis not present

## 2020-05-10 DIAGNOSIS — S52571E Other intraarticular fracture of lower end of right radius, subsequent encounter for open fracture type I or II with routine healing: Secondary | ICD-10-CM | POA: Diagnosis not present

## 2020-05-10 DIAGNOSIS — S52601A Unspecified fracture of lower end of right ulna, initial encounter for closed fracture: Secondary | ICD-10-CM | POA: Diagnosis not present

## 2020-05-10 DIAGNOSIS — S52501A Unspecified fracture of the lower end of right radius, initial encounter for closed fracture: Secondary | ICD-10-CM | POA: Diagnosis not present

## 2020-05-15 ENCOUNTER — Other Ambulatory Visit: Payer: Self-pay | Admitting: Family Medicine

## 2020-05-16 ENCOUNTER — Other Ambulatory Visit: Payer: Self-pay

## 2020-05-22 ENCOUNTER — Ambulatory Visit (INDEPENDENT_AMBULATORY_CARE_PROVIDER_SITE_OTHER): Payer: Medicare Other | Admitting: Family Medicine

## 2020-05-22 ENCOUNTER — Other Ambulatory Visit: Payer: Self-pay

## 2020-05-22 ENCOUNTER — Encounter: Payer: Self-pay | Admitting: Family Medicine

## 2020-05-22 DIAGNOSIS — E039 Hypothyroidism, unspecified: Secondary | ICD-10-CM

## 2020-05-22 DIAGNOSIS — F411 Generalized anxiety disorder: Secondary | ICD-10-CM | POA: Diagnosis not present

## 2020-05-22 DIAGNOSIS — E785 Hyperlipidemia, unspecified: Secondary | ICD-10-CM

## 2020-05-22 DIAGNOSIS — S5291XA Unspecified fracture of right forearm, initial encounter for closed fracture: Secondary | ICD-10-CM | POA: Insufficient documentation

## 2020-05-22 DIAGNOSIS — S5291XE Unspecified fracture of right forearm, subsequent encounter for open fracture type I or II with routine healing: Secondary | ICD-10-CM

## 2020-05-22 DIAGNOSIS — R195 Other fecal abnormalities: Secondary | ICD-10-CM

## 2020-05-22 HISTORY — DX: Unspecified fracture of right forearm, initial encounter for closed fracture: S52.91XA

## 2020-05-22 LAB — LIPID PANEL
Cholesterol: 137 mg/dL (ref 0–200)
HDL: 47.2 mg/dL (ref >39.00)
LDL Cholesterol: 76 mg/dL (ref 0–99)
NonHDL: 89.97
Total CHOL/HDL Ratio: 3
Triglycerides: 70 mg/dL (ref 0.0–149.0)
VLDL: 14 mg/dL (ref 0.0–40.0)

## 2020-05-22 LAB — COMPREHENSIVE METABOLIC PANEL
ALT: 17 U/L (ref 0–35)
AST: 18 U/L (ref 0–37)
Albumin: 4.2 g/dL (ref 3.5–5.2)
Alkaline Phosphatase: 102 U/L (ref 39–117)
BUN: 25 mg/dL — ABNORMAL HIGH (ref 6–23)
CO2: 28 mEq/L (ref 19–32)
Calcium: 9.5 mg/dL (ref 8.4–10.5)
Chloride: 104 mEq/L (ref 96–112)
Creatinine, Ser: 0.78 mg/dL (ref 0.40–1.20)
GFR: 74.46 mL/min (ref >60.00)
Glucose, Bld: 102 mg/dL — ABNORMAL HIGH (ref 70–99)
Potassium: 4 mEq/L (ref 3.5–5.1)
Sodium: 141 mEq/L (ref 135–145)
Total Bilirubin: 0.3 mg/dL (ref 0.2–1.2)
Total Protein: 6.1 g/dL (ref 6.0–8.3)

## 2020-05-22 LAB — TSH: TSH: 1.37 u[IU]/mL (ref 0.35–4.50)

## 2020-05-22 LAB — VITAMIN D 25 HYDROXY (VIT D DEFICIENCY, FRACTURES): VITD: 28.24 ng/mL — ABNORMAL LOW (ref 30.00–100.00)

## 2020-05-22 NOTE — Assessment & Plan Note (Signed)
Continue lovastatin 40 mg once daily.  Check lipid panel and CMP.

## 2020-05-22 NOTE — Assessment & Plan Note (Signed)
I had another long conversation with the patient regarding her need for colonoscopy.  Discussed that without a colonoscopy we could be missing a polyp or cancer.  Discussed that some polyps have the potential to turn into cancer.  Discussed the risk of these things progressing if they are present without evaluation.  Advised to have a colonoscopy completed.  She declines this at this time.  She will let us know if she changes her mind.

## 2020-05-22 NOTE — Assessment & Plan Note (Addendum)
The patient is doing well at this time.  She appears to be neurologically intact in her hand.  She will see orthopedics as scheduled in the next week or 2.  DEXA scan ordered to evaluate for osteoporosis.  We will also check vitamin D.

## 2020-05-22 NOTE — Assessment & Plan Note (Signed)
Check TSH.  Continue Synthroid 75 mcg once daily. 

## 2020-05-22 NOTE — Progress Notes (Signed)
Jenny Rumps, MD Phone: [phone]  Jenny Roberts is a 75 y.o. female who presents today for f/u.  Right forearm fracture: Patient she fell on 04/21/2020 at the [place].  She got her foot caught on the floor and fell forward.  The bone went through the skin.  She was seen at [facility] in [place] and had surgery for this.  She reports that he put in an extra long plate as her bone was soft.  She had follow-up with them to remove the initial cast and stitches.  She has follow-up with a local orthopedist on 5/11.  She has had no pain recently.  Anxiety: Patient notes this has been well controlled.  She takes clonazepam nightly with no drowsiness.  No alcohol intake.  She continues on the Zoloft.  No depression.  Hypothyroidism: Taking Synthroid.  No skin changes.  No heat or cold intolerance.  Hyperlipidemia: Taking lovastatin.  No chest pain, right upper quadrant pain, or myalgias.  History of positive Cologuard: Patient has not had a colonoscopy.  She has declined this in the past.  Social History   Tobacco Use  Smoking Status Never Smoker  Smokeless Tobacco Never Used    Current Outpatient Medications on File Prior to Visit  Medication Sig Dispense Refill  . acetaminophen (TYLENOL) 500 MG tablet Take by mouth.    Marland Kitchen aspirin 325 MG EC tablet Take 325 mg by mouth daily.    . benzonatate (TESSALON) 200 MG capsule Take 1 capsule (200 mg total) by mouth 2 (two) times daily as needed for cough. 20 capsule 0  . cholecalciferol (VITAMIN D) 1000 units tablet Take 1,000 Units by mouth daily.    . clonazePAM (KLONOPIN) 0.5 MG tablet TAKE 1 TABLET(0.5 MG) BY MOUTH DAILY 30 tablet 1  . famotidine (PEPCID) 20 MG tablet Take 20 mg by mouth 3 times/day as needed-between meals & bedtime for heartburn or indigestion.    . fluticasone (FLONASE) 50 MCG/ACT nasal spray Place 2 sprays into both nostrils daily. (Patient taking differently: Place 2 sprays into both nostrils  daily as needed for allergies.) 16 g 6  . ibuprofen (ADVIL,MOTRIN) 200 MG tablet Take by mouth.    . levothyroxine (SYNTHROID) 75 MCG tablet TAKE 1 TABLET(75 MCG) BY MOUTH DAILY 90 tablet 1  . lovastatin (MEVACOR) 40 MG tablet TAKE 1 TABLET(40 MG) BY MOUTH DAILY 90 tablet 3  . naproxen (NAPROSYN) 500 MG tablet     . sertraline (ZOLOFT) 100 MG tablet TAKE 1 TABLET(100 MG) BY MOUTH DAILY 90 tablet 1   No current facility-administered medications on file prior to visit.     ROS see history of present illness  Objective  Physical Exam Vitals:   05/22/20 0917  BP: 125/70  Pulse: 78  Temp: (!) 97.5 F (36.4 C)  SpO2: 97%    BP Readings from Last 3 Encounters:  05/22/20 125/70  11/23/19 120/80  08/24/19 128/72   Wt Readings from Last 3 Encounters:  05/22/20 148 lb (67.1 kg)  11/23/19 150 lb (68 kg)  08/24/19 148 lb (67.1 kg)    Physical Exam Constitutional:      General: She is not in acute distress.    Appearance: She is not diaphoretic.  Cardiovascular:     Rate and Rhythm: Normal rate and regular rhythm.     Heart sounds: Normal heart sounds.  Pulmonary:     Effort: Pulmonary effort is normal.     Breath sounds: Normal breath sounds.  Musculoskeletal:     Comments: Cast on right arm, sensation intact right fingers, able to move all of her fingers  Skin:    General: Skin is warm and dry.  Neurological:     Mental Status: She is alert.      Assessment/Plan: Please see individual problem list.  Problem List Items Addressed This Visit    Anxiety state    Stable.  She will continue Zoloft 100 mg once daily.  She can continue clonazepam 0.5 mg by mouth nightly before bed.      Hyperlipidemia    Continue lovastatin 40 mg once daily.  Check lipid panel and CMP.      Relevant Medications   aspirin 325 MG EC tablet   Hypothyroidism    Check TSH.  Continue Synthroid 75 mcg once daily.      Positive colorectal cancer screening using Cologuard test    I had  another long conversation with the patient regarding her need for colonoscopy.  Discussed that without a colonoscopy we could be missing a polyp or cancer.  Discussed that some polyps have the potential to turn into cancer.  Discussed the risk of these things progressing if they are present without evaluation.  Advised to have a colonoscopy completed.  She declines this at this time.  She will let us know if she changes her mind.      Right forearm fracture    The patient is doing well at this time.  She appears to be neurologically intact in her hand.  She will see orthopedics as scheduled in the next week or 2.  DEXA scan ordered to evaluate for osteoporosis.  We will also check vitamin D.          This visit occurred during the SARS-CoV-2 public health emergency.  Safety protocols were in place, including screening questions prior to the visit, additional usage of staff PPE, and extensive cleaning of exam room while observing appropriate contact time as indicated for disinfecting solutions.    Jenny Rumps, MD [place]

## 2020-05-22 NOTE — Patient Instructions (Signed)
Nice to see. Please call to schedule your bone density scan at [phone]. If you change your mind regarding your colonoscopy please let us know. Please keep your visit with the orthopedist. We will let you know what your labs reveal.

## 2020-05-22 NOTE — Assessment & Plan Note (Signed)
Stable.  She will continue Zoloft 100 mg once daily.  She can continue clonazepam 0.5 mg by mouth nightly before bed.

## 2020-05-31 DIAGNOSIS — Z8781 Personal history of (healed) traumatic fracture: Secondary | ICD-10-CM | POA: Diagnosis not present

## 2020-05-31 DIAGNOSIS — Z9889 Other specified postprocedural states: Secondary | ICD-10-CM | POA: Diagnosis not present

## 2020-05-31 DIAGNOSIS — S52601A Unspecified fracture of lower end of right ulna, initial encounter for closed fracture: Secondary | ICD-10-CM | POA: Diagnosis not present

## 2020-05-31 DIAGNOSIS — S52501A Unspecified fracture of the lower end of right radius, initial encounter for closed fracture: Secondary | ICD-10-CM | POA: Diagnosis not present

## 2020-06-07 ENCOUNTER — Other Ambulatory Visit: Payer: Self-pay | Admitting: Family Medicine

## 2020-06-07 DIAGNOSIS — E039 Hypothyroidism, unspecified: Secondary | ICD-10-CM

## 2020-06-07 NOTE — Telephone Encounter (Signed)
RX Refill:klonopin Last Seen:05-22-20 Last ordered:04-05-20

## 2020-06-08 ENCOUNTER — Ambulatory Visit
Admission: RE | Admit: 2020-06-08 | Discharge: 2020-06-08 | Disposition: A | Discharge status: Home / Self Care | Payer: Medicare Other | Source: Ambulatory Visit | Attending: Family Medicine | Admitting: Family Medicine

## 2020-06-08 ENCOUNTER — Other Ambulatory Visit: Payer: Self-pay

## 2020-06-08 DIAGNOSIS — X58XXXA Exposure to other specified factors, initial encounter: Secondary | ICD-10-CM | POA: Diagnosis not present

## 2020-06-08 DIAGNOSIS — M810 Age-related osteoporosis without current pathological fracture: Secondary | ICD-10-CM | POA: Insufficient documentation

## 2020-06-08 DIAGNOSIS — S5291XE Unspecified fracture of right forearm, subsequent encounter for open fracture type I or II with routine healing: Secondary | ICD-10-CM | POA: Insufficient documentation

## 2020-06-08 DIAGNOSIS — M8589 Other specified disorders of bone density and structure, multiple sites: Secondary | ICD-10-CM | POA: Diagnosis not present

## 2020-06-13 ENCOUNTER — Other Ambulatory Visit: Payer: Self-pay

## 2020-06-13 ENCOUNTER — Encounter: Payer: Self-pay | Admitting: Occupational Therapy

## 2020-06-13 ENCOUNTER — Ambulatory Visit: Payer: Medicare Other | Attending: Orthopedic Surgery | Admitting: Occupational Therapy

## 2020-06-13 DIAGNOSIS — M25631 Stiffness of right wrist, not elsewhere classified: Secondary | ICD-10-CM | POA: Diagnosis not present

## 2020-06-13 DIAGNOSIS — M6281 Muscle weakness (generalized): Secondary | ICD-10-CM | POA: Insufficient documentation

## 2020-06-13 DIAGNOSIS — L905 Scar conditions and fibrosis of skin: Secondary | ICD-10-CM | POA: Insufficient documentation

## 2020-06-13 DIAGNOSIS — M25531 Pain in right wrist: Secondary | ICD-10-CM | POA: Insufficient documentation

## 2020-06-13 NOTE — Therapy (Signed)
[facility] [address], [place], [address] Phone: [phone]   Fax:  [phone]  Occupational Therapy Evaluation  Patient Details  Name: Jenny Roberts MRN: [id number] Date of Birth: 06-Apr-1945 Referring Provider (OT): DR Rudene Christians   Encounter Date: 06/13/2020   OT End of Session - 06/13/20 6962    Visit Number 1    Number of Visits 10    Date for OT Re-Evaluation 07/25/20    OT Start Time 0905    OT Stop Time 0952    OT Time Calculation (min) 47 min    Activity Tolerance Patient tolerated treatment well    Behavior During Therapy [facility] for tasks assessed/performed           Past Medical History:  Diagnosis Date  . Anxiety   . Breast cancer ([place]) 2001   Right. Treated with lumpectomy followed by Chemotherapy and Radiation therapy  . Depression   . Hyperlipidemia   . Nontraumatic rupture of tendon of thumb 06/15/2014  . Personal history of chemotherapy   . Personal history of radiation therapy   . Skin cancer 09/2004   treated at [facility]  . Thyroid disease     Past Surgical History:  Procedure Laterality Date  . ABDOMINAL HYSTERECTOMY  1983   Left parts of each ovary   . BREAST BIOPSY Right 2001   positive  . BREAST EXCISIONAL BIOPSY Right 1990's   benign  . 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  Focal positive posterior margin.  0/5 lymph nodes.  . CARPAL TUNNEL RELEASE  2016  . COLONOSCOPY  2008  . COLONOSCOPY WITH PROPOFOL N/A 02/16/2018   Procedure: COLONOSCOPY WITH PROPOFOL;  Surgeon: Jonathon Bellows, MD;  Location: [facility] ENDOSCOPY;  Service: Endoscopy;  Laterality: N/A;  . KNEE SURGERY  2003  . mole excision  2006   Basal cell   . WRIST SURGERY Right 2010    There were no vitals filed for this visit.   Subjective Assessment - 06/13/20 1312    Subjective  I could not believe I fell and broke my  wrist- I take care of my grandkids and great Roberts kids, do quilting and cross stitch , and some yard work    Pertinent History Jenny Roberts is a 75 y.o. female  fell on April 1st 2022, - and suffered a open forearm fracture with comminution through the distal radial metaphysis and distal ulna. Patient denies any numbness or tingling. No warmth erythema or drainage. Pain is been well controlled. Surgery was performed about 8 wks  ago at [facility] in [facility].  She is right-hand dominant followed by [facility] clinic ortho and refer to OT 11th of May    Patient Stated Goals I want to get my motion and strength back in my R hand and wrist so I can do my hair, fasten my bra, cut food, carry and lift objects and open bottles, quilt and take care of my Roberts and great Roberts kids    Currently in Pain? Yes    Pain Score 4     Pain Location Wrist    Pain Orientation Right    Pain Descriptors / Indicators Aching;Tightness;Tender    Pain Type Surgical pain    Pain Onset More than a month ago    Pain Frequency Intermittent  [facility] OT Assessment - 06/13/20 0001      Assessment   Medical Diagnosis R distal radius and ulna fx wtih ORIF    Referring Provider (OT) DR Rudene Christians    Onset Date/Surgical Date 04/21/20    Hand Dominance Right      Precautions   Required Braces or Orthoses --   wrist brace - can take in the house it some off     Balance Screen   Has the patient fallen in the past 6 months Yes    How many times? 1    Has the patient had a decrease in activity level because of a fear of falling?  No    Is the patient reluctant to leave their home because of a fear of falling?  No      Home  Environment   Lives With Spouse      Prior Function   Vocation Retired    Leisure take care of Roberts and great Roberts kids, quilt, cross stitch , yard work      AROM   Overall AROM Comments Fisting and opposition WNL    Right Forearm Pronation 90 Degrees    Right  Forearm Supination 80 Degrees    Right Wrist Extension 45 Degrees    Right Wrist Flexion 40 Degrees    Right Wrist Radial Deviation 3 Degrees    Right Wrist Ulnar Deviation 18 Degrees    Left Wrist Extension 75 Degrees    Left Wrist Flexion 90 Degrees    Left Wrist Radial Deviation 20 Degrees    Left Wrist Ulnar Deviation 40 Degrees      Strength   Right Hand Grip (lbs) 15    Right Hand Lateral Pinch 5 lbs    Right Hand 3 Point Pinch 2 lbs    Left Hand Grip (lbs) 40    Left Hand Lateral Pinch 9 lbs    Left Hand 3 Point Pinch 10 lbs                    OT Treatments/Exercises (OP) - 06/13/20 0001      RUE Fluidotherapy   Number Minutes Fluidotherapy 8 Minutes    RUE Fluidotherapy Location Hand;Wrist    Comments AROM for wrist in all planes prior to review of HEP          Review with pt HEP - heat or contrast prior to HEP  Scar massage  AAROM/PROM  for wrist flexion, ext, RD ,UD and sup 10 reps  AROM for wrist sup, pron, RD, UD , flexion , extention over table or armrest  10reps  2-3 x day        OT Education - 06/13/20 1743    Education Details findings of eval and HEP    Person(s) Educated Patient    Methods Explanation;Demonstration;Tactile cues;Verbal cues;Handout    Comprehension Verbal cues required;Returned demonstration;Verbalized understanding            OT Short Term Goals - 06/13/20 1752      OT SHORT TERM GOAL #1   Title Pt to be independent in HEP to decrease scar tissue and increase AROM in wrist in all planes to decrease pain to less than 2/10 during day    Baseline pain increase 4-8/10 wtih use or ROM , scar tissue adhesions distally and decrease AROM for wrist see flowsheet    Time 3    Period Weeks    Status New    Target Date  07/04/20             OT Long Term Goals - 06/13/20 1753      OT LONG TERM GOAL #1   Title R wrist AROM increase to [facility] to use hand in more than 75% to turn doorknob, fasten bra, toilet hygiene, curl  hair    Baseline R wrist ext 45, flexion 40, UD 18 , RD 3 ,sup 80    Time 4    Period Weeks    Status New    Target Date 07/11/20      OT LONG TERM GOAL #2   Title R wrist strength increase to more than 4+/5 in all planes to push door open, turn dooknob , cut food, carry more than 5 lbs without increase symptoms    Baseline no strenght yet - eval today - decrease ROM - pain 4-8/10 with use or during day    Time 6    Period Weeks    Status New    Target Date 07/25/20      OT LONG TERM GOAL #3   Title R grip and prehension strength increase with more than 10 lbs and  4 lbs to carry more than 5 lbs, do quilting and play with grandkids/great grandkids    Baseline Grip 15,R ,L 40; lat grip R 5 ,L 9lbs, 3 point R 2 , L 10 - and cannot quilt , play blocks with ggkids , or carry more than 1 lbs without pain    Time 6    Period Weeks    Status New    Target Date 07/25/20                 Plan - 06/13/20 1745    Clinical Impression Statement Pt present at OT eval 7 1/2 wks out from a open forearm fracture with comminution through the distal radial metaphysis and distal ulna - had ORIF. Pt  show decrease R wrist ROM in all planes , decrease strength and increase scar adhesion ,  pain from 4-8/10 during the day - limiting her functional use of R dominant hand in ADL's and IADL's. Pt is active 75 yr old lady and had wrist fx in same wrist about 6 yrs ago and fusion of thumb IP - but had AROM close to normal per pt and had no issues. Pt can benefit from OT services to return to prior level.    OT Occupational Profile and History Problem Focused Assessment - Including review of records relating to presenting problem    Occupational performance deficits (Please refer to evaluation for details): ADL's;IADL's;Play;Leisure;Social Participation    Body Structure / Function / Physical Skills ADL;Flexibility;IADL;ROM;Scar mobility;Pain;Strength    Rehab Potential Good    Clinical Decision Making  Limited treatment options, no task modification necessary    Comorbidities Affecting Occupational Performance: May have comorbidities impacting occupational performance   Had wrist fx 6-7 yrs ago in same wrist   Modification or Assistance to Complete Evaluation  No modification of tasks or assist necessary to complete eval    OT Frequency 2x / week    OT Duration 6 weeks    OT Treatment/Interventions Self-care/ADL training;Paraffin;Fluidtherapy;Contrast Bath;Therapeutic exercise;Manual Therapy;DME and/or AE instruction;Splinting;Therapeutic activities;Scar mobilization;Passive range of motion;Patient/family education    Consulted and Agree with Plan of Care Patient           Patient will benefit from skilled therapeutic intervention in order to improve the following deficits and impairments:   Body Structure /  Function / Physical Skills: ADL,Flexibility,IADL,ROM,Scar mobility,Pain,Strength       Visit Diagnosis: Stiffness of right wrist, not elsewhere classified - Plan: Ot plan of care cert/re-cert  Scar tissue - Plan: Ot plan of care cert/re-cert  Pain in right wrist - Plan: Ot plan of care cert/re-cert  Muscle weakness (generalized) - Plan: Ot plan of care cert/re-cert    Problem List Patient Active Problem List   Diagnosis Date Noted  . Right forearm fracture 05/22/2020  . Fall 11/23/2019  . Benign essential tremor 09/22/2018  . Right shoulder pain 04/06/2018  . Positive colorectal cancer screening using Cologuard test 04/06/2018  . Strain of buttock, right, initial encounter 12/26/2017  . Hypothyroidism 07/05/2015  . Vitamin D deficiency 07/05/2015  . Encounter for screening mammogram for breast cancer 12/19/2014  . Anxiety state 06/15/2014  . Hyperlipidemia 06/15/2014  . Personal history of malignant neoplasm of breast 09/29/2012    Jenny Roberts OTR/L,CLT 06/13/2020, 5:59 PM  [facility] [address] [place], [address] Phone: [phone]   Fax:  [phone]  Name: LYSBETH DICOLA MRN: [id number] Date of Birth: [birth year]

## 2020-06-15 ENCOUNTER — Other Ambulatory Visit: Payer: Self-pay

## 2020-06-15 ENCOUNTER — Ambulatory Visit: Payer: Medicare Other | Admitting: Occupational Therapy

## 2020-06-15 DIAGNOSIS — M25531 Pain in right wrist: Secondary | ICD-10-CM | POA: Diagnosis not present

## 2020-06-15 DIAGNOSIS — M6281 Muscle weakness (generalized): Secondary | ICD-10-CM

## 2020-06-15 DIAGNOSIS — M25631 Stiffness of right wrist, not elsewhere classified: Secondary | ICD-10-CM | POA: Diagnosis not present

## 2020-06-15 DIAGNOSIS — L905 Scar conditions and fibrosis of skin: Secondary | ICD-10-CM | POA: Diagnosis not present

## 2020-06-15 NOTE — Therapy (Signed)
[facility] [address], [place], [address] Phone: [phone]   Fax:  [phone]  Occupational Therapy Treatment  Patient Details  Name: Jenny Roberts MRN: [id number] Date of Birth: [birth year] Referring Provider (OT): DR Rudene Christians   Encounter Date: 06/15/2020   OT End of Session - 06/15/20 1791    Visit Number 2    Number of Visits 10    Date for OT Re-Evaluation 07/25/20    OT Start Time 5056    OT Stop Time 1608    OT Time Calculation (min) 35 min    Activity Tolerance Patient tolerated treatment well    Behavior During Therapy [facility] for tasks assessed/performed           Past Medical History:  Diagnosis Date  . Anxiety   . Breast cancer ([place]) 2001   Right. Treated with lumpectomy followed by Chemotherapy and Radiation therapy  . Depression   . Hyperlipidemia   . Nontraumatic rupture of tendon of thumb 06/15/2014  . Personal history of chemotherapy   . Personal history of radiation therapy   . Skin cancer 09/2004   treated at [facility]  . Thyroid disease     Past Surgical History:  Procedure Laterality Date  . ABDOMINAL HYSTERECTOMY  1983   Left parts of each ovary   . BREAST BIOPSY Right 2001   positive  . BREAST EXCISIONAL BIOPSY Right 1990's   benign  . 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  Focal positive posterior margin.  0/5 lymph nodes.  . CARPAL TUNNEL RELEASE  2016  . COLONOSCOPY  2008  . COLONOSCOPY WITH PROPOFOL N/A 02/16/2018   Procedure: COLONOSCOPY WITH PROPOFOL;  Surgeon: Jonathon Bellows, MD;  Location: [facility] ENDOSCOPY;  Service: Endoscopy;  Laterality: N/A;  . KNEE SURGERY  2003  . mole excision  2006   Basal cell   . WRIST SURGERY Right 2010    There were no vitals filed for this visit.   Subjective Assessment - 06/15/20 1532    Subjective  Feels good- about 40% in soft splint and hard  one only when out and about - no pain - more motion and little more strength it feels    Pertinent History Jenny Roberts is a 75 y.o. female  fell on April 1st 2022, - and suffered a open forearm fracture with comminution through the distal radial metaphysis and distal ulna. Patient denies any numbness or tingling. No warmth erythema or drainage. Pain is been well controlled. Surgery was performed about 8 wks  ago at [facility] in [facility].  She is right-hand dominant followed by [facility] clinic ortho and refer to OT 11th of May    Patient Stated Goals I want to get my motion and strength back in my R hand and wrist so I can do my hair, fasten my bra, cut food, carry and lift objects and open bottles, quilt and take care of my grand and great grand kids    Currently in Pain? No/denies              [facility] OT Assessment - 06/15/20 0001      AROM   Right Forearm Supination 90 Degrees    Right Wrist Extension 45 Degrees    Right Wrist Flexion 50 Degrees    Right Wrist Radial Deviation 10 Degrees    Right Wrist Ulnar  Deviation 20 Degrees      Strength   Right Hand Grip (lbs) 25    Right Hand Lateral Pinch 5 lbs    Right Hand 3 Point Pinch 5 lbs           Pt show increase AROM and grip/lat grip since earlier this week with eval -see flowsheet         OT Treatments/Exercises (OP) - 06/15/20 0001      RUE Fluidotherapy   Number Minutes Fluidotherapy 8 Minutes    RUE Fluidotherapy Location Hand;Wrist    Comments AROM for wrist in all planes           Pt to cont with contrast or heat prior to Review of HEP    Scar massage review and scar still adhere distally - and limiting her wrist flexion and extention - done kinesiotaping done this date - distall 30% parallel -and across at 100% pull- pt ed on allergies and precautions   AAROM/PROM  for wrist flexion, ext, RD ,UD review with pt again - needed min A - Supination WNL this date  10 reps  1 lbs weight  done and add to HEP in all planes- except wear Benik neoprene is needed for wrist flexion -, ext 10reps  2 x day  Putty - light blue easy putty for grip , lat and 3 point grip - 12 reps  Can increase to 2nd set in 3 days if pain free          OT Education - 06/15/20 1532    Education Details progress and HEP review    Person(s) Educated Patient    Methods Explanation;Demonstration;Tactile cues;Verbal cues;Handout    Comprehension Verbal cues required;Returned demonstration;Verbalized understanding            OT Short Term Goals - 06/13/20 1752      OT SHORT TERM GOAL #1   Title Pt to be independent in HEP to decrease scar tissue and increase AROM in wrist in all planes to decrease pain to less than 2/10 during day    Baseline pain increase 4-8/10 wtih use or ROM , scar tissue adhesions distally and decrease AROM for wrist see flowsheet    Time 3    Period Weeks    Status New    Target Date 07/04/20             OT Long Term Goals - 06/13/20 1753      OT LONG TERM GOAL #1   Title R wrist AROM increase to [facility] to use hand in more than 75% to turn doorknob, fasten bra, toilet hygiene, curl hair    Baseline R wrist ext 45, flexion 40, UD 18 , RD 3 ,sup 80    Time 4    Period Weeks    Status New    Target Date 07/11/20      OT LONG TERM GOAL #2   Title R wrist strength increase to more than 4+/5 in all planes to push door open, turn dooknob , cut food, carry more than 5 lbs without increase symptoms    Baseline no strenght yet - eval today - decrease ROM - pain 4-8/10 with use or during day    Time 6    Period Weeks    Status New    Target Date 07/25/20      OT LONG TERM GOAL #3   Title R grip and prehension strength increase with more than 10 lbs and  4 lbs to carry more than 5 lbs, do quilting and play with grandkids/great grandkids    Baseline Grip 15,R ,L 40; lat grip R 5 ,L 9lbs, 3 point R 2 , L 10 - and cannot quilt , play blocks with ggkids , or carry more than  1 lbs without pain    Time 6    Period Weeks    Status New    Target Date 07/25/20                 Plan - 06/15/20 1533    Clinical Impression Statement Pt present at OT eval 6 1/2  wks out from a open forearm fracture with comminution through the distal radial metaphysis and distal ulna - had ORIF. Pt show increase AROM in wrist and forearm since earlier this week- increase grip and lat grip  Pt cont to show decrease R wrist ROM in all planes , decrease strength and increase scar adhesion distally. Pain improve and had no pain this session except with wrist flexion with 1 lbs- pt to wear her Benik for that - but otherwise can do 1 lbs and light easy putty for grip , lat and 3 point - painfree - decrease AROM and strength limiting her functional use of R dominant hand in ADL's and IADL's. Pt is active 75 yr old lady and had wrist fx in same wrist about 6 yrs ago and fusion of thumb IP - but had AROM close to normal per pt and had no issues. Pt can benefit from OT services to return to prior level.    OT Occupational Profile and History Problem Focused Assessment - Including review of records relating to presenting problem    Occupational performance deficits (Please refer to evaluation for details): ADL's;IADL's;Play;Leisure;Social Participation    Body Structure / Function / Physical Skills ADL;Flexibility;IADL;ROM;Scar mobility;Pain;Strength    Rehab Potential Good    Clinical Decision Making Limited treatment options, no task modification necessary    Comorbidities Affecting Occupational Performance: May have comorbidities impacting occupational performance    Modification or Assistance to Complete Evaluation  No modification of tasks or assist necessary to complete eval    OT Frequency 2x / week    OT Duration 6 weeks    OT Treatment/Interventions Self-care/ADL training;Paraffin;Fluidtherapy;Contrast Bath;Therapeutic exercise;Manual Therapy;DME and/or AE  instruction;Splinting;Therapeutic activities;Scar mobilization;Passive range of motion;Patient/family education    Consulted and Agree with Plan of Care Patient           Patient will benefit from skilled therapeutic intervention in order to improve the following deficits and impairments:   Body Structure / Function / Physical Skills: ADL,Flexibility,IADL,ROM,Scar mobility,Pain,Strength       Visit Diagnosis: Scar tissue  Pain in right wrist  Muscle weakness (generalized)  Stiffness of right wrist, not elsewhere classified    Problem List Patient Active Problem List   Diagnosis Date Noted  . Right forearm fracture 05/22/2020  . Fall 11/23/2019  . Benign essential tremor 09/22/2018  . Right shoulder pain 04/06/2018  . Positive colorectal cancer screening using Cologuard test 04/06/2018  . Strain of buttock, right, initial encounter 12/26/2017  . Hypothyroidism 07/05/2015  . Vitamin D deficiency 07/05/2015  . Encounter for screening mammogram for breast cancer 12/19/2014  . Anxiety state 06/15/2014  . Hyperlipidemia 06/15/2014  . Personal history of malignant neoplasm of breast 09/29/2012    Rosalyn Gess OTR/L,CLT 06/15/2020, 4:19 PM  [facility] [address] [place],  [place], [address] Phone: [phone]   Fax:  [phone]  Name: Jenny Roberts MRN: [id number] Date of Birth: 20-Aug-1945

## 2020-06-20 ENCOUNTER — Ambulatory Visit: Payer: Medicare Other | Admitting: Occupational Therapy

## 2020-06-20 ENCOUNTER — Other Ambulatory Visit: Payer: Self-pay

## 2020-06-20 DIAGNOSIS — M25531 Pain in right wrist: Secondary | ICD-10-CM | POA: Diagnosis not present

## 2020-06-20 DIAGNOSIS — L905 Scar conditions and fibrosis of skin: Secondary | ICD-10-CM

## 2020-06-20 DIAGNOSIS — M25631 Stiffness of right wrist, not elsewhere classified: Secondary | ICD-10-CM | POA: Diagnosis not present

## 2020-06-20 DIAGNOSIS — M6281 Muscle weakness (generalized): Secondary | ICD-10-CM | POA: Diagnosis not present

## 2020-06-20 NOTE — Therapy (Signed)
[place] [facility] [address], [place], [address] Phone: [phone]   Fax:  [phone]  Occupational Therapy Treatment  Patient Details  Name: Jenny Roberts MRN: [id number] Date of Birth: [birth year] Referring Provider (OT): DR Rudene Christians   Encounter Date: 06/20/2020   OT End of Session - 06/20/20 1424    Visit Number 3    Number of Visits 10    Date for OT Re-Evaluation 07/25/20    OT Start Time 4967    OT Stop Time 1402    OT Time Calculation (min) 47 min    Activity Tolerance Patient tolerated treatment well    Behavior During Therapy [facility] for tasks assessed/performed           Past Medical History:  Diagnosis Date  . Anxiety   . Breast cancer ([place]) 2001   Right. Treated with lumpectomy followed by Chemotherapy and Radiation therapy  . Depression   . Hyperlipidemia   . Nontraumatic rupture of tendon of thumb 06/15/2014  . Personal history of chemotherapy   . Personal history of radiation therapy   . Skin cancer 09/2004   treated at [facility]  . Thyroid disease     Past Surgical History:  Procedure Laterality Date  . ABDOMINAL HYSTERECTOMY  1983   Left parts of each ovary   . BREAST BIOPSY Right 2001   positive  . BREAST EXCISIONAL BIOPSY Right 1990's   benign  . 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  Focal positive posterior margin.  0/5 lymph nodes.  . CARPAL TUNNEL RELEASE  2016  . COLONOSCOPY  2008  . COLONOSCOPY WITH PROPOFOL N/A 02/16/2018   Procedure: COLONOSCOPY WITH PROPOFOL;  Surgeon: Jonathon Bellows, MD;  Location: [facility] ENDOSCOPY;  Service: Endoscopy;  Laterality: N/A;  . KNEE SURGERY  2003  . mole excision  2006   Basal cell   . WRIST SURGERY Right 2010    There were no vitals filed for this visit.   Subjective Assessment - 06/20/20 1317    Subjective  My hand and wrist was so sore on Friday and  did not do my exercises - but then Sat and the rest of weekend was okay - using it more - but put my hard splint on when around the grandkids and going outside with them    Pertinent History Jenny Roberts is a 75 y.o. female  fell on April 1st 2022, - and suffered a open forearm fracture with comminution through the distal radial metaphysis and distal ulna. Patient denies any numbness or tingling. No warmth erythema or drainage. Pain is been well controlled. Surgery was performed about 8 wks  ago at [facility] in [facility].  She is right-hand dominant followed by [facility] clinic ortho and refer to OT 11th of May    Patient Stated Goals I want to get my motion and strength back in my R hand and wrist so I can do my hair, fasten my bra, cut food, carry and lift objects and open bottles, quilt and take care of my grand and great grand kids              [facility] OT Assessment - 06/20/20 0001      AROM   Right Forearm Pronation 90 Degrees    Right Forearm Supination 90 Degrees    Right Wrist Extension 55 Degrees   60  in session   Right Wrist Flexion 55 Degrees   60 in session   Right Wrist Radial Deviation 10 Degrees    Right Wrist Ulnar Deviation 25 Degrees      Strength   Right Hand Grip (lbs) 25    Right Hand Lateral Pinch 6 lbs    Right Hand 3 Point Pinch 5 lbs    Left Hand Grip (lbs) 40    Left Hand Lateral Pinch 9 lbs    Left Hand 3 Point Pinch 10 lbs           Pt show increase AROM in wrist and grip and lat grip - see  Flow sheet  some pain with wrist flexion - on radial volar wrist - did show some arthritic changes on xray  Tenderness over distal and radial volar scar - pt to hold off on scar massage for 48thrs           OT Treatments/Exercises (OP) - 06/20/20 0001      RUE Fluidotherapy   Number Minutes Fluidotherapy 8 Minutes    RUE Fluidotherapy Location Hand;Wrist    Comments AROM for wrist in all planes            Pt to cont with contrast  or heat prior to Review of HEP  Done kinesiotaping  this date - distall 30% parallel -and across at 100% pull- pt ed on allergies and precautions   AAROM/PROM for wrist flexion, ext, RD ,UD review with pt again - needed min A - Supination WNL this date  10 reps  CPM for wrist flexion , ext done 2 x 200 sec   1 lbs weight done - 2 sets of 12 reps - 2 x day  Can use if needed  Benik neoprene  But did not need it this date - 2 sets of 12 - 2 x day   Upgrade to teal  putty for grip - 2 x 12 reps - 2 x day pain free  And cont 1 set of 12 - 2 x day for  lat and 3 point grip          OT Education - 06/20/20 1424    Education Details progress and HEP review    Person(s) Educated Patient    Methods Explanation;Demonstration;Tactile cues;Verbal cues;Handout    Comprehension Verbal cues required;Returned demonstration;Verbalized understanding            OT Short Term Goals - 06/13/20 1752      OT SHORT TERM GOAL #1   Title Pt to be independent in HEP to decrease scar tissue and increase AROM in wrist in all planes to decrease pain to less than 2/10 during day    Baseline pain increase 4-8/10 wtih use or ROM , scar tissue adhesions distally and decrease AROM for wrist see flowsheet    Time 3    Period Weeks    Status New    Target Date 07/04/20             OT Long Term Goals - 06/13/20 1753      OT LONG TERM GOAL #1   Title R wrist AROM increase to [facility] to use hand in more than 75% to turn doorknob, fasten bra, toilet hygiene, curl hair    Baseline R wrist ext 45, flexion 40, UD 18 , RD 3 ,sup 80    Time 4    Period Weeks    Status New    Target Date 07/11/20  OT LONG TERM GOAL #2   Title R wrist strength increase to more than 4+/5 in all planes to push door open, turn dooknob , cut food, carry more than 5 lbs without increase symptoms    Baseline no strenght yet - eval today - decrease ROM - pain 4-8/10 with use or during day    Time 6    Period Weeks    Status  New    Target Date 07/25/20      OT LONG TERM GOAL #3   Title R grip and prehension strength increase with more than 10 lbs and  4 lbs to carry more than 5 lbs, do quilting and play with grandkids/great grandkids    Baseline Grip 15,R ,L 40; lat grip R 5 ,L 9lbs, 3 point R 2 , L 10 - and cannot quilt , play blocks with ggkids , or carry more than 1 lbs without pain    Time 6    Period Weeks    Status New    Target Date 07/25/20                 Plan - 06/20/20 1424    Clinical Impression Statement Pt present at OT eval 8 1/2  wks out from a open forearm fracture with comminution through the distal radial metaphysis and distal ulna - had ORIF. Pt show increase AROM in wrist and forearm since last week- increase grip and lat grip. Pt do have history of thumb IP fusion.  Pt cont to show decrease R wrist ROM in all planes , decrease strength and increase scar adhesion distally. Pto do have arthritic changes at distal radius / thumb CMC joint. Pain with wrist flexion mostly on volar radial wrist and tenderness. Pt to hold off on scar mobs and use the kinesiotape for the next 48hrs. Pt to cont with 1 lbs weight but increase putty to med teal for grip and prehension - but painfree - cont to show decrease AROM and strength limiting her functional use of R dominant hand in ADL's and IADL's. Pt is active 75 yr old lady and had wrist fx in same wrist about 6 yrs ago and fusion of thumb IP - but had AROM close to normal per pt and had no issues. Pt can benefit from OT services to return to prior level.    OT Occupational Profile and History Problem Focused Assessment - Including review of records relating to presenting problem    Occupational performance deficits (Please refer to evaluation for details): ADL's;IADL's;Play;Leisure;Social Participation    Body Structure / Function / Physical Skills ADL;Flexibility;IADL;ROM;Scar mobility;Pain;Strength    Rehab Potential Good    Clinical Decision Making  Limited treatment options, no task modification necessary    Comorbidities Affecting Occupational Performance: May have comorbidities impacting occupational performance    Modification or Assistance to Complete Evaluation  No modification of tasks or assist necessary to complete eval    OT Frequency 2x / week    OT Duration 6 weeks    OT Treatment/Interventions Self-care/ADL training;Paraffin;Fluidtherapy;Contrast Bath;Therapeutic exercise;Manual Therapy;DME and/or AE instruction;Splinting;Therapeutic activities;Scar mobilization;Passive range of motion;Patient/family education    Consulted and Agree with Plan of Care Patient           Patient will benefit from skilled therapeutic intervention in order to improve the following deficits and impairments:   Body Structure / Function / Physical Skills: ADL,Flexibility,IADL,ROM,Scar mobility,Pain,Strength       Visit Diagnosis: Scar tissue  Pain in right wrist  Muscle weakness (generalized)  Stiffness of right wrist, not elsewhere classified    Problem List Patient Active Problem List   Diagnosis Date Noted  . Right forearm fracture 05/22/2020  . Fall 11/23/2019  . Benign essential tremor 09/22/2018  . Right shoulder pain 04/06/2018  . Positive colorectal cancer screening using Cologuard test 04/06/2018  . Strain of buttock, right, initial encounter 12/26/2017  . Hypothyroidism 07/05/2015  . Vitamin D deficiency 07/05/2015  . Encounter for screening mammogram for breast cancer 12/19/2014  . Anxiety state 06/15/2014  . Hyperlipidemia 06/15/2014  . Personal history of malignant neoplasm of breast 09/29/2012    Rosalyn Gess OTR/L,CLT 06/20/2020, 2:28 PM  [place] [facility] [address], [place], [address] Phone: [phone]   Fax:  [phone]  Name: CHRISTON GALLAWAY MRN: [id number] Date of Birth: [birth year]

## 2020-06-22 ENCOUNTER — Other Ambulatory Visit: Payer: Self-pay

## 2020-06-22 ENCOUNTER — Ambulatory Visit: Payer: Medicare Other | Attending: Orthopedic Surgery | Admitting: Occupational Therapy

## 2020-06-22 DIAGNOSIS — M6281 Muscle weakness (generalized): Secondary | ICD-10-CM | POA: Diagnosis not present

## 2020-06-22 DIAGNOSIS — L905 Scar conditions and fibrosis of skin: Secondary | ICD-10-CM | POA: Diagnosis not present

## 2020-06-22 DIAGNOSIS — M25631 Stiffness of right wrist, not elsewhere classified: Secondary | ICD-10-CM | POA: Diagnosis not present

## 2020-06-22 DIAGNOSIS — M25531 Pain in right wrist: Secondary | ICD-10-CM | POA: Insufficient documentation

## 2020-06-22 NOTE — Therapy (Signed)
[facility] [address], [place], [address] Phone: [phone]   Fax:  [phone]  Occupational Therapy Treatment  Patient Details  Name: Jenny Roberts MRN: [id number] Date of Birth: March 25, 1945 Referring Provider (OT): DR Rudene Christians   Encounter Date: 06/22/2020   OT End of Session - 06/22/20 0951    Visit Number 4    Number of Visits 10    Date for OT Re-Evaluation 07/25/20    OT Start Time 0951    OT Stop Time 1030    OT Time Calculation (min) 39 min    Activity Tolerance Patient tolerated treatment well    Behavior During Therapy [facility] for tasks assessed/performed           Past Medical History:  Diagnosis Date  . Anxiety   . Breast cancer ([place]) 2001   Right. Treated with lumpectomy followed by Chemotherapy and Radiation therapy  . Depression   . Hyperlipidemia   . Nontraumatic rupture of tendon of thumb 06/15/2014  . Personal history of chemotherapy   . Personal history of radiation therapy   . Skin cancer 09/2004   [facility] at [facility]  . Thyroid disease     Past Surgical History:  Procedure Laterality Date  . ABDOMINAL HYSTERECTOMY  1983   Left parts of each ovary   . BREAST BIOPSY Right 2001   positive  . BREAST EXCISIONAL BIOPSY Right 1990's   benign  . 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  Focal positive posterior margin.  0/5 lymph nodes.  . CARPAL TUNNEL RELEASE  2016  . COLONOSCOPY  2008  . COLONOSCOPY WITH PROPOFOL N/A 02/16/2018   Procedure: COLONOSCOPY WITH PROPOFOL;  Surgeon: Jonathon Bellows, MD;  Location: [facility] ENDOSCOPY;  Service: Endoscopy;  Laterality: N/A;  . KNEE SURGERY  2003  . mole excision  2006   Basal cell   . WRIST SURGERY Right 2010    There were no vitals filed for this visit.   Subjective Assessment - 06/22/20 0951    Subjective  Scar is better- not as tender - no pain -and  using it more - exerciese did okay    Pertinent History Jenny Roberts is a 75 y.o. female  fell on April 1st 2022, - and suffered a open forearm fracture with comminution through the distal radial metaphysis and distal ulna. Patient denies any numbness or tingling. No warmth erythema or drainage. Pain is been well controlled. Surgery was performed about 8 wks  ago at [facility] in [facility]  She is right-hand dominant followed by [facility] clinic ortho and refer to OT 11th of May    Patient Stated Goals I want to get my motion and strength back in my R hand and wrist so I can do my hair, fasten my bra, cut food, carry and lift objects and open bottles, quilt and take care of my grand and great grand kids    Currently in Pain? No/denies              [facility] OT Assessment - 06/22/20 0001      Strength   Right Hand Grip (lbs) 29                    OT Treatments/Exercises (OP) - 06/22/20 0001      RUE Fluidotherapy   Number Minutes Fluidotherapy 8 Minutes  RUE Fluidotherapy Location Hand;Wrist    Comments AROM for wrist in all planes            Pt to cont with contrast or heat prior to Review of HEP Done kinesiotaping  this date and ed for husband to change during weekend - distall 30% parallel -and across at 100% pull- pt ed on allergies  Tolerate well last time  AAROM/PROM for wrist flexion, ext, RD ,UDreview with pt again - needed min A - Supination WNL this date 10 reps CPM for wrist flexion , ext done 2 x 200 sec   1 lbs weight done - 2 sets of 12 reps - 2 x day  No pain without Benik - can add 3rd session and use  Benik neoprene for 3rd if needed     teal  putty for grip - 3 x 12 reps - 2 x day pain free  And cont 2 sets of 12 - 2 x day for  lat and 3 point grip          OT Education - 06/22/20 0951    Education Details progress and HEP review    Person(s) Educated Patient    Methods Explanation;Demonstration;Tactile  cues;Verbal cues;Handout    Comprehension Verbal cues required;Returned demonstration;Verbalized understanding            OT Short Term Goals - 06/13/20 1752      OT SHORT TERM GOAL #1   Title Pt to be independent in HEP to decrease scar tissue and increase AROM in wrist in all planes to decrease pain to less than 2/10 during day    Baseline pain increase 4-8/10 wtih use or ROM , scar tissue adhesions distally and decrease AROM for wrist see flowsheet    Time 3    Period Weeks    Status New    Target Date 07/04/20             OT Long Term Goals - 06/13/20 1753      OT LONG TERM GOAL #1   Title R wrist AROM increase to [facility] to use hand in more than 75% to turn doorknob, fasten bra, toilet hygiene, curl hair    Baseline R wrist ext 45, flexion 40, UD 18 , RD 3 ,sup 80    Time 4    Period Weeks    Status New    Target Date 07/11/20      OT LONG TERM GOAL #2   Title R wrist strength increase to more than 4+/5 in all planes to push door open, turn dooknob , cut food, carry more than 5 lbs without increase symptoms    Baseline no strenght yet - eval today - decrease ROM - pain 4-8/10 with use or during day    Time 6    Period Weeks    Status New    Target Date 07/25/20      OT LONG TERM GOAL #3   Title R grip and prehension strength increase with more than 10 lbs and  4 lbs to carry more than 5 lbs, do quilting and play with grandkids/great grandkids    Baseline Grip 15,R ,L 40; lat grip R 5 ,L 9lbs, 3 point R 2 , L 10 - and cannot quilt , play blocks with ggkids , or carry more than 1 lbs without pain    Time 6    Period Weeks    Status New    Target Date 07/25/20  Plan - 06/22/20 0951    Clinical Impression Statement Pt present at OT eval 9  wks out from a open forearm fracture with comminution through the distal radial metaphysis and distal ulna - had ORIF. Pt show increase AROM in wrist and forearm since last week- increase grip and lat grip. Pt  do have history of thumb IP fusion.  Pt cont to show decrease R wrist ROM in all planes , decrease strength and increase scar adhesion distally. Pto do have arthritic changes at distal radius / thumb CMC joint. Pain this date since earlier this year - better - able to do PROM and weight without Benik neoprene splint. Tenderness better with using tape instead of scar massage since last time. Cont to use kinesiotape for the next few days. Pt to cont with 1 lbs weight but increase putty to med teal for grip and prehension - but painfree - cont to show decrease AROM and strength limiting her functional use of R dominant hand in ADL's and IADL's. Pt is active 75 yr old lady and had wrist fx in same wrist about 6 yrs ago and fusion of thumb IP - but had AROM close to normal per pt and had no issues. Pt can benefit from OT services to return to prior level.    OT Occupational Profile and History Problem Focused Assessment - Including review of records relating to presenting problem    Occupational performance deficits (Please refer to evaluation for details): ADL's;IADL's;Play;Leisure;Social Participation    Body Structure / Function / Physical Skills ADL;Flexibility;IADL;ROM;Scar mobility;Pain;Strength    Rehab Potential Good    Clinical Decision Making Limited treatment options, no task modification necessary    Comorbidities Affecting Occupational Performance: May have comorbidities impacting occupational performance    Modification or Assistance to Complete Evaluation  No modification of tasks or assist necessary to complete eval    OT Duration 6 weeks    OT Treatment/Interventions Self-care/ADL training;Paraffin;Fluidtherapy;Contrast Bath;Therapeutic exercise;Manual Therapy;DME and/or AE instruction;Splinting;Therapeutic activities;Scar mobilization;Passive range of motion;Patient/family education    Consulted and Agree with Plan of Care Patient           Patient will benefit from skilled therapeutic  intervention in order to improve the following deficits and impairments:   Body Structure / Function / Physical Skills: ADL,Flexibility,IADL,ROM,Scar mobility,Pain,Strength       Visit Diagnosis: Scar tissue  Pain in right wrist  Muscle weakness (generalized)  Stiffness of right wrist, not elsewhere classified    Problem List Patient Active Problem List   Diagnosis Date Noted  . Right forearm fracture 05/22/2020  . Fall 11/23/2019  . Benign essential tremor 09/22/2018  . Right shoulder pain 04/06/2018  . Positive colorectal cancer screening using Cologuard test 04/06/2018  . Strain of buttock, right, initial encounter 12/26/2017  . Hypothyroidism 07/05/2015  . Vitamin D deficiency 07/05/2015  . Encounter for screening mammogram for breast cancer 12/19/2014  . Anxiety state 06/15/2014  . Hyperlipidemia 06/15/2014  . Personal history of malignant neoplasm of breast 09/29/2012    Jenny Roberts OTR/L,CLT 06/22/2020, 12:24 PM  [place] [facility] [address], [place], [address] Phone: [phone]   Fax:  [phone]  Name: Jenny Roberts MRN: [id number] Date of Birth: April 15, 1945

## 2020-06-26 ENCOUNTER — Other Ambulatory Visit: Payer: Self-pay | Admitting: Family Medicine

## 2020-06-27 ENCOUNTER — Other Ambulatory Visit: Payer: Self-pay

## 2020-06-27 ENCOUNTER — Ambulatory Visit: Payer: Medicare Other | Admitting: Occupational Therapy

## 2020-06-27 DIAGNOSIS — M25631 Stiffness of right wrist, not elsewhere classified: Secondary | ICD-10-CM | POA: Diagnosis not present

## 2020-06-27 DIAGNOSIS — M25531 Pain in right wrist: Secondary | ICD-10-CM | POA: Diagnosis not present

## 2020-06-27 DIAGNOSIS — L905 Scar conditions and fibrosis of skin: Secondary | ICD-10-CM

## 2020-06-27 DIAGNOSIS — M6281 Muscle weakness (generalized): Secondary | ICD-10-CM | POA: Diagnosis not present

## 2020-06-27 NOTE — Therapy (Signed)
[place] [facility] [address] [place], [address] Phone: [phone]   Fax:  [phone]  Occupational Therapy Treatment  Patient Details  Name: Jenny Roberts MRN: [id number] Date of Birth: 05-18-1945 Referring Provider (OT): DR Rudene Christians   Encounter Date: 06/27/2020   OT End of Session - 06/27/20 1158    Visit Number 5    Number of Visits 10    Date for OT Re-Evaluation 07/25/20    OT Start Time 1115    OT Stop Time 1155    OT Time Calculation (min) 40 min    Activity Tolerance Patient tolerated treatment well    Behavior During Therapy [facility] for tasks assessed/performed           Past Medical History:  Diagnosis Date  . Anxiety   . Breast cancer ([place]) 2001   Right. Treated with lumpectomy followed by Chemotherapy and Radiation therapy  . Depression   . Hyperlipidemia   . Nontraumatic rupture of tendon of thumb 06/15/2014  . Personal history of chemotherapy   . Personal history of radiation therapy   . Skin cancer 09/2004   treated at [facility]  . Thyroid disease     Past Surgical History:  Procedure Laterality Date  . ABDOMINAL HYSTERECTOMY  1983   Left parts of each ovary   . BREAST BIOPSY Right 2001   positive  . BREAST EXCISIONAL BIOPSY Right 1990's   benign  . 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  Focal positive posterior margin.  0/5 lymph nodes.  . CARPAL TUNNEL RELEASE  2016  . COLONOSCOPY  2008  . COLONOSCOPY WITH PROPOFOL N/A 02/16/2018   Procedure: COLONOSCOPY WITH PROPOFOL;  Surgeon: Jonathon Bellows, MD;  Location: [facility] ENDOSCOPY;  Service: Endoscopy;  Laterality: N/A;  . KNEE SURGERY  2003  . mole excision  2006   Basal cell   . WRIST SURGERY Right 2010    There were no vitals filed for this visit.   Subjective Assessment - 06/27/20 1157    Subjective  Doing better - only pain is that base of the  thumb at the wrist with some of the motions- putty is easy and 1 lbs weight I am doing 3 sets- and using the soft splint more than 50% of time - but keeping 3 grandbabies at the moment    Pertinent History Jenny Roberts is a 75 y.o. female  fell on April 1st 2022, - and suffered a open forearm fracture with comminution through the distal radial metaphysis and distal ulna. Patient denies any numbness or tingling. No warmth erythema or drainage. Pain is been well controlled. Surgery was performed about 8 wks  ago at [facility] in [facility].  She is right-hand dominant followed by [facility] clinic ortho and refer to OT 11th of May    Patient Stated Goals I want to get my motion and strength back in my R hand and wrist so I can do my hair, fasten my bra, cut food, carry and lift objects and open bottles, quilt and take care of my grand and great grand kids    Currently in Pain? No/denies              [facility] OT Assessment - 06/27/20 0001      AROM   Right Forearm Pronation 90 Degrees    Right Forearm Supination 90 Degrees  Right Wrist Extension 58 Degrees    Right Wrist Flexion 60 Degrees    Right Wrist Radial Deviation 15 Degrees    Right Wrist Ulnar Deviation 25 Degrees      Strength   Right Hand Grip (lbs) 31    Right Hand Lateral Pinch 6 lbs    Right Hand 3 Point Pinch 6 lbs    Left Hand Grip (lbs) 40    Left Hand Lateral Pinch 9 lbs    Left Hand 3 Point Pinch 10 lbs                    OT Treatments/Exercises (OP) - 06/27/20 0001      RUE Fluidotherapy   Number Minutes Fluidotherapy 8 Minutes    RUE Fluidotherapy Location Hand;Wrist    Comments AROM for wrist in all palnes             Pt to cont with contrast or heat prior to Review of HEP Pt  To cont with kinesiotaping  for husband to change - distall 30% parallel -and across at 100% pull- pt ed on allergies  Tolerate well last time  AAROM/PROM for wrist flexion, ext, RD ,UD  Supination  WNL this date 10 reps Add this date table slides on towel roll- but keep thumb off - for [facility]  Arthritis- 20 reps tolerate well  CPM for wrist flexion done 1 x 200 sec  Upgrade to 2 lbs weight - 1 set of 12 reps - 2 x day  For wrist in all planes- no issues     tealputty for grip  And prehension - but add 1/3 of green firm to teal 1 set of 12 - 2 x day  Pain free         OT Education - 06/27/20 1158    Education Details progress and HEP review    Person(s) Educated Patient    Methods Explanation;Demonstration;Tactile cues;Verbal cues;Handout    Comprehension Verbal cues required;Returned demonstration;Verbalized understanding            OT Short Term Goals - 06/13/20 1752      OT SHORT TERM GOAL #1   Title Pt to be independent in HEP to decrease scar tissue and increase AROM in wrist in all planes to decrease pain to less than 2/10 during day    Baseline pain increase 4-8/10 wtih use or ROM , scar tissue adhesions distally and decrease AROM for wrist see flowsheet    Time 3    Period Weeks    Status New    Target Date 07/04/20             OT Long Term Goals - 06/13/20 1753      OT LONG TERM GOAL #1   Title R wrist AROM increase to [facility] to use hand in more than 75% to turn doorknob, fasten bra, toilet hygiene, curl hair    Baseline R wrist ext 45, flexion 40, UD 18 , RD 3 ,sup 80    Time 4    Period Weeks    Status New    Target Date 07/11/20      OT LONG TERM GOAL #2   Title R wrist strength increase to more than 4+/5 in all planes to push door open, turn dooknob , cut food, carry more than 5 lbs without increase symptoms    Baseline no strenght yet - eval today - decrease ROM - pain 4-8/10 with use or during day  Time 6    Period Weeks    Status New    Target Date 07/25/20      OT LONG TERM GOAL #3   Title R grip and prehension strength increase with more than 10 lbs and  4 lbs to carry more than 5 lbs, do quilting and play with grandkids/great  grandkids    Baseline Grip 15,R ,L 40; lat grip R 5 ,L 9lbs, 3 point R 2 , L 10 - and cannot quilt , play blocks with ggkids , or carry more than 1 lbs without pain    Time 6    Period Weeks    Status New    Target Date 07/25/20                 Plan - 06/27/20 1159    Clinical Impression Statement Pt present at OT eval 9 1/2  wks out from a open forearm fracture with comminution through the distal radial metaphysis and distal ulna - had ORIF. Pt show good progress in AROM for wrist and grip/prehension strength since SOC. Report increase use of R hand in ADL's andf IADL's.  Pt do have history of thumb IP fusion.  Pt cont to show decrease R wrist ROM in all planes , decrease strength and increase scar adhesion distally. Pto do have arthritic changes at distal radius / thumb CMC joint. Upgrade this date pt to med  teal putty for grip and prehension -and increase to 2 lbs weight for wrist  in all planes - but 1 sets of all 2 x day  - cont to show decrease AROM and strength limiting her functional use of R dominant hand in ADL's and IADL's. Pt is active 75 yr old lady and had wrist fx in same wrist about 6 yrs ago and fusion of thumb IP - but had AROM close to normal per pt and had no issues. Pt can benefit from OT services to return to prior level.    OT Occupational Profile and History Problem Focused Assessment - Including review of records relating to presenting problem    Occupational performance deficits (Please refer to evaluation for details): ADL's;IADL's;Play;Leisure;Social Participation    Body Structure / Function / Physical Skills ADL;Flexibility;IADL;ROM;Scar mobility;Pain;Strength    Rehab Potential Good    Clinical Decision Making Limited treatment options, no task modification necessary    Comorbidities Affecting Occupational Performance: May have comorbidities impacting occupational performance    Modification or Assistance to Complete Evaluation  No modification of tasks or  assist necessary to complete eval    OT Frequency 2x / week    OT Duration 4 weeks    OT Treatment/Interventions Self-care/ADL training;Paraffin;Fluidtherapy;Contrast Bath;Therapeutic exercise;Manual Therapy;DME and/or AE instruction;Splinting;Therapeutic activities;Scar mobilization;Passive range of motion;Patient/family education    Consulted and Agree with Plan of Care Patient           Patient will benefit from skilled therapeutic intervention in order to improve the following deficits and impairments:   Body Structure / Function / Physical Skills: ADL,Flexibility,IADL,ROM,Scar mobility,Pain,Strength       Visit Diagnosis: Pain in right wrist  Muscle weakness (generalized)  Stiffness of right wrist, not elsewhere classified  Scar tissue    Problem List Patient Active Problem List   Diagnosis Date Noted  . Right forearm fracture 05/22/2020  . Fall 11/23/2019  . Benign essential tremor 09/22/2018  . Right shoulder pain 04/06/2018  . Positive colorectal cancer screening using Cologuard test 04/06/2018  . Strain of buttock, right,  initial encounter 12/26/2017  . Hypothyroidism 07/05/2015  . Vitamin D deficiency 07/05/2015  . Encounter for screening mammogram for breast cancer 12/19/2014  . Anxiety state 06/15/2014  . Hyperlipidemia 06/15/2014  . Personal history of malignant neoplasm of breast 09/29/2012    Rosalyn Gess OTR/L,CLT 06/27/2020, 12:02 PM  [place] [facility] [address], [place], [address] Phone: [phone]   Fax:  [phone]  Name: Jenny Roberts MRN: [id number] Date of Birth: 03/18/1945

## 2020-06-29 ENCOUNTER — Ambulatory Visit: Payer: Medicare Other | Admitting: Occupational Therapy

## 2020-07-06 ENCOUNTER — Other Ambulatory Visit: Payer: Self-pay

## 2020-07-06 ENCOUNTER — Ambulatory Visit: Payer: Medicare Other | Admitting: Occupational Therapy

## 2020-07-06 ENCOUNTER — Other Ambulatory Visit: Payer: Self-pay | Admitting: Family Medicine

## 2020-07-06 DIAGNOSIS — L905 Scar conditions and fibrosis of skin: Secondary | ICD-10-CM

## 2020-07-06 DIAGNOSIS — M6281 Muscle weakness (generalized): Secondary | ICD-10-CM

## 2020-07-06 DIAGNOSIS — M25631 Stiffness of right wrist, not elsewhere classified: Secondary | ICD-10-CM

## 2020-07-06 DIAGNOSIS — M25531 Pain in right wrist: Secondary | ICD-10-CM | POA: Diagnosis not present

## 2020-07-06 NOTE — Telephone Encounter (Signed)
RX Refill:klonopin Last Seen:05-22-20  Last ordered:06-07-20

## 2020-07-06 NOTE — Therapy (Signed)
[place] [facility] [address], [place], [address] Phone: [phone]   Fax:  [phone]  Occupational Therapy Treatment  Patient Details  Name: Jenny Roberts MRN: [id number] Date of Birth: 27-Feb-1945 Referring Provider (OT): DR Rudene Christians   Encounter Date: 07/06/2020   OT End of Session - 07/06/20 1443     Visit Number 6    Number of Visits 10    Date for OT Re-Evaluation 07/25/20    OT Start Time 1324    OT Stop Time 1356    OT Time Calculation (min) 32 min    Activity Tolerance Patient tolerated treatment well    Behavior During Therapy [facility] for tasks assessed/performed             Past Medical History:  Diagnosis Date   Anxiety    Breast cancer ([place]) 2001   Right. 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  Focal positive posterior margin.  0/5 lymph nodes.   CARPAL TUNNEL RELEASE  2016   COLONOSCOPY  2008   COLONOSCOPY WITH PROPOFOL N/A 02/16/2018   Procedure: COLONOSCOPY WITH PROPOFOL;  Surgeon: Jonathon Bellows, MD;  Location: [facility] ENDOSCOPY;  Service: Endoscopy;  Laterality: N/A;   KNEE SURGERY  2003   mole excision  2006   Basal cell    WRIST SURGERY Right 2010    There were no vitals filed for this visit.   Subjective Assessment - 07/06/20 1329     Subjective  Close to back to normal - planting flowers , digging in  yard- playing grandkids - exercises doing okay - just some pain at the base of the thumb - but had some pain there before    Pertinent History Jenny Roberts is a 75 y.o. female  fell on April 1st 2022, - and suffered a open forearm fracture with comminution through the distal radial metaphysis and distal ulna. Patient denies any numbness or tingling. No warmth erythema or drainage. Pain is been well controlled. Surgery was performed about 8 wks  ago at [facility] in [facility].  She is right-hand dominant followed by [facility] clinic ortho and refer to OT 11th of May    Patient Stated Goals I want to get my motion and strength back in my R hand and wrist so I can do my hair, fasten my bra, cut food, carry and lift objects and open bottles, quilt and take care of my grand and great grand kids                [facility] OT Assessment - 07/06/20 0001       AROM   Right Wrist Extension 65 Degrees    Right Wrist Flexion 70 Degrees      Strength   Right Hand Grip (lbs) 35  Right Hand Lateral Pinch 7 lbs    Right Hand 3 Point Pinch 6 lbs    Left Hand Grip (lbs) 40    Left Hand Lateral Pinch 9 lbs    Left Hand 3 Point Pinch 10 lbs                 Pt to cont with contrast or heat prior to Review of HEP   Pt  To cont with kinesiotaping  for husband to change - distall 30% parallel -and across at 100% pull- pt ed on allergies Tolerate well last time   AAROM/PROM  for wrist flexion, ext, RD ,UD Or over the armrest wrist flexion and table slides for wrist extention - randomly during day Cont with table slides on towel roll- but keep thumb off - for [facility]  Arthritis- 20 reps tolerate well Wall pushups - 10 reps - 2 x day pain free   2 lbs weight  - 2 sets of 12 reps - 2 x day  Increase to 3rd set the next week - pain free for  wrist in all planes- no issues      Upgrade to green firm putty for grip only - HOLD on prehension  12 reps - 2 x day  Increase the next week  to 2nd and 3rd set  Pain free    Possible discharge next session                 OT Education - 07/06/20 1442     Education Details progress and HEP review    Person(s) Educated Patient    Methods Explanation;Demonstration;Tactile cues;Verbal cues;Handout    Comprehension Verbal cues required;Returned demonstration;Verbalized understanding              OT Short Term Goals - 06/13/20 1752       OT SHORT TERM GOAL #1   Title Pt to be independent in HEP to decrease scar tissue and increase AROM in wrist in all planes to decrease pain to less than 2/10 during day    Baseline pain increase 4-8/10 wtih use or ROM , scar tissue adhesions distally and decrease AROM for wrist see flowsheet    Time 3    Period Weeks    Status New    Target Date 07/04/20               OT Long Term Goals - 06/13/20 1753       OT LONG TERM GOAL #1   Title R wrist AROM increase to [facility] to use hand in more than 75% to turn doorknob, fasten bra, toilet hygiene, curl hair    Baseline R wrist ext 45, flexion 40, UD 18 , RD 3 ,sup 80    Time 4    Period Weeks    Status New    Target Date 07/11/20      OT LONG TERM GOAL #2   Title R wrist strength increase to more than 4+/5 in all planes to push door open, turn dooknob , cut food, carry more than 5 lbs without increase symptoms    Baseline no strenght yet - eval today - decrease ROM - pain 4-8/10 with use or during day    Time 6    Period Weeks    Status New    Target Date 07/25/20      OT LONG TERM GOAL #3   Title R grip and prehension strength increase with more than 10 lbs and  4 lbs to carry more than 5 lbs, do quilting and play with grandkids/great grandkids    Baseline Grip 15,R ,L 40; lat grip R 5 ,L 9lbs, 3 point R 2 , L 10 - and cannot quilt , play blocks with ggkids , or carry more than 1 lbs without pain    Time 6    Period Weeks    Status New    Target Date 07/25/20                   Plan - 07/06/20 1551      Clinical Impression Statement Pt present at OT eval 10wks out from a open forearm fracture with comminution through the distal radial metaphysis and distal ulna - had ORIF. Pt show good progress in AROM for wrist and grip/prehension strength since SOC. Report increase use of R hand in ADL's andf IADL's.  Pt do have history of thumb IP fusion.  Pt cont to show decrease R wrist ROM in flexion and extention mostly, decrease strength and increase scar adhesion distally - but pt had wrist fx years ago. Pto do have arthritic changes at distal radius / thumb CMC joint. Upgrade this date pt to green putty but only for gripping - HOLD off on prehension - pt to cont with 2 lbs weight for wrist  in all planes but increase to 2nd and 3rd set the next week or 2 - follow up in 2 wks for possible discharge. Pt is active 75 yr old lady and had wrist fx in same wrist about 6 yrs ago and fusion of thumb IP - but had AROM close to normal per pt and had no issues. Pt can benefit from OT services to return to prior level.    OT Occupational Profile and History Problem Focused Assessment - Including review of records relating to presenting problem    Occupational performance deficits (Please refer to evaluation for details): ADL's;IADL's;Play;Leisure;Social Participation    Body Structure / Function / Physical Skills ADL;Flexibility;IADL;ROM;Scar mobility;Pain;Strength    Rehab Potential Good    Clinical Decision Making Limited treatment options, no task modification necessary    Comorbidities Affecting Occupational Performance: May have comorbidities impacting occupational performance    Modification or Assistance to Complete Evaluation  No modification of tasks or assist necessary to complete eval    OT Frequency 2x / week    OT Duration 4 weeks    OT Treatment/Interventions Self-care/ADL training;Paraffin;Fluidtherapy;Contrast Bath;Therapeutic exercise;Manual Therapy;DME and/or AE instruction;Splinting;Therapeutic  activities;Scar mobilization;Passive range of motion;Patient/family education    Consulted and Agree with Plan of Care Patient             Patient will benefit from skilled therapeutic intervention in order to improve the following deficits and impairments:   Body Structure / Function / Physical Skills: ADL, Flexibility, IADL, ROM, Scar mobility, Pain, Strength       Visit Diagnosis: Muscle weakness (generalized)  Stiffness of right wrist, not elsewhere classified  Scar tissue  Pain in right wrist    Problem List Patient Active Problem List   Diagnosis Date Noted   Right forearm fracture 05/22/2020   Fall 11/23/2019   Benign essential tremor 09/22/2018   Right shoulder pain 04/06/2018   Positive colorectal cancer screening using Cologuard test 04/06/2018   Strain of buttock, right, initial encounter 12/26/2017   Hypothyroidism 07/05/2015   Vitamin D deficiency 07/05/2015   Encounter for screening mammogram for breast cancer 12/19/2014   Anxiety state 06/15/2014   Hyperlipidemia  06/15/2014   Personal history of malignant neoplasm of breast 09/29/2012    Jenny Roberts OTR/L,CLT 07/06/2020, 3:56 PM  [place] [address] [place], [address] Phone: [phone]   Fax:  [phone]  Name: Jenny Roberts MRN: [id number] Date of Birth: [birth year]

## 2020-07-12 ENCOUNTER — Ambulatory Visit: Payer: Medicare Other | Admitting: Family Medicine

## 2020-07-12 DIAGNOSIS — Z8781 Personal history of (healed) traumatic fracture: Secondary | ICD-10-CM | POA: Diagnosis not present

## 2020-07-12 DIAGNOSIS — M25531 Pain in right wrist: Secondary | ICD-10-CM | POA: Diagnosis not present

## 2020-07-12 DIAGNOSIS — Z9889 Other specified postprocedural states: Secondary | ICD-10-CM | POA: Diagnosis not present

## 2020-07-20 ENCOUNTER — Other Ambulatory Visit: Payer: Self-pay

## 2020-07-20 ENCOUNTER — Ambulatory Visit: Payer: Medicare Other | Admitting: Occupational Therapy

## 2020-07-20 ENCOUNTER — Ambulatory Visit (INDEPENDENT_AMBULATORY_CARE_PROVIDER_SITE_OTHER): Payer: Medicare Other | Admitting: Family Medicine

## 2020-07-20 DIAGNOSIS — M810 Age-related osteoporosis without current pathological fracture: Secondary | ICD-10-CM | POA: Insufficient documentation

## 2020-07-20 NOTE — Progress Notes (Signed)
[name], MD Phone: [phone]  Jenny Roberts is a 75 y.o. female who presents today for follow-up.  Osteoporosis: Diagnosed on recent DEXA scan with a T score of -4.1.  She had a recent fracture of her right forearm.  She has been on vitamin D 2000 international units once daily.  She does have a history of radiation from her breast cancer.  No history of MI or stroke.  She does not take a calcium supplement.  Social History   Tobacco Use  Smoking Status Never  Smokeless Tobacco Never    Current Outpatient Medications on File Prior to Visit  Medication Sig Dispense Refill   acetaminophen (TYLENOL) 500 MG tablet Take by mouth.     aspirin 325 MG EC tablet Take 325 mg by mouth daily.     benzonatate (TESSALON) 200 MG capsule Take 1 capsule (200 mg total) by mouth 2 (two) times daily as needed for cough. 20 capsule 0   cholecalciferol (VITAMIN D) 1000 units tablet Take 1,000 Units by mouth daily.     clonazePAM (KLONOPIN) 0.5 MG tablet TAKE 1 TABLET(0.5 MG) BY MOUTH DAILY 30 tablet 0   famotidine (PEPCID) 20 MG tablet Take 20 mg by mouth 3 times/day as needed-between meals & bedtime for heartburn or indigestion.     fluticasone (FLONASE) 50 MCG/ACT nasal spray Place 2 sprays into both nostrils daily. (Patient taking differently: Place 2 sprays into both nostrils daily as needed for allergies.) 16 g 6   ibuprofen (ADVIL,MOTRIN) 200 MG tablet Take by mouth.     levothyroxine (SYNTHROID) 75 MCG tablet TAKE 1 TABLET(75 MCG) BY MOUTH DAILY 90 tablet 1   lovastatin (MEVACOR) 40 MG tablet TAKE 1 TABLET(40 MG) BY MOUTH DAILY 90 tablet 3   naproxen (NAPROSYN) 500 MG tablet      sertraline (ZOLOFT) 100 MG tablet TAKE 1 TABLET BY MOUTH DAILY 90 tablet 1   No current facility-administered medications on file prior to visit.     ROS see history of present illness  Objective  Physical Exam Vitals:   07/20/20 1441  BP: 120/80  Pulse: 76  Temp: 98.2 F (36.8 C)  SpO2: 96%     BP Readings from Last 3 Encounters:  07/20/20 120/80  05/22/20 125/70  11/23/19 120/80   Wt Readings from Last 3 Encounters:  07/20/20 149 lb 3.2 oz (67.7 kg)  05/22/20 148 lb (67.1 kg)  11/23/19 150 lb (68 kg)    Physical Exam Cardiovascular:     Rate and Rhythm: Normal rate and regular rhythm.     Heart sounds: Normal heart sounds.     Assessment/Plan: Please see individual problem list.  Problem List Items Addressed This Visit     Osteoporosis    Patient meets criteria for [place] to treat her osteoporosis.  We will see about getting this approved.  We will check a vitamin D level to ensure that this is normal.  I did discuss the risk of body and joint aches with this medication.  Also discussed the risk of osteonecrosis of the jaw advised to let us know if she develops jaw discomfort and to let her dentist know that she is taking this medication.       Relevant Orders   Vitamin D (25 hydroxy)    No follow-ups on file.  This visit occurred during the SARS-CoV-2 public health emergency.  Safety protocols were in place, including screening questions prior to the visit, additional usage of staff PPE, and extensive  cleaning of exam room while observing appropriate contact time as indicated for disinfecting solutions.    [name], MD [place]

## 2020-07-20 NOTE — Patient Instructions (Signed)
Nice to see you. Please continue with vitamin D supplementation.  We will let you know if we need to increase this. Please check to make sure you are getting 1200 mg of calcium through your diet or through the supplement. We will contact you once we are able to get the [place] approved.

## 2020-07-20 NOTE — Assessment & Plan Note (Signed)
Patient meets criteria for [place] to treat her osteoporosis.  We will see about getting this approved.  We will check a vitamin D level to ensure that this is normal.  I did discuss the risk of body and joint aches with this medication.  Also discussed the risk of osteonecrosis of the jaw advised to let us know if she develops jaw discomfort and to let her dentist know that she is taking this medication.

## 2020-07-21 LAB — VITAMIN D 25 HYDROXY (VIT D DEFICIENCY, FRACTURES): VITD: 29.34 ng/mL — ABNORMAL LOW (ref 30.00–100.00)

## 2020-07-22 ENCOUNTER — Other Ambulatory Visit: Payer: Self-pay | Admitting: Family Medicine

## 2020-07-22 DIAGNOSIS — E559 Vitamin D deficiency, unspecified: Secondary | ICD-10-CM

## 2020-08-08 ENCOUNTER — Other Ambulatory Visit: Payer: Self-pay | Admitting: Family Medicine

## 2020-08-08 NOTE — Telephone Encounter (Signed)
Refilled: 07/10/2020 Last OV: 07/20/2020 Next OV: 10/16/2020

## 2020-08-18 ENCOUNTER — Other Ambulatory Visit: Payer: Self-pay

## 2020-08-18 ENCOUNTER — Other Ambulatory Visit (INDEPENDENT_AMBULATORY_CARE_PROVIDER_SITE_OTHER): Payer: Medicare Other

## 2020-08-18 DIAGNOSIS — E559 Vitamin D deficiency, unspecified: Secondary | ICD-10-CM | POA: Diagnosis not present

## 2020-08-18 LAB — VITAMIN D 25 HYDROXY (VIT D DEFICIENCY, FRACTURES): VITD: 38.39 ng/mL (ref 30.00–100.00)

## 2020-08-24 ENCOUNTER — Ambulatory Visit (INDEPENDENT_AMBULATORY_CARE_PROVIDER_SITE_OTHER): Payer: Medicare Other

## 2020-08-24 VITALS — Ht 62.0 in | Wt 149.0 lb

## 2020-08-24 DIAGNOSIS — Z0000 Encounter for general adult medical examination without abnormal findings: Secondary | ICD-10-CM

## 2020-08-24 NOTE — Patient Instructions (Addendum)
  Ms. Voeller , Thank you for taking time to come for your Medicare Wellness Visit. I appreciate your ongoing commitment to your health goals. Please review the following plan we discussed and let me know if I can assist you in the future.   These are the goals we discussed:  Goals       Patient Stated     DIET - INCREASE WATER INTAKE (pt-stated)      Weight less than 140lb      Other     Decrease risk of falls      Stay active  Pace self        This is a list of the screening recommended for you and due dates:  Health Maintenance  Topic Date Due   COVID-19 Vaccine (4 - Booster for Pfizer series) 09/09/2020*   Flu Shot  10/10/2020*   Zoster (Shingles) Vaccine (1 of 2) 11/24/2020*   Mammogram  11/08/2020   Colon Cancer Screening  02/17/2028   Tetanus Vaccine  04/22/2030   DEXA scan (bone density measurement)  Completed   Hepatitis C Screening: USPSTF Recommendation to screen - Ages 50-79 yo.  Completed   Pneumonia vaccines  Completed   HPV Vaccine  Aged Out  *Topic was postponed. The date shown is not the original due date.

## 2020-08-24 NOTE — Progress Notes (Signed)
Subjective:   Jenny Roberts is a 75 y.o. female who presents for Medicare Annual (Subsequent) preventive examination.  Review of Systems    No ROS.  Medicare Wellness Virtual Visit.  Visual/audio telehealth visit, UTA vital signs.   See social history for additional risk factors.   Cardiac Risk Factors include: advanced age (>63mn, >>18women);hypertension     Objective:    Today's Vitals   08/24/20 1034  Weight: 149 lb (67.6 kg)  Height: '5\' 2"'  (1.575 m)   Body mass index is 27.25 kg/m.  Advanced Directives 08/24/2020 08/24/2019 08/21/2018 02/16/2018 08/18/2017 08/16/2016 09/17/2015  Does Patient Have a Medical Advance Directive? No No No No No No No  Does patient want to make changes to medical advance directive? - - - - No - Patient declined - -  Would patient like information on creating a medical advance directive? No - Patient declined No - Patient declined No - Patient declined - - Yes (MAU/Ambulatory/Procedural Areas - Information given) Yes - Educational materials given    Current Medications (verified) Outpatient Encounter Medications as of 08/24/2020  Medication Sig   acetaminophen (TYLENOL) 500 MG tablet Take by mouth.   aspirin 325 MG EC tablet Take 325 mg by mouth daily.   benzonatate (TESSALON) 200 MG capsule Take 1 capsule (200 mg total) by mouth 2 (two) times daily as needed for cough.   cholecalciferol (VITAMIN D) 1000 units tablet Take 1,000 Units by mouth daily.   clonazePAM (KLONOPIN) 0.5 MG tablet TAKE 1 TABLET(0.5 MG) BY MOUTH DAILY   famotidine (PEPCID) 20 MG tablet Take 20 mg by mouth 3 times/day as needed-between meals & bedtime for heartburn or indigestion.   fluticasone (FLONASE) 50 MCG/ACT nasal spray Place 2 sprays into both nostrils daily. (Patient taking differently: Place 2 sprays into both nostrils daily as needed for allergies.)   ibuprofen (ADVIL,MOTRIN) 200 MG tablet Take by mouth.   levothyroxine (SYNTHROID) 75 MCG tablet TAKE 1 TABLET(75 MCG) BY  MOUTH DAILY   lovastatin (MEVACOR) 40 MG tablet TAKE 1 TABLET(40 MG) BY MOUTH DAILY   naproxen (NAPROSYN) 500 MG tablet    sertraline (ZOLOFT) 100 MG tablet TAKE 1 TABLET BY MOUTH DAILY   No facility-administered encounter medications on file as of 08/24/2020.    Allergies (verified) Amoxicillin-pot clavulanate, Codeine, and Latex   History: Past Medical History:  Diagnosis Date   Anxiety    [place]   [place]. 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  Focal positive posterior margin.  0/5 lymph nodes.   CARPAL TUNNEL RELEASE  2016   COLONOSCOPY  2008   COLONOSCOPY WITH PROPOFOL N/A 02/16/2018   Procedure: COLONOSCOPY WITH PROPOFOL;  Surgeon: AJonathon Bellows MD;  [facility];  Service: Endoscopy;  Laterality: N/A;   KNEE SURGERY  2003   mole excision  2006   Basal cell    WRIST SURGERY Right 2010   Family History  Problem Relation Age of Onset   Brain cancer Father    Cancer Maternal Grandfather 78       Colon Cancer   Breast cancer Neg Hx    Social History   Socioeconomic History   Marital status: Married    Spouse name: Not on file   Number of children: Not on file   Years of education: Not on file   Highest education level: Not on file  Occupational History   Not on file  Tobacco Use   Smoking status: Never   Smokeless tobacco: Never  Vaping Use   Vaping Use:  Never used  Substance and Sexual Activity   Alcohol use: No    Alcohol/week: 0.0 standard drinks   Drug use: No   Sexual activity: Not Currently  Other Topics Concern   Not on file  Social History Narrative   She is a homemaker and babysits   Children- 2 daughters    8 grandchildren, 3 great grandchildren    Pets: None   Caffeine- Decaf coffee, rare Dr. Malachi Bonds    Enjoys- gardening, cross stitch, and quilt   Social Determinants of Health   Financial Resource Strain: Low Risk    Difficulty of Paying Living Expenses: Not hard at all  Food Insecurity: No Food Insecurity   Worried About Charity fundraiser in the Last Year: Never true   [place] in the Last Year: Never true  Transportation Needs: No Transportation Needs   Lack of Transportation (Medical): No   Lack of Transportation (Non-Medical): No  Physical Activity: Not on file  Stress: No Stress Concern Present   Feeling of Stress : Not at all  Social Connections: Unknown   Frequency of Communication with Friends and Family: More than three times a week   Frequency of Social Gatherings with Friends and Family: More than three times a week   Attends Religious Services: Not on Electrical engineer or Organizations: Not on file   Attends Archivist Meetings: Not on file   Marital Status: Married    Tobacco Counseling Counseling given: Not Answered   Clinical Intake:  Pre-visit preparation completed: Yes        Diabetes: No  How often do you need to have someone help you when you read instructions, pamphlets, or other written materials from your doctor or pharmacy?: 1 - Never Interpreter Needed?: No      Activities of Daily Living In your present state of health, do you have any difficulty performing the following activities: 08/24/2020  Hearing? N  Vision? N  Difficulty concentrating or making decisions? N  Walking or climbing stairs? N  Dressing or bathing? N  Doing errands,  shopping? N  Preparing Food and eating ? N  Using the Toilet? N  In the past six months, have you accidently leaked urine? N  Do you have problems with loss of bowel control? N  Managing your Medications? N  Managing your Finances? N  Housekeeping or managing your Housekeeping? N  Some recent data might be hidden    Patient Care Team: Leone Haven, MD as PCP - General (Family Medicine) Christene Lye, MD (General Surgery)  Indicate any recent Medical Services you may have received from other than [facility] providers in the past year (date may be approximate).     Assessment:  This is a routine wellness examination for [place].  I connected with Armilda today by telephone and verified that I am speaking with the correct person using two identifiers. Location patient: home Location provider: work Persons participating in the virtual visit: patient, Marine scientist.    I discussed the limitations, risks, security and privacy concerns of performing an evaluation and management service by telephone and the availability of in person appointments. The patient expressed understanding and verbally consented to this telephonic visit.    Interactive audio and video telecommunications were attempted between this provider and patient, however failed, due to patient having technical difficulties OR patient did not have access to video capability.  We continued and completed visit with audio only.  Some vital signs may be absent or patient reported.   Hearing/Vision screen Hearing Screening - Comments:: Patient is able to hear conversational tones without difficulty.  No issues reported. Vision Screening - Comments:: Followed by Dr. Gloriann Loan  Wears corrective lenses  They have seen their ophthalmologist in the last 12 months.   Dietary issues and exercise activities discussed: Current Exercise Habits: Home exercise routine, Intensity: Mild Regular diet Good water intake   Goals Addressed              This Visit's Progress    Decrease risk of falls       Stay active  Pace self       Depression Screen PHQ 2/9 Scores 08/24/2020 05/22/2020 11/23/2019 08/24/2019 05/18/2019 09/22/2018 08/21/2018  PHQ - 2 Score 0 0 0 0 0 0 0    Fall Risk Fall Risk  05/22/2020 11/23/2019 08/24/2019 05/18/2019 09/22/2018  Falls in the past year? 1 0 0 0 0  Number falls in past yr: 0 0 0 0 0  Injury with Fall? 1 - - - 0  Follow up Falls evaluation completed Falls evaluation completed Falls evaluation completed Falls evaluation completed Falls evaluation completed    [place]: Adequate lighting in your home to reduce risk of falls? Yes   ASSISTIVE DEVICES UTILIZED TO PREVENT FALLS: Use of a cane, walker or w/c? No   TIMED UP AND GO: Was the test performed? No .   Cognitive Function: MMSE - Mini Mental State Exam 08/18/2017 08/16/2016 07/10/2015  Orientation to time '5 5 5  ' Orientation to Place '5 5 5  ' Registration '3 3 3  ' Attention/ Calculation '5 5 5  ' Recall '3 3 3  ' Language- name 2 objects '2 2 2  ' Language- repeat '1 1 1  ' Language- follow 3 step command '3 3 3  ' Language- read & follow direction '1 1 1  ' Write a sentence '1 1 1  ' Copy design '1 1 1  ' Total score '30 30 30     ' 6CIT Screen 08/24/2020 08/21/2018  What Year? 0 points 0 points  What month? 0 points 0 points  What time? 0 points 0 points  Count back from 20 0 points 0 points  Months in reverse 0 points 0 points  Repeat phrase 0 points 0 points  Total Score 0 0    Immunizations Immunization History  Administered Date(s) Administered   Fluad Quad(high Dose 65+) 09/22/2018, 11/23/2019   Influenza, High Dose Seasonal PF 10/06/2015, 02/28/2017, 12/26/2017   Influenza-Unspecified 11/07/2014   PFIZER(Purple Top)SARS-COV-2 Vaccination 02/27/2019, 03/20/2019, 11/01/2019   Pneumococcal Conjugate-13 07/10/2015   Pneumococcal Polysaccharide-23 08/16/2016   Tdap 04/21/2020    TDAP status: Due, Education has been provided  regarding the importance of this vaccine.  Advised may receive this vaccine at local pharmacy or Health Dept. Aware to provide a copy of the vaccination record if obtained from local pharmacy or Health Dept. Verbalized acceptance and understanding. Deferred.   Health Maintenance Health Maintenance  Topic Date Due   COVID-19 Vaccine (4 - Booster for [place] series) 09/09/2020 (Originally 03/03/2020)   INFLUENZA VACCINE  10/10/2020 (Originally 08/21/2020)   Zoster Vaccines- Shingrix (1 of 2) 11/24/2020 (Originally 04/09/1995)   MAMMOGRAM  11/08/2020   COLONOSCOPY (Pts 45-33yr Insurance coverage will need to be confirmed)  02/17/2028   TETANUS/TDAP  04/22/2030   DEXA SCAN  Completed   Hepatitis C Screening  Completed   PNA vac Low Risk Adult  Completed   HPV VACCINES  Aged Out   Mammogram status: completed 11/08/20. Ordered per consent.   Lung Cancer Screening: (Low Dose CT Chest recommended if Age 75-80years, 30 pack-year currently smoking OR have quit w/in 15years.) does not qualify.   Vision Screening: Recommended annual ophthalmology exams for early detection of glaucoma and other disorders of the eye.  Dental Screening: Recommended annual dental exams for proper oral hygiene  Community Resource Referral / Chronic Care Management: CRR required this visit?  No   CCM required this visit?  No      Plan:   Keep all routine maintenance appointments.   I have personally reviewed and noted the following in the patient's chart:   Medical and social history Use of alcohol, tobacco or illicit drugs  Current medications and supplements including opioid prescriptions. Patient is not currently taking opioid.  Functional ability and status Nutritional status Physical activity Advanced directives List of other physicians Hospitalizations, surgeries, and ER visits in previous 12 months Vitals Screenings to include cognitive, depression, and falls Referrals and appointments  In addition,  I have reviewed and discussed with patient certain preventive protocols, quality metrics, and best practice recommendations. A written personalized care plan for preventive services as well as general preventive health recommendations were provided to patient via mail.     OVarney Biles LPN   [id number]

## 2020-09-08 ENCOUNTER — Telehealth: Payer: Self-pay | Admitting: *Deleted

## 2020-09-08 NOTE — Telephone Encounter (Signed)
-----   Message from Leone Haven, MD sent at 08/20/2020  3:33 PM EDT ----- Please let the patient know that her vitamin D level is now acceptable.  She should continue the vitamin D 4000 international units once daily.  I will forward to Juliann Pulse to see if we can get her started on the [place] as discussed previously.  Juliann Pulse, can we see about approval for Evenity?

## 2020-09-08 NOTE — Telephone Encounter (Signed)
Verification for Eventity has been submitted.

## 2020-09-12 NOTE — Telephone Encounter (Signed)
Benefit still in process.

## 2020-09-20 NOTE — Telephone Encounter (Signed)
Benefit still in process

## 2020-10-04 ENCOUNTER — Other Ambulatory Visit: Payer: Self-pay | Admitting: Family Medicine

## 2020-10-04 DIAGNOSIS — Z1231 Encounter for screening mammogram for malignant neoplasm of breast: Secondary | ICD-10-CM

## 2020-10-09 ENCOUNTER — Other Ambulatory Visit: Payer: Self-pay | Admitting: Family Medicine

## 2020-10-13 NOTE — Telephone Encounter (Signed)
Jeda Wince [place]: [id number]

## 2020-10-16 ENCOUNTER — Encounter: Payer: Self-pay | Admitting: Family Medicine

## 2020-10-16 ENCOUNTER — Other Ambulatory Visit: Payer: Self-pay

## 2020-10-16 ENCOUNTER — Ambulatory Visit (INDEPENDENT_AMBULATORY_CARE_PROVIDER_SITE_OTHER): Payer: Medicare Other | Admitting: Family Medicine

## 2020-10-16 DIAGNOSIS — E039 Hypothyroidism, unspecified: Secondary | ICD-10-CM

## 2020-10-16 DIAGNOSIS — G479 Sleep disorder, unspecified: Secondary | ICD-10-CM | POA: Diagnosis not present

## 2020-10-16 DIAGNOSIS — F411 Generalized anxiety disorder: Secondary | ICD-10-CM | POA: Diagnosis not present

## 2020-10-16 DIAGNOSIS — M810 Age-related osteoporosis without current pathological fracture: Secondary | ICD-10-CM

## 2020-10-16 DIAGNOSIS — Z20822 Contact with and (suspected) exposure to covid-19: Secondary | ICD-10-CM | POA: Insufficient documentation

## 2020-10-16 DIAGNOSIS — E559 Vitamin D deficiency, unspecified: Secondary | ICD-10-CM

## 2020-10-16 NOTE — Assessment & Plan Note (Signed)
TSH has been acceptable.  She will continue Synthroid 75 mcg once daily.  Plan for labs at her appointment in November.

## 2020-10-16 NOTE — Assessment & Plan Note (Signed)
Stable.  She will continue Zoloft 100 mg once daily.  She can continue clonazepam 0.5 mg by mouth nightly before bed.  I did discuss the potential for switching to something else to help with sleep though advised we would have to discontinue the clonazepam if we did that.

## 2020-10-16 NOTE — Progress Notes (Signed)
Virtual Visit via telephone Note  This visit type was conducted due to national recommendations for restrictions regarding the COVID-19 pandemic (e.g. social distancing).  This format is felt to be most appropriate for this patient at this time.  All issues noted in this document were discussed and addressed.  No physical exam was performed (except for noted visual exam findings with Video Visits).   I connected with Jenny Roberts today at  9:00 AM EDT by telephone and verified that I am speaking with the correct person using two identifiers. Location patient: home Location provider: work Persons participating in the virtual visit: patient, provider  I discussed the limitations, risks, security and privacy concerns of performing an evaluation and management service by telephone and the availability of in person appointments. I also discussed with the patient that there may be a patient responsible charge related to this service. The patient expressed understanding and agreed to proceed.  Interactive audio and video telecommunications were attempted between this provider and patient, however failed, due to patient having technical difficulties OR patient did not have access to video capability.  We continued and completed visit with audio only.   Reason for visit: f/u.  HPI: HYPOTHYROIDISM Disease Monitoring Skin Changes: no Palpitations: no Heat/Cold intolerance: no  Medication Monitoring Compliance:  taking synthroid   Last TSH:   Lab Results  Component Value Date   TSH 1.37 05/22/2020   Anxiety: Generally well controlled.  Occasionally has some anxiety related to family issues.  She is learning to let go of those things.  She is on Zoloft and clonazepam.  She takes both of those nightly.  She does have some issues sleeping.  No trouble falling asleep though does wake up 3-4 times at night.  She watches the news the hour before bed.  No alcohol or caffeine intake.  No  depression.  COVID exposure: Patient notes her grandchildren tested positive for COVID on 10/11/2020.  She was last exposed to them on 10/09/2020.  The patient has had no symptoms.  She had a negative COVID test yesterday.  Osteoporosis: She has been on 4000 international units of vitamin D.  We have been trying to get her approved for Evenity.   ROS: See pertinent positives and negatives per HPI.  Past Medical History:  Diagnosis Date   Anxiety    Breast cancer ([place]) 2001   Right. 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  Focal positive posterior margin.  0/5 lymph nodes.   CARPAL TUNNEL RELEASE  2016   COLONOSCOPY  2008   COLONOSCOPY WITH PROPOFOL N/A 02/16/2018   Procedure: COLONOSCOPY WITH PROPOFOL;  Surgeon: Jonathon Bellows, MD;  [facility];  Service: Endoscopy;  Laterality: N/A;   KNEE SURGERY  2003   mole excision  2006   Basal cell    WRIST  SURGERY Right 2010    Family History  Problem Relation Age of Onset   Brain cancer Father    Cancer Maternal Grandfather 33       Colon Cancer   Breast cancer Neg Hx     SOCIAL HX: Non-smoker   Current Outpatient Medications:    acetaminophen (TYLENOL) 500 MG tablet, Take by mouth., Disp: , Rfl:    aspirin 325 MG EC tablet, Take 325 mg by mouth daily., Disp: , Rfl:    benzonatate (TESSALON) 200 MG capsule, Take 1 capsule (200 mg total) by  mouth 2 (two) times daily as needed for cough., Disp: 20 capsule, Rfl: 0   cholecalciferol (VITAMIN D) 1000 units tablet, Take 1,000 Units by mouth daily., Disp: , Rfl:    clonazePAM (KLONOPIN) 0.5 MG tablet, TAKE 1 TABLET(0.5 MG) BY MOUTH DAILY, Disp: 30 tablet, Rfl: 0   famotidine (PEPCID) 20 MG tablet, Take 20 mg by mouth 3 times/day as needed-between meals & bedtime for heartburn or indigestion., Disp: , Rfl:    fluticasone (FLONASE) 50 MCG/ACT nasal spray, Place 2 sprays into both nostrils daily. (Patient taking differently: Place 2 sprays into both nostrils daily as needed for allergies.), Disp: 16 g, Rfl: 6   ibuprofen (ADVIL,MOTRIN) 200 MG tablet, Take by mouth., Disp: , Rfl:    levothyroxine (SYNTHROID) 75 MCG tablet, TAKE 1 TABLET(75 MCG) BY MOUTH DAILY, Disp: 90 tablet, Rfl: 1   lovastatin (MEVACOR) 40 MG tablet, TAKE 1 TABLET(40 MG) BY MOUTH DAILY, Disp: 90 tablet, Rfl: 3   naproxen (NAPROSYN) 500 MG tablet, , Disp: , Rfl:    sertraline (ZOLOFT) 100 MG tablet, TAKE 1 TABLET BY MOUTH DAILY, Disp: 90 tablet, Rfl: 1  EXAM: This is a telephone visit and thus no exam was completed.  ASSESSMENT AND PLAN:  Discussed the following assessment and plan:  Problem List Items Addressed This Visit     Anxiety state    Stable.  She will continue Zoloft 100 mg once daily.  She can continue clonazepam 0.5 mg by mouth nightly before bed.  I did discuss the potential for switching to something else to help with sleep though advised we would have to discontinue the clonazepam if we did that.      Close exposure to COVID-19 virus    The patient tested at an appropriate interval.  Her test was negative.  Discussed that she wear a mask for 10 days after her exposure.  Advised she should retest and quarantine if she develops any symptoms.      Hypothyroidism    TSH has been acceptable.  She will continue Synthroid 75 mcg once daily.  Plan for labs at her appointment in November.      Osteoporosis     I cannot quite tell if she has been approved for her Evenity.  I will check with our on the approval process.  She will continue vitamin D 4000 international units daily.      Sleeping difficulty    Possibly anxiety related or related to watching TV before bed.  She will cut out screen time the hour before bed.  We will continue her current anxiety medications.  She will monitor.      Vitamin D deficiency    Much improved on recent labs.  She will continue  vitamin D 4000 international units daily.       Return for As scheduled in November.   I discussed the assessment and treatment plan with the patient.  The patient was provided an opportunity to ask questions and all were answered. The patient agreed with the plan and demonstrated an understanding of the instructions.   The patient was advised to call back or seek an in-person evaluation if the symptoms worsen or if the condition fails to improve as anticipated.  I provided 12 minutes of non-face-to-face time during this encounter.   [name], MD

## 2020-10-16 NOTE — Assessment & Plan Note (Signed)
Possibly anxiety related or related to watching TV before bed.  She will cut out screen time the hour before bed.  We will continue her current anxiety medications.  She will monitor.

## 2020-10-16 NOTE — Assessment & Plan Note (Addendum)
Much improved on recent labs.  She will continue  vitamin D 4000 international units daily.

## 2020-10-16 NOTE — Telephone Encounter (Signed)
Thank you. Can you let the patient know that we are still working on this as she asked during her visit today?

## 2020-10-16 NOTE — Assessment & Plan Note (Signed)
I cannot quite tell if she has been approved for her Evenity.  I will check with our on the approval process.  She will continue vitamin D 4000 international units daily.

## 2020-10-16 NOTE — Telephone Encounter (Signed)
The Prolia was approved by insurance would this be an alternative we can use for her?

## 2020-10-16 NOTE — Assessment & Plan Note (Signed)
The patient tested at an appropriate interval.  Her test was negative.  Discussed that she wear a mask for 10 days after her exposure.  Advised she should retest and quarantine if she develops any symptoms.

## 2020-10-16 NOTE — Telephone Encounter (Signed)
[employer] has denied my first appeal for Eventity and My second appeal , I am trying to get Prolia approved at this time. I will complete a third appeal entity as well.

## 2020-10-16 NOTE — Telephone Encounter (Signed)
As discussed with PCP spoke with patient she is agreeable to starting prolia once she discusses with PCP on 11/22/2020 . I have ordered prolia can be given at appointment .

## 2020-10-20 ENCOUNTER — Ambulatory Visit: Payer: Medicare Other | Admitting: Family Medicine

## 2020-11-06 ENCOUNTER — Other Ambulatory Visit: Payer: Self-pay | Admitting: Family Medicine

## 2020-11-22 ENCOUNTER — Ambulatory Visit: Payer: Medicare Other | Admitting: Family Medicine

## 2020-11-30 ENCOUNTER — Ambulatory Visit
Admission: RE | Admit: 2020-11-30 | Discharge: 2020-11-30 | Disposition: A | Discharge status: Home / Self Care | Payer: Medicare Other | Source: Ambulatory Visit | Attending: Family Medicine | Admitting: Family Medicine

## 2020-11-30 ENCOUNTER — Other Ambulatory Visit: Payer: Self-pay

## 2020-11-30 DIAGNOSIS — Z1231 Encounter for screening mammogram for malignant neoplasm of breast: Secondary | ICD-10-CM | POA: Diagnosis not present

## 2020-12-05 ENCOUNTER — Other Ambulatory Visit: Payer: Self-pay | Admitting: Family Medicine

## 2020-12-05 ENCOUNTER — Other Ambulatory Visit: Payer: Self-pay | Admitting: Internal Medicine

## 2020-12-05 DIAGNOSIS — E039 Hypothyroidism, unspecified: Secondary | ICD-10-CM

## 2020-12-11 ENCOUNTER — Other Ambulatory Visit: Payer: Self-pay | Admitting: Family Medicine

## 2020-12-12 NOTE — Telephone Encounter (Signed)
RX Refill: klonopin Last Seen: 10-16-20 Last Ordered: 11-10-20 Next Appt: 01-09-21

## 2021-01-06 ENCOUNTER — Other Ambulatory Visit: Payer: Self-pay | Admitting: Family Medicine

## 2021-01-08 NOTE — Telephone Encounter (Signed)
RX Refill: Klonopin Last Seen: 10-16-20 Last Ordered: 12-12-20 Next Appt: 01-09-21

## 2021-01-09 ENCOUNTER — Encounter: Payer: Self-pay | Admitting: Family Medicine

## 2021-01-09 ENCOUNTER — Other Ambulatory Visit: Payer: Self-pay

## 2021-01-09 ENCOUNTER — Ambulatory Visit (INDEPENDENT_AMBULATORY_CARE_PROVIDER_SITE_OTHER): Payer: Medicare Other | Admitting: Family Medicine

## 2021-01-09 VITALS — BP 120/78 | HR 72 | Temp 98.1°F | Ht 62.0 in | Wt 148.2 lb

## 2021-01-09 DIAGNOSIS — M810 Age-related osteoporosis without current pathological fracture: Secondary | ICD-10-CM | POA: Diagnosis not present

## 2021-01-09 DIAGNOSIS — Z23 Encounter for immunization: Secondary | ICD-10-CM | POA: Diagnosis not present

## 2021-01-09 DIAGNOSIS — E039 Hypothyroidism, unspecified: Secondary | ICD-10-CM

## 2021-01-09 LAB — BASIC METABOLIC PANEL
BUN: 21 mg/dL (ref 6–23)
CO2: 27 mEq/L (ref 19–32)
Calcium: 9.5 mg/dL (ref 8.4–10.5)
Chloride: 106 mEq/L (ref 96–112)
Creatinine, Ser: 0.75 mg/dL (ref 0.40–1.20)
GFR: 77.7 mL/min (ref >60.00)
Glucose, Bld: 91 mg/dL (ref 70–99)
Potassium: 4.3 mEq/L (ref 3.5–5.1)
Sodium: 140 mEq/L (ref 135–145)

## 2021-01-09 LAB — VITAMIN D 25 HYDROXY (VIT D DEFICIENCY, FRACTURES): VITD: 51.8 ng/mL (ref 30.00–100.00)

## 2021-01-09 NOTE — Assessment & Plan Note (Signed)
We discussed starting Prolia.  Discussed the risk of joint and body aches.  Discussed the risk of osteonecrosis of the jaw.  Advised that I have not seen this occur though there is some risk of this with this medication.  Discussed the risk seems to be more elevated when there is a dental procedure completed.  The patient is willing to start Prolia.  It has been sometime since her calcium and vitamin D have been checked and thus we will check these today.  She will be scheduled for her Prolia in 1 week.

## 2021-01-09 NOTE — Progress Notes (Signed)
Jenny Rumps, MD Phone: [phone]  Jenny Roberts is a 75 y.o. female who presents today for f/u.  Osteoporosis: Patient is due to start Prolia today.  She is on vitamin D supplementation.  Her vitamin D has been in the normal range.  She is no longer taking a calcium supplement.  HYPOTHYROIDISM Disease Monitoring Weight changes: no  Skin Changes: no Heat/Cold intolerance: no  Medication Monitoring Compliance:  taking synthroid   Last TSH:   Lab Results  Component Value Date   TSH 1.37 05/22/2020     Social History   Tobacco Use  Smoking Status Never  Smokeless Tobacco Never    Current Outpatient Medications on File Prior to Visit  Medication Sig Dispense Refill   acetaminophen (TYLENOL) 500 MG tablet Take by mouth.     aspirin 325 MG EC tablet Take 325 mg by mouth daily.     benzonatate (TESSALON) 200 MG capsule Take 1 capsule (200 mg total) by mouth 2 (two) times daily as needed for cough. 20 capsule 0   cholecalciferol (VITAMIN D) 1000 units tablet Take 1,000 Units by mouth daily.     clonazePAM (KLONOPIN) 0.5 MG tablet TAKE 1 TABLET(0.5 MG) BY MOUTH DAILY 30 tablet 0   famotidine (PEPCID) 20 MG tablet Take 20 mg by mouth 3 times/day as needed-between meals & bedtime for heartburn or indigestion.     fluticasone (FLONASE) 50 MCG/ACT nasal spray Place 2 sprays into both nostrils daily. (Patient taking differently: Place 2 sprays into both nostrils daily as needed for allergies.) 16 g 6   ibuprofen (ADVIL,MOTRIN) 200 MG tablet Take by mouth.     levothyroxine (SYNTHROID) 75 MCG tablet TAKE 1 TABLET(75 MCG) BY MOUTH DAILY 90 tablet 1   lovastatin (MEVACOR) 40 MG tablet TAKE 1 TABLET(40 MG) BY MOUTH DAILY 90 tablet 3   naproxen (NAPROSYN) 500 MG tablet      sertraline (ZOLOFT) 100 MG tablet TAKE 1 TABLET BY MOUTH DAILY 90 tablet 1   No current facility-administered medications on file prior to visit.     ROS see history of present illness  Objective  Physical  Exam Vitals:   01/09/21 1209  BP: 120/78  Pulse: 72  Temp: 98.1 F (36.7 C)  SpO2: 97%    BP Readings from Last 3 Encounters:  01/09/21 120/78  07/20/20 120/80  05/22/20 125/70   Wt Readings from Last 3 Encounters:  01/09/21 148 lb 3.2 oz (67.2 kg)  10/16/20 147 lb (66.7 kg)  08/24/20 149 lb (67.6 kg)    Physical Exam Constitutional:      General: She is not in acute distress.    Appearance: She is not diaphoretic.  Cardiovascular:     Rate and Rhythm: Normal rate and regular rhythm.     Heart sounds: Normal heart sounds.  Pulmonary:     Effort: Pulmonary effort is normal.     Breath sounds: Normal breath sounds.  Skin:    General: Skin is warm and dry.  Neurological:     Mental Status: She is alert.     Assessment/Plan: Please see individual problem list.  Problem List Items Addressed This Visit     Hypothyroidism    Most recent TSH acceptable. She will continue synthroid 75 mcg daily. Plan to check her TSH in 6 months.       Osteoporosis - Primary    We discussed starting Prolia.  Discussed the risk of joint and body aches.  Discussed the risk of osteonecrosis  of the jaw.  Advised that I have not seen this occur though there is some risk of this with this medication.  Discussed the risk seems to be more elevated when there is a dental procedure completed.  The patient is willing to start Prolia.  It has been sometime since her calcium and vitamin D have been checked and thus we will check these today.  She will be scheduled for her Prolia in 1 week.      Relevant Orders   Basic Metabolic Panel (BMET)   Vitamin D (25 hydroxy)     Health Maintenance: flu vaccine given today.   Return in about 1 week (around 01/16/2021) for Prolia injection with nursing, 6.5 months with PCP and for Prolia.  This visit occurred during the SARS-CoV-2 public health emergency.  Safety protocols were in place, including screening questions prior to the visit, additional usage of  staff PPE, and extensive cleaning of exam room while observing appropriate contact time as indicated for disinfecting solutions.    Jenny Rumps, MD [place]

## 2021-01-09 NOTE — Patient Instructions (Addendum)
Nice to see you. We are going to get some labs today and then we will do your Prolia injection next week.  Hypothyroidism: Taking Synthroid.  No skin changes.  No heat or cold intolerance.  Hypothyroidism: She is taking Synthroid.  No skin changes.  No heat or cold intolerance.  Hypothyroidism flu vaccine given today.  Flu injection given today.

## 2021-01-09 NOTE — Assessment & Plan Note (Signed)
Most recent TSH acceptable. She will continue synthroid 75 mcg daily. Plan to check her TSH in 6 months.

## 2021-01-16 ENCOUNTER — Ambulatory Visit (INDEPENDENT_AMBULATORY_CARE_PROVIDER_SITE_OTHER): Payer: Medicare Other

## 2021-01-16 ENCOUNTER — Other Ambulatory Visit: Payer: Self-pay

## 2021-01-16 DIAGNOSIS — M810 Age-related osteoporosis without current pathological fracture: Secondary | ICD-10-CM | POA: Diagnosis not present

## 2021-01-16 MED ORDER — DENOSUMAB 60 MG/ML ~~LOC~~ SOSY
60.0000 mg | PREFILLED_SYRINGE | Freq: Once | SUBCUTANEOUS | Status: AC
Start: 2021-01-16 — End: 2021-01-16
  Administered 2021-01-16: 10:00:00 60 mg via SUBCUTANEOUS

## 2021-01-16 NOTE — Progress Notes (Signed)
Patient came in today for Prolia injection given in left arm SQ. Patient tolerated well with no sign of distress.

## 2021-02-06 ENCOUNTER — Other Ambulatory Visit: Payer: Self-pay | Admitting: Family Medicine

## 2021-02-07 NOTE — Telephone Encounter (Signed)
Refilled: 01/08/2021 Last OV: 01/09/2021 Next OV: 07/10/2021

## 2021-02-15 ENCOUNTER — Encounter: Payer: Self-pay | Admitting: Internal Medicine

## 2021-02-15 ENCOUNTER — Telehealth: Payer: Self-pay | Admitting: Internal Medicine

## 2021-02-15 ENCOUNTER — Ambulatory Visit (INDEPENDENT_AMBULATORY_CARE_PROVIDER_SITE_OTHER): Payer: Medicare Other | Admitting: Internal Medicine

## 2021-02-15 ENCOUNTER — Other Ambulatory Visit: Payer: Self-pay

## 2021-02-15 VITALS — BP 140/80 | HR 91 | Temp 98.1°F | Ht 62.0 in | Wt 147.8 lb

## 2021-02-15 DIAGNOSIS — Z853 Personal history of malignant neoplasm of breast: Secondary | ICD-10-CM

## 2021-02-15 DIAGNOSIS — Z87898 Personal history of other specified conditions: Secondary | ICD-10-CM

## 2021-02-15 NOTE — Addendum Note (Signed)
Addended by: Orland Mustard on: 02/15/2021 03:13 PM   Modules accepted: Orders

## 2021-02-15 NOTE — Patient Instructions (Signed)
Breast Tenderness Breast tenderness is a common problem for women of all ages, but may also occur in men. Breast tenderness may range from mild discomfort to severe pain. In women, the pain usually comes and goes with the menstrual cycle, but it can also be constant. Breast tenderness has many possible causes, including hormone changes, infections, and taking certain medicines. You may have tests, such as a mammogram or an ultrasound, to check for any unusual findings. Having breast tenderness usually does not mean that you have breast cancer. Follow these instructions at home: Managing pain and discomfort  If directed, put ice to the painful area. To do this: Put ice in a plastic bag. Place a towel between your skin and the bag. Leave the ice on for 20 minutes, 2-3 times a day. Wear a supportive bra, especially during exercise. You may also want to wear a supportive bra while sleeping if your breasts are very tender. Medicines Take over-the-counter and prescription medicines only as told by your health care provider. If the cause of your pain is infection, you may be prescribed an antibiotic medicine. If you were prescribed an antibiotic, take it as told by your health care provider. Do not stop taking the antibiotic even if you start to feel better. Eating and drinking Your health care provider may recommend that you lessen the amount of fat in your diet. You can do this by: Limiting fried foods. Cooking foods using methods such as baking, boiling, grilling, and broiling. Decrease the amount of caffeine in your diet. Instead, drink more water and choose caffeine-free drinks. General instructions  Keep a log of the days and times when your breasts are most tender. Ask your health care provider how to do breast exams at home. This will help you notice if you have an unusual growth or lump. Keep all follow-up visits as told by your health care provider. This is important. Contact a health care  provider if: Any part of your breast is hard, red, and hot to the touch. This may be a sign of infection. You are a woman and: Not breastfeeding and you have fluid, especially blood or pus, coming out of your nipples. Have a new or painful lump in your breast that remains after your menstrual period ends. You have a fever. Your pain does not improve or it gets worse. Your pain is interfering with your daily activities. Summary Breast tenderness may range from mild discomfort to severe pain. Breast tenderness has many possible causes, including hormone changes, infections, and taking certain medicines. It can be treated with ice, wearing a supportive bra, and medicines. Make changes to your diet if told to by your health care provider. This information is not intended to replace advice given to you by your health care provider. Make sure you discuss any questions you have with your health care provider. Document Revised: 06/01/2018 Document Reviewed: 06/01/2018 Elsevier Patient Education  2022 Elsevier Inc.  

## 2021-02-15 NOTE — Telephone Encounter (Signed)
Called and informed Patient and informed STAT diagnostic mammogram and ultrasound of the right breast scheduled for 02/27/2021  2:20 PM at [facility].   Patient verbalized understanding.

## 2021-02-15 NOTE — Progress Notes (Signed)
Chief Complaint  Patient presents with   Breast Problem    Lump on the right breast noticed durring self breast exam on this past Sunday. Patient states no pain or visual changes. Lump at 10 o clock on the right breast.   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 Treatment included radiation and chemo therapy according to the Patient.    Acute visit  1. Right breast lump noted Sunday about the size of as pear near upper outer area or right areolar ~10 oclock h/o breast cancer with chemo, rad, lumpectomy in 2001  Review of Systems  Constitutional:  Negative for weight loss.  HENT:  Negative for hearing loss.   Eyes:  Negative for blurred vision.  Respiratory:  Negative for shortness of breath.   Cardiovascular:  Negative for chest pain.  Gastrointestinal:  Negative for abdominal pain and blood in stool.  Genitourinary:  Negative for dysuria.  Musculoskeletal:  Negative for falls and joint pain.  Skin:  Negative for rash.  Neurological:  Negative for headaches.  Psychiatric/Behavioral:  Negative for depression.   Past Medical History:  Diagnosis Date   Anxiety    Breast cancer ([place]) 2001   Right. Treated with lumpectomy followed by Chemotherapy and Radiation therapy   Depression    Hyperlipidemia    Nontraumatic rupture of tendon of thumb 06/15/2014   Personal history of chemotherapy    Personal history of radiation therapy    Right forearm fracture 05/22/2020   Skin cancer 09/2004   treated at [facility]   Thyroid disease    Past Surgical History:  Procedure Laterality Date   ABDOMINAL HYSTERECTOMY  1983   Left parts of each ovary    BREAST BIOPSY Right 2001   positive   BREAST EXCISIONAL BIOPSY Right 1990's   benign   BREAST LUMPECTOMY Right 08/16/1999   Metaplastic carcinoma, grade 3; 1.7 cm, T1c, N0; 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  Focal positive posterior margin.  0/5 lymph nodes.   CARPAL TUNNEL RELEASE  2016   COLONOSCOPY  2008    COLONOSCOPY WITH PROPOFOL N/A 02/16/2018   Procedure: COLONOSCOPY WITH PROPOFOL;  Surgeon: Jonathon Bellows, MD;  Location: [facility] ENDOSCOPY;  Service: Endoscopy;  Laterality: N/A;   KNEE SURGERY  2003   mole excision  2006   Basal cell    WRIST SURGERY Right 2010   Family History  Problem Relation Age of Onset   Brain cancer Father    Cancer Maternal Grandfather 12       Colon Cancer   Breast cancer Neg Hx    Social History   Socioeconomic History   Marital status: Married    Spouse name: Not on file   Number of children: Not on file   Years of education: Not on file   Highest education level: Not on file  Occupational History   Not on file  Tobacco Use   Smoking status: Never   Smokeless tobacco: Never  Vaping Use   Vaping Use: Never used  Substance and Sexual Activity   Alcohol use: No    Alcohol/week: 0.0 standard drinks   Drug use: No   Sexual activity: Not Currently  Other Topics Concern   Not on file  Social History Narrative   She is a homemaker and babysits   Children- 2 daughters    8 grandchildren, 3 great grandchildren    Pets: None   Caffeine- Decaf coffee, rare Dr. Malachi Bonds  Enjoys- gardening, cross stitch, and quilt   Social Determinants of Radio broadcast assistant Strain: Low Risk    Difficulty of Paying Living Expenses: Not hard at all  Food Insecurity: No Food Insecurity   Worried About Charity fundraiser in the Last Year: Never true   Arboriculturist in the Last Year: Never true  Transportation Needs: No Transportation Needs   Lack of Transportation (Medical): No   Lack of Transportation (Non-Medical): No  Physical Activity: Not on file  Stress: No Stress Concern Present   Feeling of Stress : Not at all  Social Connections: Unknown   Frequency of Communication with Friends and Family: More than three times a week   Frequency of Social Gatherings with Friends and Family: More than three times a week   Attends Religious Services: Not on Advertising copywriter or Organizations: Not on file   Attends Archivist Meetings: Not on file   Marital Status: Married  Human resources officer Violence: Not At Risk   Fear of Current or Ex-Partner: No   Emotionally Abused: No   Physically Abused: No   Sexually Abused: No   Current Meds  Medication Sig   acetaminophen (TYLENOL) 500 MG tablet Take by mouth.   aspirin 325 MG EC tablet Take 325 mg by mouth daily.   cholecalciferol (VITAMIN D) 1000 units tablet Take 1,000 Units by mouth daily.   clonazePAM (KLONOPIN) 0.5 MG tablet TAKE 1 TABLET(0.5 MG) BY MOUTH DAILY   famotidine (PEPCID) 20 MG tablet Take 20 mg by mouth 3 times/day as needed-between meals & bedtime for heartburn or indigestion.   fluticasone (FLONASE) 50 MCG/ACT nasal spray Place 2 sprays into both nostrils daily. (Patient taking differently: Place 2 sprays into both nostrils daily as needed for allergies.)   ibuprofen (ADVIL,MOTRIN) 200 MG tablet Take by mouth.   levothyroxine (SYNTHROID) 75 MCG tablet TAKE 1 TABLET(75 MCG) BY MOUTH DAILY   lovastatin (MEVACOR) 40 MG tablet TAKE 1 TABLET(40 MG) BY MOUTH DAILY   naproxen (NAPROSYN) 500 MG tablet    sertraline (ZOLOFT) 100 MG tablet TAKE 1 TABLET BY MOUTH DAILY   Allergies  Allergen Reactions   Amoxicillin-Pot Clavulanate Diarrhea and Other (See Comments)    Has patient had a PCN reaction causing immediate rash, facial/tongue/throat swelling, SOB or lightheadedness with hypotension: No Has patient had a PCN reaction causing severe rash involving mucus membranes or skin necrosis: No Has patient had a PCN reaction that required hospitalization No Has patient had a PCN reaction occurring within the last 10 years: Yes If all of the above answers are "NO", then may proceed with Cephalosporin use.    Codeine Hives and Other (See Comments)    shakes   Latex Rash and Itching   Recent Results (from the past 2160 hour(s))  Basic Metabolic Panel (BMET)     Status: None    Collection Time: 01/09/21 12:34 PM  Result Value Ref Range   Sodium 140 135 - 145 mEq/L   Potassium 4.3 3.5 - 5.1 mEq/L   Chloride 106 96 - 112 mEq/L   CO2 27 19 - 32 mEq/L   Glucose, Bld 91 70 - 99 mg/dL   BUN 21 6 - 23 mg/dL   Creatinine, Ser 0.75 0.40 - 1.20 mg/dL   GFR 77.70 >60.00 mL/min    Comment: Calculated using the CKD-EPI Creatinine Equation (2021)   Calcium 9.5 8.4 - 10.5 mg/dL  Vitamin D (25  hydroxy)     Status: None   Collection Time: 01/09/21 12:34 PM  Result Value Ref Range   VITD 51.80 30.00 - 100.00 ng/mL   Objective  Body mass index is 27.03 kg/m. Wt Readings from Last 3 Encounters:  02/15/21 147 lb 12.8 oz (67 kg)  01/09/21 148 lb 3.2 oz (67.2 kg)  10/16/20 147 lb (66.7 kg)   Temp Readings from Last 3 Encounters:  02/15/21 98.1 F (36.7 C) (Oral)  01/09/21 98.1 F (36.7 C) (Oral)  07/20/20 98.2 F (36.8 C) (Oral)   BP Readings from Last 3 Encounters:  02/15/21 140/80  01/09/21 120/78  07/20/20 120/80   Pulse Readings from Last 3 Encounters:  02/15/21 91  01/09/21 72  07/20/20 76    Physical Exam Vitals and nursing note reviewed.  Constitutional:      Appearance: Normal appearance. She is well-developed and well-groomed.  HENT:     Head: Normocephalic and atraumatic.  Eyes:     Conjunctiva/sclera: Conjunctivae normal.     Pupils: Pupils are equal, round, and reactive to light.  Cardiovascular:     Rate and Rhythm: Normal rate and regular rhythm.     Heart sounds: Normal heart sounds. No murmur heard. Pulmonary:     Effort: Pulmonary effort is normal.     Breath sounds: Normal breath sounds.  Chest:  Breasts:    Right: Mass present.    Abdominal:     General: Abdomen is flat. Bowel sounds are normal.     Tenderness: There is no abdominal tenderness.  Musculoskeletal:        General: No tenderness.  Skin:    General: Skin is warm and dry.  Neurological:     General: No focal deficit present.     Mental Status: She is alert  and oriented to person, place, and time. Mental status is at baseline.     Cranial Nerves: Cranial nerves 2-12 are intact.     Sensory: Sensation is intact.     Motor: Motor function is intact.     Coordination: Coordination is intact.     Gait: Gait is intact.  Psychiatric:        Attention and Perception: Attention and perception normal.        Mood and Affect: Mood and affect normal.        Speech: Speech normal.        Behavior: Behavior normal. Behavior is cooperative.        Thought Content: Thought content normal.        Cognition and Memory: Cognition and memory normal.        Judgment: Judgment normal.    Assessment  Plan  History of right breast cancer in 2001 - Plan: MM DIAG BREAST TOMO BILATERAL, US BREAST LTD UNI RIGHT INC AXILLA History of lump of right breast - Plan: MM DIAG BREAST TOMO BILATERAL, US BREAST LTD UNI RIGHT INC AXILLA    Provider: Dr. Olivia Mackie McLean-Scocuzza-Internal Medicine

## 2021-02-27 ENCOUNTER — Ambulatory Visit
Admission: RE | Admit: 2021-02-27 | Discharge: 2021-02-27 | Disposition: A | Discharge status: Home / Self Care | Payer: Medicare Other | Source: Ambulatory Visit | Attending: Internal Medicine | Admitting: Internal Medicine

## 2021-02-27 ENCOUNTER — Other Ambulatory Visit: Payer: Self-pay

## 2021-02-27 DIAGNOSIS — R922 Inconclusive mammogram: Secondary | ICD-10-CM | POA: Diagnosis not present

## 2021-02-27 DIAGNOSIS — Z853 Personal history of malignant neoplasm of breast: Secondary | ICD-10-CM | POA: Diagnosis not present

## 2021-02-27 DIAGNOSIS — Z87898 Personal history of other specified conditions: Secondary | ICD-10-CM

## 2021-03-08 ENCOUNTER — Other Ambulatory Visit: Payer: Self-pay | Admitting: Family

## 2021-03-08 ENCOUNTER — Other Ambulatory Visit: Payer: Self-pay | Admitting: Family Medicine

## 2021-03-08 NOTE — Telephone Encounter (Signed)
Sent to pharmacy 

## 2021-04-03 ENCOUNTER — Other Ambulatory Visit: Payer: Self-pay | Admitting: Family Medicine

## 2021-05-02 ENCOUNTER — Other Ambulatory Visit: Payer: Self-pay | Admitting: Family

## 2021-05-03 ENCOUNTER — Other Ambulatory Visit: Payer: Self-pay | Admitting: Family

## 2021-05-03 NOTE — Telephone Encounter (Signed)
Pt need a refill on clonazepam sent to [facility] ?

## 2021-05-07 MED ORDER — CLONAZEPAM 0.5 MG PO TABS: ORAL_TABLET | ORAL | 0 refills | Status: DC

## 2021-05-07 NOTE — Telephone Encounter (Addendum)
I sent this to the pharmacy.  ?

## 2021-05-07 NOTE — Telephone Encounter (Signed)
Pt called in requesting refill on medication (clonazePAM (KLONOPIN) 0.5 MG tablet)... Pt stated that the pharmacy advised her that the refill was denied... Pt requesting callback ? ? ?

## 2021-05-07 NOTE — Addendum Note (Signed)
Addended by: Caryl Bis Raijon Lindfors G on: 05/07/2021 01:55 PM ? ? Modules accepted: Orders ? ?

## 2021-05-11 ENCOUNTER — Encounter: Payer: Self-pay | Admitting: Family Medicine

## 2021-05-11 ENCOUNTER — Ambulatory Visit (INDEPENDENT_AMBULATORY_CARE_PROVIDER_SITE_OTHER): Payer: Medicare Other

## 2021-05-11 ENCOUNTER — Ambulatory Visit (INDEPENDENT_AMBULATORY_CARE_PROVIDER_SITE_OTHER): Payer: Medicare Other | Admitting: Family Medicine

## 2021-05-11 VITALS — BP 100/60 | HR 74 | Temp 98.2°F | Ht 62.0 in | Wt 146.6 lb

## 2021-05-11 DIAGNOSIS — M5441 Lumbago with sciatica, right side: Secondary | ICD-10-CM

## 2021-05-11 DIAGNOSIS — E785 Hyperlipidemia, unspecified: Secondary | ICD-10-CM | POA: Diagnosis not present

## 2021-05-11 DIAGNOSIS — E039 Hypothyroidism, unspecified: Secondary | ICD-10-CM | POA: Diagnosis not present

## 2021-05-11 DIAGNOSIS — M545 Low back pain, unspecified: Secondary | ICD-10-CM | POA: Diagnosis not present

## 2021-05-11 MED ORDER — PREDNISONE 20 MG PO TABS: 40.0000 mg | ORAL_TABLET | Freq: Every day | ORAL | 0 refills | Status: DC

## 2021-05-11 NOTE — Assessment & Plan Note (Signed)
Check TSH.  Continue Synthroid 75 mcg once daily. 

## 2021-05-11 NOTE — Assessment & Plan Note (Signed)
Continue lovastatin 40 mg once daily. ?

## 2021-05-11 NOTE — Progress Notes (Signed)
?[name], MD ?Phone: [phone] ? ?Jenny Roberts is a 76 y.o. female who presents today for f/u. ? ?HYPOTHYROIDISM ?Disease Monitoring ?Weight changes: no  Skin Changes: drier Heat/Cold intolerance: both ? ?Medication Monitoring ?Compliance:  taking synthroid   ?Last TSH:   ?Lab Results  ?Component Value Date  ? TSH 1.37 05/22/2020  ? ?HYPERLIPIDEMIA ?Symptoms ?Chest pain on exertion:  no   Leg claudication:   no ?Medications: ?Compliance- taking lovastatin Right upper quadrant pain- no  Muscle aches- no ?Lipid Panel  ?   ?Component Value Date/Time  ? CHOL 137 05/22/2020 0936  ? CHOL 130 09/08/2013 1147  ? TRIG 70.0 05/22/2020 0936  ? HDL 47.20 05/22/2020 0936  ? HDL 41 09/08/2013 1147  ? CHOLHDL 3 05/22/2020 0936  ? VLDL 14.0 05/22/2020 0936  ? [place] 76 05/22/2020 0936  ? [place] 76 09/08/2013 1147  ? LABVLDL 13 09/08/2013 1147  ? ?Right low back pain: Patient notes this has been worsening since February.  She notes it radiates down into her right buttock and then down her right leg posteriorly.  She notes no numbness that has some burning sensation near her knee.  Notes the discomfort makes it difficult for her to get around.  She notes no injury.  She has no bowel or bladder incontinence.  She does report some agitation related to her pain and notes it does make her feel fatigued. ? ?Social History  ? ?Tobacco Use  ?Smoking Status Never  ?Smokeless Tobacco Never  ? ? ?Current Outpatient Medications on File Prior to Visit  ?Medication Sig Dispense Refill  ? acetaminophen (TYLENOL) 500 MG tablet Take by mouth.    ? aspirin 325 MG EC tablet Take 325 mg by mouth daily.    ? benzonatate (TESSALON) 200 MG capsule Take 1 capsule (200 mg total) by mouth 2 (two) times daily as needed for cough. 20 capsule 0  ? cholecalciferol (VITAMIN D) 1000 units tablet Take 1,000 Units by mouth daily.    ? clonazePAM (KLONOPIN) 0.5 MG tablet TAKE 1 TABLET(0.5 MG) BY MOUTH DAILY 30 tablet 0  ? famotidine (PEPCID) 20 MG  tablet Take 20 mg by mouth 3 times/day as needed-between meals & bedtime for heartburn or indigestion.    ? fluticasone (FLONASE) 50 MCG/ACT nasal spray Place 2 sprays into both nostrils daily. (Patient taking differently: Place 2 sprays into both nostrils daily as needed for allergies.) 16 g 6  ? ibuprofen (ADVIL,MOTRIN) 200 MG tablet Take by mouth.    ? levothyroxine (SYNTHROID) 75 MCG tablet TAKE 1 TABLET(75 MCG) BY MOUTH DAILY 90 tablet 1  ? lovastatin (MEVACOR) 40 MG tablet TAKE 1 TABLET(40 MG) BY MOUTH DAILY 90 tablet 3  ? naproxen (NAPROSYN) 500 MG tablet     ? sertraline (ZOLOFT) 100 MG tablet TAKE 1 TABLET BY MOUTH DAILY 90 tablet 1  ? ?No current facility-administered medications on file prior to visit.  ? ? ? ?ROS see history of present illness ? ?Objective ? ?Physical Exam ?Vitals:  ? 05/11/21 1601  ?BP: 100/60  ?Pulse: 74  ?Temp: 98.2 ?F (36.8 ?C)  ?SpO2: 96%  ? ? ?BP Readings from Last 3 Encounters:  ?05/11/21 100/60  ?02/15/21 140/80  ?01/09/21 120/78  ? ?Wt Readings from Last 3 Encounters:  ?05/11/21 146 lb 9.6 oz (66.5 kg)  ?02/15/21 147 lb 12.8 oz (67 kg)  ?01/09/21 148 lb 3.2 oz (67.2 kg)  ? ? ?Physical Exam ?Constitutional:   ?   General: She is  not in acute distress. ?   Appearance: She is not diaphoretic.  ?Cardiovascular:  ?   Rate and Rhythm: Normal rate and regular rhythm.  ?   Heart sounds: Normal heart sounds.  ?Pulmonary:  ?   Effort: Pulmonary effort is normal.  ?   Breath sounds: Normal breath sounds.  ?Musculoskeletal:  ?   Comments: No midline spine tenderness, no midline spine step-off, there is lumbar muscular tenderness  ?Skin: ?   General: Skin is warm and dry.  ?Neurological:  ?   Mental Status: She is alert.  ?   Comments: 5/5 strength bilateral quads, hamstrings, plantarflexion, and dorsiflexion, sensation to light touch intact bilateral lower extremities  ? ? ? ?Assessment/Plan: Please see individual problem list. ? ?Problem List Items Addressed This Visit   ? ? Hyperlipidemia  (Chronic)  ?  Continue lovastatin 40 mg once daily. ? ?  ?  ? Hypothyroidism - Primary (Chronic)  ?  Check TSH.  Continue Synthroid 75 mcg once daily. ? ?  ?  ? Relevant Orders  ? TSH  ? Right-sided low back pain with right-sided sciatica  ?  This has been going on at least a couple of months.  We will proceed with an x-ray today.  We will give her prednisone to see if that will help reduce inflammation and reduce her sciatica symptoms.  Advised of the risk of increased appetite, sleeping difficulty, and agitation.  If she develops any of these she can discontinue the prednisone.  Advised to seek medical attention if she develops bowel or bladder incontinence with her back pain.  Refer for physical therapy as well. ? ?  ?  ? Relevant Medications  ? predniSONE (DELTASONE) 20 MG tablet  ? Other Relevant Orders  ? DG Lumbar Spine Complete  ? Ambulatory referral to Physical Therapy  ? ? ?Return in about 6 months (around 11/10/2021). ? ?This visit occurred during the SARS-CoV-2 public health emergency.  Safety protocols were in place, including screening questions prior to the visit, additional usage of staff PPE, and extensive cleaning of exam room while observing appropriate contact time as indicated for disinfecting solutions.  ? ? ?[name], MD ?[place] ? ?

## 2021-05-11 NOTE — Assessment & Plan Note (Addendum)
This has been going on at least a couple of months.  We will proceed with an x-ray today.  We will give her prednisone to see if that will help reduce inflammation and reduce her sciatica symptoms.  Advised of the risk of increased appetite, sleeping difficulty, and agitation.  If she develops any of these she can discontinue the prednisone.  Advised to seek medical attention if she develops bowel or bladder incontinence with her back pain.  Refer for physical therapy as well. ?

## 2021-05-11 NOTE — Patient Instructions (Signed)
Nice to see you. ?We will contact you with your results.  ?If you develop side effects with the prednisone please let us know.  ?

## 2021-05-12 LAB — TSH: TSH: 2.07 mIU/L (ref 0.40–4.50)

## 2021-05-16 ENCOUNTER — Other Ambulatory Visit: Payer: Self-pay | Admitting: Family Medicine

## 2021-05-16 DIAGNOSIS — M5441 Lumbago with sciatica, right side: Secondary | ICD-10-CM

## 2021-05-24 NOTE — Therapy (Incomplete)
?OUTPATIENT PHYSICAL THERAPY THORACOLUMBAR EVALUATION ? ? ?Patient Name: Jenny Roberts ?MRN: [id number] ?DOB:1945-10-13, 76 y.o., female ?Today's Date: 05/24/2021 ? ? ? ?Past Medical History:  ?Diagnosis Date  ? Anxiety   ? Breast cancer ([place]) 2001  ? Right. Treated with lumpectomy followed by Chemotherapy and Radiation therapy  ? Depression   ? Hyperlipidemia   ? Nontraumatic rupture of tendon of thumb 06/15/2014  ? Personal history of chemotherapy   ? Personal history of radiation therapy   ? Right forearm fracture 05/22/2020  ? Skin cancer 09/2004  ? treated at [facility]  ? Thyroid disease   ? ?Past Surgical History:  ?Procedure Laterality Date  ? ABDOMINAL HYSTERECTOMY  1983  ? Left parts of each ovary   ? BREAST BIOPSY Right 2001  ? positive  ? BREAST EXCISIONAL BIOPSY Right 1990's  ? benign  ? BREAST LUMPECTOMY Right 08/16/1999  ? 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  Focal positive posterior margin.  0/5 lymph nodes.  ? CARPAL TUNNEL RELEASE  2016  ? COLONOSCOPY  2008  ? COLONOSCOPY WITH PROPOFOL N/A 02/16/2018  ? Procedure: COLONOSCOPY WITH PROPOFOL;  Surgeon: Jonathon Bellows, MD;  Location: [facility] ENDOSCOPY;  Service: Endoscopy;  Laterality: N/A;  ? KNEE SURGERY  2003  ? mole excision  2006  ? Basal cell   ? WRIST SURGERY Right 2010  ? ?Patient Active Problem List  ? Diagnosis Date Noted  ? Right-sided low back pain with right-sided sciatica 05/11/2021  ? Sleeping difficulty 10/16/2020  ? Osteoporosis 07/20/2020  ? Benign essential tremor 09/22/2018  ? Positive colorectal cancer screening using Cologuard test 04/06/2018  ? Hypothyroidism 07/05/2015  ? Vitamin D deficiency 07/05/2015  ? Anxiety state 06/15/2014  ? Hyperlipidemia 06/15/2014  ? Personal history of malignant neoplasm of breast 09/29/2012  ? ? ?PCP: Angela Adam. Caryl Bis, MD ? ?REFERRING PROVIDER: Angela Adam. Caryl Bis, MD ? ?REFERRING DIAG: *** ? ?THERAPY DIAG:  ?No diagnosis found. ? ?ONSET DATE:  *** ? ?SUBJECTIVE:                                                                                                                                                                                          ? ?SUBJECTIVE STATEMENT: ?*** ?PERTINENT HISTORY:  ?Patient is a 76 y.o. female who presents to outpatient physical therapy with a referral for medical diagnosis ***. This patient's chief complaints consist of ***, leading to the following functional deficits: ***. Relevant past medical history and comorbidities include ***.  Patient denies hx of {redflags:27294} ? ? ? ?PAIN:  ?Are you having pain? Yes: NPRS scale:  Current: ***/10,  Best: ***/10, Worst: ***/10. ?Pain location: *** ?Pain description: *** ?Aggravating factors: *** ?Relieving factors: ***  ? ? ?FUNCTIONAL LIMITATIONS: *** ? ?PRECAUTIONS: {Therapy precautions:24002} ? ?WEIGHT BEARING RESTRICTIONS {Yes ***/No:24003} ? ?FALLS:  ?Has patient fallen in last 6 months? {fallsyesno:27318} ? ?LIVING ENVIRONMENT: ?Lives with: {[facility] lives with:25569::"lives with their family"} ?Lives in: {Lives in:25570} ?Stairs: {opstairs:27293} ?Has following equipment at home: {Assistive devices:23999} ? ?OCCUPATION: *** ? ?LEISURE: *** ? ?PLOF: {PLOF:24004} ? ?PATIENT GOALS *** ? ? ?OBJECTIVE ? ?DIAGNOSTIC FINDINGS:  ?Lumbar xray report 05/11/2021: ?"LUMBAR SPINE - COMPLETE 4+ VIEW ?  ?COMPARISON:  None. ?  ?FINDINGS: ?Five lumbar type vertebra. There is no acute fracture or subluxation ?of the lumbar spine. The bones are osteopenic. There is severe ?multilevel degenerative changes with disc space narrowing and ?spurring. Moderate dextroscoliosis centered at L2-L3. The visualized ?posterior elements are intact. The soft tissues are unremarkable. ?  ?IMPRESSION: ?1. No acute fracture or subluxation. ?2. Severe multilevel degenerative changes and moderate ?dextroscoliosis." ? ?SELF- REPORTED FUNCTION ?FOTO score: ***/100 (***  questionnaire) ? ?OBSERVATION/INSPECTION ?Posture ?Posture (seated): forward head, rounded shoulders, slumped in sitting.  ?Posture (standing): *** ?Posture correction: *** ?Anthropometrics ?Tremor: none ?Body composition: *** ?Muscle bulk: *** ?Skin: The incision sites appear to be healing well with no excessive redness, warmth, drainage or signs of infection present.  *** ?Edema: *** ?Functional Mobility ?Bed mobility: *** ?Transfers: *** ?Gait: grossly WFL for household and short community ambulation. More detailed gait analysis deferred to later date as needed. *** ?Stairs: *** ? ?SPINE MOTION ? ?LUMBAR SPINE AROM ?*Indicates pain ?Flexion: *** ?Extension: *** ?Side Flexion:  ? R *** ? L *** ?Rotation:  ?R *** ?L *** ?Side glide:  ?R *** ?L *** ? ? ?NEUROLOGICAL ? ?Upper Motor Neuron Screen ?Babinski, Hoffman's and Clonus (ankle) negative bilaterally.  ?Dermatomes ?C2-T1 appears equal and intact to light touch except the following: *** ?L2-S2 appears equal and intact to light touch except the following: *** ?Deep Tendon Reflexes ?R/L  ?***+/***+ Biceps brachii reflex (C5, C6) ?***+/***+ Brachioradialis reflex (C6) ?***+/***+ Triceps brachii reflex (C7) ?***+/***+ Quadriceps reflex (L4) ?***+/***+ Achilles reflex (S1) ? ?SPINE MOTION ? ?CERVICAL SPINE AROM ?*Indicates pain ?Flexion: *** ?Extension: *** ?Side Flexion:  ? R *** ? L *** ?Rotation:  ?R *** ?L *** ? ? ?PERIPHERAL JOINT MOTION (in degrees) ? ?ACTIVE RANGE OF MOTION (AROM) ?*Indicates pain Date Date Date  ?Joint/Motion R/L R/L R/L  ?Shoulder     ?Flexion / / /  ?Extension / / /  ?Abduction  / / /  ?External rotation / / /  ?Internal rotation / / /  ?Elbow     ?Flexion  / / /  ?Extension  / / /  ?Wrist     ?Flexion / / /  ?Extension  / / /  ?Radial deviation / / /  ?Ulnar deviation / / /  ?Pronation / / /  ?Supination / / /  ?Hip     ?Flexion / / /  ?Extension  / / /  ?Abduction / / /  ?Adduction / / /  ?External rotation / / /  ?Internal rotation  / / /   ?Knee     ?Extension / / /  ?Flexoin / / /  ?Ankle/Foot     ?Dorsiflexion (knee ext) / / /  ?Dorsiflexion (knee flex) / / /  ?Plantarflexion / / /  ?Everison / / /  ?Inversion / / /  ?Great toe extension / / /  ?  Great toe flexion / / /  ?Comments:  ? ?PASSIVE RANGE OF MOTION (PROM) ?*Indicates pain Date Date Date  ?Joint/Motion R/L R/L R/L  ?Shoulder     ?Flexion / / /  ?Extension / / /  ?Abduction  / / /  ?External rotation / / /  ?Internal rotation / / /  ?Elbow     ?Flexion  / / /  ?Extension  / / /  ?Wrist     ?Flexion / / /  ?Extension  / / /  ?Radial deviation / / /  ?Ulnar deviation / / /  ?Pronation / / /  ?Supination / / /  ?Hip     ?Flexion  / / /  ?Extension  / / /  ?Abduction / / /  ?Adduction / / /  ?External rotation / / /  ?Internal rotation  / / /  ?Knee     ?Extension / / /  ?Flexoin / / /  ?Ankle/Foot     ?Dorsiflexion (knee ext) / / /  ?Dorsiflexion (knee flex) / / /  ?Plantarflexion / / /  ?Everison / / /  ?Inversion / / /  ?Great toe extension / / /  ?Great toe flexion / / /  ?Comments:  ? ?MUSCLE PERFORMANCE (MMT):  ?*Indicates pain Date Date Date  ?Joint/Motion R/L R/L R/L  ?Shoulder     ?Flexion / / /  ?Abduction (C5) / / /  ?External rotation / / /  ?Internal rotation / / /  ?Extension / / /  ?Elbow     ?Flexion (C6) / / /  ?Extension (C7) / / /  ?Wrist     ?Flexion (C7) / / /  ?Extension (C6) / / /  ?Radial deviation / / /  ?Ulnar deviation (C8) / / /  ?Pronation / / /  ?Supination / / /  ?Hand     ?Thumb extension (C8) / / /  ?Finger abduction (T1) / / /  ?Grip (C8) / / /  ?Hip     ?Flexion (L1, L2) / / /  ?Extension (knee ext) / / /  ?Extension (knee flex) / / /  ?Abduction / / /  ?Adduction / / /  ?External rotation / / /  ?Internal rotation  / / /  ?Knee     ?Extension (L3) / / /  ?Flexion (S2) / / /  ?Ankle/Foot     ?Dorsiflexion (L4) / / /  ?Great toe extension (L5) / / /  ?Eversion (S1) / / /  ?Plantarflexion (S1) / / /  ?Inversion / / /  ?Pronation / / /  ?Great toe flexion / / /   ?Comments:  ? ?SPECIAL TESTS: ? ?.Neurodynamictests ?Marland KitchenNeurodynamicUE ?Marland KitchenNeurodynamicLE ?Marland KitchenCspineInstability ?.CSPINESPECIALTESTS ?.SHOULDERSPECIALTESTCLUSTERS ?.HIPSPECIALTESTS ?.SIJSPECIALTESTS ? ? ?SHOULDER SPECIAL TESTS ?RTC, Impingement, Anterior Instability (macro

## 2021-05-29 ENCOUNTER — Ambulatory Visit: Payer: Medicare Other | Admitting: Physical Therapy

## 2021-06-05 ENCOUNTER — Encounter: Payer: Medicare Other | Admitting: Physical Therapy

## 2021-06-05 ENCOUNTER — Other Ambulatory Visit: Payer: Self-pay | Admitting: Family Medicine

## 2021-06-07 ENCOUNTER — Encounter: Payer: Medicare Other | Admitting: Physical Therapy

## 2021-06-07 ENCOUNTER — Other Ambulatory Visit: Payer: Self-pay | Admitting: Internal Medicine

## 2021-06-07 ENCOUNTER — Other Ambulatory Visit: Payer: Self-pay | Admitting: Family Medicine

## 2021-06-07 DIAGNOSIS — E039 Hypothyroidism, unspecified: Secondary | ICD-10-CM

## 2021-06-08 ENCOUNTER — Telehealth: Payer: Self-pay | Admitting: *Deleted

## 2021-06-08 NOTE — Telephone Encounter (Addendum)
Benefit Verification completed  PA Not Required Per [employer] during call Ref# Member ID + [id number]  $301 due at time of injection.  Pt due after 07/17/21.

## 2021-06-11 ENCOUNTER — Encounter: Payer: Self-pay | Admitting: *Deleted

## 2021-06-11 NOTE — Telephone Encounter (Signed)
Spoke with patient regarding amount due (see below) & she has a lot going on right now financially.  I have made a note to discuss alternative options with PCP at next visit on 07/10/21.  Pt can also get this through the pharmacy. She can contact them for pricing.

## 2021-06-11 NOTE — Telephone Encounter (Signed)
Noted  

## 2021-06-12 ENCOUNTER — Encounter: Payer: Medicare Other | Admitting: Physical Therapy

## 2021-06-14 ENCOUNTER — Encounter: Payer: Medicare Other | Admitting: Physical Therapy

## 2021-06-19 ENCOUNTER — Encounter: Payer: Medicare Other | Admitting: Physical Therapy

## 2021-06-21 ENCOUNTER — Encounter: Payer: Medicare Other | Admitting: Physical Therapy

## 2021-06-24 ENCOUNTER — Emergency Department (HOSPITAL_COMMUNITY): Payer: Medicare Other

## 2021-06-24 ENCOUNTER — Emergency Department (HOSPITAL_COMMUNITY)
Admission: EM | Admit: 2021-06-24 | Discharge: 2021-06-25 | Disposition: A | Discharge status: Home / Self Care | Payer: Medicare Other | Attending: Emergency Medicine | Admitting: Emergency Medicine

## 2021-06-24 ENCOUNTER — Encounter (HOSPITAL_COMMUNITY): Payer: Self-pay

## 2021-06-24 DIAGNOSIS — E039 Hypothyroidism, unspecified: Secondary | ICD-10-CM | POA: Insufficient documentation

## 2021-06-24 DIAGNOSIS — S0031XA Abrasion of nose, initial encounter: Secondary | ICD-10-CM | POA: Insufficient documentation

## 2021-06-24 DIAGNOSIS — S8002XA Contusion of left knee, initial encounter: Secondary | ICD-10-CM | POA: Insufficient documentation

## 2021-06-24 DIAGNOSIS — Z91040 Latex allergy status: Secondary | ICD-10-CM | POA: Insufficient documentation

## 2021-06-24 DIAGNOSIS — I6529 Occlusion and stenosis of unspecified carotid artery: Secondary | ICD-10-CM | POA: Diagnosis not present

## 2021-06-24 DIAGNOSIS — Z79899 Other long term (current) drug therapy: Secondary | ICD-10-CM | POA: Diagnosis not present

## 2021-06-24 DIAGNOSIS — M25562 Pain in left knee: Secondary | ICD-10-CM | POA: Insufficient documentation

## 2021-06-24 DIAGNOSIS — Z7982 Long term (current) use of aspirin: Secondary | ICD-10-CM | POA: Insufficient documentation

## 2021-06-24 DIAGNOSIS — G319 Degenerative disease of nervous system, unspecified: Secondary | ICD-10-CM | POA: Diagnosis not present

## 2021-06-24 DIAGNOSIS — S8992XA Unspecified injury of left lower leg, initial encounter: Secondary | ICD-10-CM | POA: Diagnosis present

## 2021-06-24 DIAGNOSIS — S0083XA Contusion of other part of head, initial encounter: Secondary | ICD-10-CM | POA: Diagnosis not present

## 2021-06-24 DIAGNOSIS — W010XXA Fall on same level from slipping, tripping and stumbling without subsequent striking against object, initial encounter: Secondary | ICD-10-CM | POA: Diagnosis not present

## 2021-06-24 DIAGNOSIS — Z9889 Other specified postprocedural states: Secondary | ICD-10-CM | POA: Diagnosis not present

## 2021-06-24 DIAGNOSIS — M25462 Effusion, left knee: Secondary | ICD-10-CM | POA: Diagnosis not present

## 2021-06-24 DIAGNOSIS — S0990XA Unspecified injury of head, initial encounter: Secondary | ICD-10-CM | POA: Diagnosis not present

## 2021-06-24 MED ORDER — NAPROXEN 250 MG PO TABS
500.0000 mg | ORAL_TABLET | Freq: Once | ORAL | Status: AC
  Administered 2021-06-24: 500 mg via ORAL
  Filled 2021-06-24: qty 2

## 2021-06-24 MED ORDER — NAPROXEN 500 MG PO TABS: 500.0000 mg | ORAL_TABLET | Freq: Two times a day (BID) | ORAL | 0 refills | Status: DC

## 2021-06-24 MED ORDER — BACITRACIN ZINC 500 UNIT/GM EX OINT
1.0000 "application " | TOPICAL_OINTMENT | Freq: Two times a day (BID) | CUTANEOUS | Status: DC
  Administered 2021-06-24: 1 via TOPICAL
  Filled 2021-06-24: qty 2.7

## 2021-06-24 NOTE — ED Triage Notes (Signed)
Pt states that she has a mechanical fall, hit head, not on thinners, no LOC, c/o of L knee pain

## 2021-06-24 NOTE — ED Provider Notes (Signed)
[facility] EMERGENCY DEPARTMENT Provider Note   CSN: [id number] Arrival date & time: 06/24/21  2054     History  Chief Complaint  Patient presents with   Lytle Michaels    Jenny Roberts is a 76 y.o. female.   Fall  This patient is a very pleasant 76 year old female, she has a history of hypothyroidism on Synthroid, high cholesterol on a statin, unfortunately she has had some intermittent falls in the past usually related to catching her foot on something and presents this evening after tripping over an uneven sidewalk outside of a restaurant while she was going in to eat.  This caused her to fall striking her left knee her bilateral hands and her forehead mostly on the right side.  Though she does not take anticoagulants that she does take at least a BC powder daily or every other day.  She does have a mild headache, she has pain in the left knee, she denies any other significant pain including neck back chest or abdomen pelvis.  Her right lower extremity is unhurt, she does have abrasions to her bilateral elbows as well but is able to fully range bilateral arms.  She has had a prior repair of her left patella and is concerned that she may have injured    Home Medications Prior to Admission medications   Medication Sig Start Date End Date Taking? Authorizing Provider  naproxen (NAPROSYN) 500 MG tablet Take 1 tablet (500 mg total) by mouth 2 (two) times daily with a meal. 06/24/21  Yes Noemi Chapel, MD  acetaminophen (TYLENOL) 500 MG tablet Take by mouth.    [provider]  aspirin 325 MG EC tablet Take 325 mg by mouth daily. 04/22/20   [provider]  benzonatate (TESSALON) 200 MG capsule Take 1 capsule (200 mg total) by mouth 2 (two) times daily as needed for cough. 05/25/18   Leone Haven, MD  cholecalciferol (VITAMIN D) 1000 units tablet Take 1,000 Units by mouth daily.    [provider]  clonazePAM (KLONOPIN) 0.5 MG tablet TAKE 1 TABLET(0.5  MG) BY MOUTH DAILY 06/06/21   Leone Haven, MD  famotidine (PEPCID) 20 MG tablet Take 20 mg by mouth 3 times/day as needed-between meals & bedtime for heartburn or indigestion.    [provider]  fluticasone (FLONASE) 50 MCG/ACT nasal spray Place 2 sprays into both nostrils daily. Patient taking differently: Place 2 sprays into both nostrils daily as needed for allergies. 12/06/14   Rubbie Battiest, RN  ibuprofen (ADVIL,MOTRIN) 200 MG tablet Take by mouth.    [provider]  levothyroxine (SYNTHROID) 75 MCG tablet TAKE 1 TABLET(75 MCG) BY MOUTH DAILY 06/07/21   Leone Haven, MD  lovastatin (MEVACOR) 40 MG tablet TAKE 1 TABLET(40 MG) BY MOUTH DAILY 03/08/21   Leone Haven, MD  predniSONE (DELTASONE) 20 MG tablet Take 2 tablets (40 mg total) by mouth daily with breakfast. 05/11/21   Leone Haven, MD  sertraline (ZOLOFT) 100 MG tablet TAKE 1 TABLET BY MOUTH DAILY 06/07/21   McLean-Scocuzza, Nino Glow, MD      Allergies    Amoxicillin-pot clavulanate, Codeine, and Latex    Review of Systems   Review of Systems  All other systems reviewed and are negative.  Physical Exam Updated Vital Signs BP (!) 166/68 (BP Location: Left Arm)   Pulse 67   Temp 98 F (36.7 C) (Oral)   Resp 18   SpO2 100%  Physical  Exam Vitals and nursing note reviewed.  Constitutional:      General: She is not in acute distress.    Appearance: She is well-developed.  HENT:     Head: Normocephalic.     Comments: Slight abrasion over the bridge of the nose but no tenderness over the nasal bridge, no malocclusion, no hemotympanum, no raccoon eyes, no battle sign, there is some bruising across the forehead which is minimal    Mouth/Throat:     Pharynx: No oropharyngeal exudate.  Eyes:     General: No scleral icterus.       Right eye: No discharge.        Left eye: No discharge.     Conjunctiva/sclera: Conjunctivae normal.     Pupils: Pupils are equal, round, and reactive to light.   Neck:     Thyroid: No thyromegaly.     Vascular: No JVD.     Comments: Supple neck, no tenderness over the cervical spine Cardiovascular:     Rate and Rhythm: Normal rate and regular rhythm.     Heart sounds: Normal heart sounds. No murmur heard.   No friction rub. No gallop.  Pulmonary:     Effort: Pulmonary effort is normal. No respiratory distress.     Breath sounds: Normal breath sounds. No wheezing or rales.  Abdominal:     General: Bowel sounds are normal. There is no distension.     Palpations: Abdomen is soft. There is no mass.     Tenderness: There is no abdominal tenderness.  Musculoskeletal:        General: Tenderness present. Normal range of motion.     Cervical back: Normal range of motion and neck supple.     Right lower leg: No edema.     Left lower leg: No edema.     Comments: There is some mild to moderate tenderness with range of motion of the left knee but she is able to spontaneously flex extend and straight leg raise.  The extensor mechanism is intact and the pulses are normal with the foot  Lymphadenopathy:     Cervical: No cervical adenopathy.  Skin:    General: Skin is warm and dry.     Findings: Rash present. No erythema.     Comments: Abrasions to bilateral elbows as well as her right wrist and left knee  Neurological:     Mental Status: She is alert.     Coordination: Coordination normal.     Comments: Awake alert and able to follow commands without any difficulty.  Her mental status is normal speech is clear memory is intact  Psychiatric:        Behavior: Behavior normal.    ED Results / Procedures / Treatments   Labs (all labs ordered are listed, but only abnormal results are displayed) Labs Reviewed - No data to display  EKG None  Radiology CT HEAD WO CONTRAST (5MM)  Result Date: 06/24/2021 CLINICAL DATA:  Head trauma fall EXAM: CT HEAD WITHOUT CONTRAST TECHNIQUE: Contiguous axial images were obtained from the base of the skull through the  vertex without intravenous contrast. RADIATION DOSE REDUCTION: This exam was performed according to the departmental dose-optimization program which includes automated exposure control, adjustment of the mA and/or kV according to patient size and/or use of iterative reconstruction technique. COMPARISON:  CT brain 01/16/2008 FINDINGS: Brain: No acute territorial infarction, hemorrhage or intracranial mass. Moderate atrophy. Minimal chronic small vessel ischemic change of the white matter. Nonenlarged ventricles Vascular:  No hyperdense vessels.  Carotid vascular calcification Skull: Normal. Negative for fracture or focal lesion. Sinuses/Orbits: No acute finding.  Mucosal thickening in the sinuses Other: None IMPRESSION: 1. No CT evidence for acute intracranial abnormality. 2. Atrophy Electronically Signed   By: Donavan Foil M.D.   On: 06/24/2021 21:41   DG Knee Complete 4 Views Left  Result Date: 06/24/2021 CLINICAL DATA:  Status post fall. EXAM: LEFT KNEE - COMPLETE 4+ VIEW COMPARISON:  February 24, 2013 FINDINGS: No evidence of an acute fracture or dislocation. Radiopaque surgical screws and cerclage wire are again seen within the left patella. A small joint effusion is suspected. IMPRESSION: 1. Small joint effusion without evidence of an acute osseous abnormality. 2. Stable postoperative changes involving the left patella. Electronically Signed   By: Virgina Norfolk M.D.   On: 06/24/2021 21:40    Procedures Procedures    Medications Ordered in ED Medications  bacitracin ointment 1 application. (has no administration in time range)  naproxen (NAPROSYN) tablet 500 mg (has no administration in time range)    ED Course/ Medical Decision Making/ A&P                           Medical Decision Making Amount and/or Complexity of Data Reviewed Radiology: ordered.  Risk OTC drugs. Prescription drug management.   This patient presents to the ED for concern of fall with head injury and knee pain  differential diagnosis includes intracranial injury, fracture, fracture of the patella or the knee, disruption of the prior surgical hardware    Additional history obtained:  Additional history obtained from family member at the bedside External records from outside source obtained and reviewed including prior imaging, multiple prior mammograms, lumbar x-rays which were performed in April, no recent admissions to the hospital   Lab Tests:  None   Imaging Studies ordered:  I ordered imaging studies including CT scan of the brain and the x-ray of the left knee I independently visualized and interpreted imaging which showed effusion of the knee but no signs of acute fractures dislocations or fractures of the postsurgical sites, no bleeding on the brain I agree with the radiologist interpretation   Medicines ordered and prescription drug management:  I ordered medication including Naprosyn, bacitracin and a sterile dressing for wounds of the arms and legs Reevaluation of the patient after these medicines showed that the patient improved I have reviewed the patients home medicines and have made adjustments as needed   Problem List / ED Course:  I discussed the case with the patient and her daughter at the bedside, they will help get her into her house and help to get around.  She has a walker at home but will be given a prescription in case they cannot find it.  She does request a knee immobilizer which I think is reasonable as well.  All of her wounds were treated with topical antibiotics they were cleaned and a sterile dressing was placed, she was given Naprosyn, she appears well and stable for discharge   Social Determinants of Health:  Good support system at home           Final Clinical Impression(s) / ED Diagnoses Final diagnoses:  Contusion of left knee, initial encounter    Rx / DC Orders ED Discharge Orders          Ordered    naproxen (NAPROSYN) 500 MG  tablet  2 times daily with meals  06/24/21 2253              Noemi Chapel, MD 06/24/21 2255

## 2021-06-24 NOTE — Discharge Instructions (Addendum)
I have included a prescription for a higher dose Naprosyn can be taken twice a day to help with pain.  You are not to take this medication with BC powders or any other aspirin containing products.  It is safe to take it with Tylenol.  Please take Naprosyn, '500mg'$  by mouth twice daily as needed for pain - this in an antiinflammatory medicine (NSAID) and is similar to ibuprofen - many people feel that it is stronger than ibuprofen and it is easier to take since it is a smaller pill.  Please use this only for 1 week - if your pain persists, you will need to follow up with your doctor in the office for ongoing guidance and pain control.  Additionally please be aware that the CAT scan of your brain showed no signs of bleeding on the brain or fractures of the skull, you do have some bruising that will get better over the next few days to a week.  The x-rays of your knee showed no signs of broken bones of your knee however there is some fluid inside your joint which is likely causing some of the pain and swelling, this will get better over the next couple of weeks as well.  If you need to walk with a knee immobilizer and a walker that is totally reasonable.  I would encourage you to place ice packs on your knee intermittently, make sure the ice pack is wrapped in a towel.  You will want to keep a topical antibiotic like Neosporin on top of your abrasions on your elbows and your wrist and your knee.  Please change the dressing twice a day  Return to the ER for severe or worsening symptoms.

## 2021-06-25 NOTE — Progress Notes (Signed)
Orthopedic Tech Progress Note Patient Details:  Jenny Roberts [birth year] [id number]  Ortho Devices Type of Ortho Device: Crutches, Knee Immobilizer Ortho Device/Splint Location: lle Ortho Device/Splint Interventions: Ordered, Application, Adjustment   Post Interventions Patient Tolerated: Well Instructions Provided: Care of device, Adjustment of device  Karolee Stamps 06/25/2021, 1:33 AM

## 2021-07-10 ENCOUNTER — Encounter: Payer: Self-pay | Admitting: Family Medicine

## 2021-07-10 ENCOUNTER — Ambulatory Visit (INDEPENDENT_AMBULATORY_CARE_PROVIDER_SITE_OTHER): Payer: Medicare Other | Admitting: Family Medicine

## 2021-07-10 VITALS — BP 138/80 | HR 76 | Temp 98.6°F | Ht 62.0 in | Wt 150.4 lb

## 2021-07-10 DIAGNOSIS — E039 Hypothyroidism, unspecified: Secondary | ICD-10-CM | POA: Diagnosis not present

## 2021-07-10 DIAGNOSIS — F411 Generalized anxiety disorder: Secondary | ICD-10-CM

## 2021-07-10 DIAGNOSIS — M791 Myalgia, unspecified site: Secondary | ICD-10-CM

## 2021-07-10 DIAGNOSIS — E785 Hyperlipidemia, unspecified: Secondary | ICD-10-CM | POA: Diagnosis not present

## 2021-07-10 DIAGNOSIS — M810 Age-related osteoporosis without current pathological fracture: Secondary | ICD-10-CM | POA: Diagnosis not present

## 2021-07-10 DIAGNOSIS — W19XXXA Unspecified fall, initial encounter: Secondary | ICD-10-CM

## 2021-07-10 DIAGNOSIS — S8002XA Contusion of left knee, initial encounter: Secondary | ICD-10-CM

## 2021-07-10 DIAGNOSIS — R195 Other fecal abnormalities: Secondary | ICD-10-CM

## 2021-07-10 LAB — LIPID PANEL
Cholesterol: 141 mg/dL (ref 0–200)
HDL: 47.5 mg/dL (ref >39.00)
LDL Cholesterol: 82 mg/dL (ref 0–99)
NonHDL: 93.24
Total CHOL/HDL Ratio: 3
Triglycerides: 58 mg/dL (ref 0.0–149.0)
VLDL: 11.6 mg/dL (ref 0.0–40.0)

## 2021-07-10 LAB — COMPREHENSIVE METABOLIC PANEL
ALT: 16 U/L (ref 0–35)
AST: 19 U/L (ref 0–37)
Albumin: 4.4 g/dL (ref 3.5–5.2)
Alkaline Phosphatase: 80 U/L (ref 39–117)
BUN: 25 mg/dL — ABNORMAL HIGH (ref 6–23)
CO2: 26 mEq/L (ref 19–32)
Calcium: 9.3 mg/dL (ref 8.4–10.5)
Chloride: 104 mEq/L (ref 96–112)
Creatinine, Ser: 0.87 mg/dL (ref 0.40–1.20)
GFR: 64.79 mL/min (ref >60.00)
Glucose, Bld: 98 mg/dL (ref 70–99)
Potassium: 3.9 mEq/L (ref 3.5–5.1)
Sodium: 139 mEq/L (ref 135–145)
Total Bilirubin: 0.3 mg/dL (ref 0.2–1.2)
Total Protein: 6.4 g/dL (ref 6.0–8.3)

## 2021-07-10 LAB — CK: Total CK: 138 U/L (ref 7–177)

## 2021-07-10 NOTE — Patient Instructions (Signed)
Nice to see you. If you would like to do physical therapy please let me know. We will get lab work today and contact you with the results. Please let me know in about a month how your leg pain is doing with being off of the Prolia.

## 2021-07-10 NOTE — Assessment & Plan Note (Signed)
Discussed seeing how she does off of Prolia for about a month.  Her injection is due in 1 week and I think she should see some improvement in the few weeks after she was supposed to get the injection.  If her leg pain does not improve with holding the Prolia we could consider restarting it or starting on Fosamax.  She notes no personal history of esophageal ulcers or stomach ulcers.

## 2021-07-10 NOTE — Assessment & Plan Note (Signed)
Check lipid panel.  Continue lovastatin 40 mg daily. 

## 2021-07-10 NOTE — Assessment & Plan Note (Signed)
I discussed this again with the patient.  I recommended she have a colonoscopy to follow-up on this.  Discussed without a colonoscopy we could be missing a polyp or a cancer.  Discussed that some polyps have the potential to turn into cancer.  The patient noted she did not want to have this done at this time.  I will continue to discuss this with her in the future.

## 2021-07-10 NOTE — Assessment & Plan Note (Signed)
Discussed that this may take a number of weeks to fully resolve.  Her prior x-ray was reassuring.  She will continue to monitor.  She can continue naproxen twice daily as needed with a meal and she can also take over-the-counter Tylenol.  We will check kidney function given that she has been on the naproxen.

## 2021-07-10 NOTE — Assessment & Plan Note (Signed)
Last TSH was stable.  She will continue Synthroid 75 mcg daily.

## 2021-07-10 NOTE — Assessment & Plan Note (Signed)
This seems to have been a mechanical fall.  Discussed doing some physical therapy to help with falls prevention though she noted she would like to think about this first.  Discussed wearing well fitting shoes that are stable as well.

## 2021-07-10 NOTE — Progress Notes (Signed)
[name], MD Phone: [phone]  Jenny Roberts is a 76 y.o. female who presents today for f/u.  HYPOTHYROIDISM Disease Monitoring Weight changes: no  Skin Changes: no Heat/Cold intolerance: no  Medication Monitoring Compliance:  taking synthroid   Last TSH:   Lab Results  Component Value Date   TSH 2.07 05/11/2021   Osteoporosis: Patient wonders if the Prolia is causing muscle pain in her legs.  She is not quite due for her Prolia injection.  Fall: Patient had a fall where she tripped over a lip of asphalt in a parking lot.  She hit her head and hit her left knee.  She had a left knee x-ray that did not reveal any fractures.  She had a CT head that was reassuring.  She was discharged from the ED with naproxen which she has been taking twice daily.  Her most recent fall prior to that was in April 2022.  Anxiety: Patient notes this is generally good.  She does note some depression given that they have had a lot of stuff going on recently.  No SI.  She continues on clonazepam once nightly for anxiety.  It does not make her drowsy the next day.  She also takes Zoloft.  She does note some anxiety surrounding risk of falling.  Social History   Tobacco Use  Smoking Status Never  Smokeless Tobacco Never    Current Outpatient Medications on File Prior to Visit  Medication Sig Dispense Refill   acetaminophen (TYLENOL) 500 MG tablet Take by mouth.     aspirin 325 MG EC tablet Take 325 mg by mouth daily.     cholecalciferol (VITAMIN D) 1000 units tablet Take 1,000 Units by mouth daily.     clonazePAM (KLONOPIN) 0.5 MG tablet TAKE 1 TABLET(0.5 MG) BY MOUTH DAILY 30 tablet 1   ibuprofen (ADVIL,MOTRIN) 200 MG tablet Take by mouth.     levothyroxine (SYNTHROID) 75 MCG tablet TAKE 1 TABLET(75 MCG) BY MOUTH DAILY 90 tablet 1   lovastatin (MEVACOR) 40 MG tablet TAKE 1 TABLET(40 MG) BY MOUTH DAILY 90 tablet 3   naproxen (NAPROSYN) 500 MG tablet Take 1 tablet (500 mg total) by mouth 2  (two) times daily with a meal. 30 tablet 0   sertraline (ZOLOFT) 100 MG tablet TAKE 1 TABLET BY MOUTH DAILY 90 tablet 1   No current facility-administered medications on file prior to visit.     ROS see history of present illness  Objective  Physical Exam Vitals:   07/10/21 1010  BP: 138/80  Pulse: 76  Temp: 98.6 F (37 C)  SpO2: 95%    BP Readings from Last 3 Encounters:  07/10/21 138/80  06/24/21 (!) 146/79  05/11/21 100/60   Wt Readings from Last 3 Encounters:  07/10/21 150 lb 6.4 oz (68.2 kg)  05/11/21 146 lb 9.6 oz (66.5 kg)  02/15/21 147 lb 12.8 oz (67 kg)    Physical Exam Constitutional:      General: She is not in acute distress.    Appearance: She is not diaphoretic.  Cardiovascular:     Rate and Rhythm: Normal rate and regular rhythm.     Heart sounds: Normal heart sounds.  Pulmonary:     Effort: Pulmonary effort is normal.     Breath sounds: Normal breath sounds.  Musculoskeletal:     Comments: Left knee with no warmth, swelling, or erythema, there is slight tenderness over the patella with no palpable bony defects  Skin:  General: Skin is warm and dry.  Neurological:     Mental Status: She is alert.      Assessment/Plan: Please see individual problem list.  Problem List Items Addressed This Visit     Anxiety state (Chronic)    Chronic issue.  Slightly worsened recently with some life events.  She will continue Zoloft 100 mg daily.  She can continue the clonazepam 0.5 mg nightly for anxiety though I did discuss that if she has any future falls we may need to taper her off of this medication.      Hyperlipidemia (Chronic)    Check lipid panel.  Continue lovastatin 40 mg daily.      Relevant Orders   Comp Met (CMET)   Lipid panel   Hypothyroidism (Chronic)    Last TSH was stable.  She will continue Synthroid 75 mcg daily.      Osteoporosis (Chronic)    Discussed seeing how she does off of Prolia for about a month.  Her injection is  due in 1 week and I think she should see some improvement in the few weeks after she was supposed to get the injection.  If her leg pain does not improve with holding the Prolia we could consider restarting it or starting on Fosamax.  She notes no personal history of esophageal ulcers or stomach ulcers.      Contusion of left knee    Discussed that this may take a number of weeks to fully resolve.  Her prior x-ray was reassuring.  She will continue to monitor.  She can continue naproxen twice daily as needed with a meal and she can also take over-the-counter Tylenol.  We will check kidney function given that she has been on the naproxen.      Fall    This seems to have been a mechanical fall.  Discussed doing some physical therapy to help with falls prevention though she noted she would like to think about this first.  Discussed wearing well fitting shoes that are stable as well.      Positive colorectal cancer screening using Cologuard test    I discussed this again with the patient.  I recommended she have a colonoscopy to follow-up on this.  Discussed without a colonoscopy we could be missing a polyp or a cancer.  Discussed that some polyps have the potential to turn into cancer.  The patient noted she did not want to have this done at this time.  I will continue to discuss this with her in the future.      Other Visit Diagnoses     Myalgia    -  Primary   Relevant Orders   CK (Creatine Kinase)       Return in about 3 months (around 10/10/2021) for Anxiety.   [name], MD [place]

## 2021-07-10 NOTE — Assessment & Plan Note (Signed)
Chronic issue.  Slightly worsened recently with some life events.  She will continue Zoloft 100 mg daily.  She can continue the clonazepam 0.5 mg nightly for anxiety though I did discuss that if she has any future falls we may need to taper her off of this medication.

## 2021-08-05 ENCOUNTER — Other Ambulatory Visit: Payer: Self-pay | Admitting: Family Medicine

## 2021-08-27 ENCOUNTER — Ambulatory Visit (INDEPENDENT_AMBULATORY_CARE_PROVIDER_SITE_OTHER): Payer: Medicare Other

## 2021-08-27 VITALS — Ht 62.0 in | Wt 150.0 lb

## 2021-08-27 DIAGNOSIS — Z0000 Encounter for general adult medical examination without abnormal findings: Secondary | ICD-10-CM | POA: Diagnosis not present

## 2021-08-27 NOTE — Progress Notes (Signed)
Subjective:   Jenny Roberts is a 76 y.o. female who presents for Medicare Annual (Subsequent) preventive examination.  Review of Systems    No ROS.  Medicare Wellness Virtual Visit.  Visual/audio telehealth visit, UTA vital signs.   See social history for additional risk factors.   Cardiac Risk Factors include: advanced age (>62mn, >>51women)     Objective:    Today's Vitals   08/27/21 0952  Weight: 150 lb (68 kg)  Height: 5' 2" (1.575 m)   Body mass index is 27.44 kg/m.     08/27/2021    9:56 AM 06/24/2021    9:08 PM 08/24/2020   10:58 AM 08/24/2019   10:47 AM 08/21/2018    1:59 PM 02/16/2018    9:03 AM 08/18/2017    3:14 PM  Advanced Directives  Does Patient Have a Medical Advance Directive? _0  No No  Does patient want to make changes to medical advance directive?       No - Patient declined  Would patient like information on creating a medical advance directive? No - Patient declined  No - Patient declined No - Patient declined No - Patient declined      Current Medications (verified) Outpatient Encounter Medications as of 08/27/2021  Medication Sig   acetaminophen (TYLENOL) 500 MG tablet Take by mouth.   aspirin 325 MG EC tablet Take 325 mg by mouth daily.   cholecalciferol (VITAMIN D) 1000 units tablet Take 1,000 Units by mouth daily.   clonazePAM (KLONOPIN) 0.5 MG tablet TAKE 1 TABLET(0.5 MG) BY MOUTH DAILY   ibuprofen (ADVIL,MOTRIN) 200 MG tablet Take by mouth.   levothyroxine (SYNTHROID) 75 MCG tablet TAKE 1 TABLET(75 MCG) BY MOUTH DAILY   lovastatin (MEVACOR) 40 MG tablet TAKE 1 TABLET(40 MG) BY MOUTH DAILY   naproxen (NAPROSYN) 500 MG tablet Take 1 tablet (500 mg total) by mouth 2 (two) times daily with a meal.   sertraline (ZOLOFT) 100 MG tablet TAKE 1 TABLET BY MOUTH DAILY   No facility-administered encounter medications on file as of 08/27/2021.    Allergies (verified) Amoxicillin-pot clavulanate, Codeine, and Latex   History: Past Medical  History:  Diagnosis Date   Anxiety    Breast cancer ([place] 2001   Right. Treated with lumpectomy followed by Chemotherapy and Radiation therapy   Depression    Hyperlipidemia    Nontraumatic rupture of tendon of thumb 06/15/2014   Personal history of chemotherapy    Personal history of radiation therapy    Right forearm fracture 05/22/2020   Skin cancer 09/2004   treated at [facility]   Thyroid disease    Past Surgical History:  Procedure Laterality Date   ABDOMINAL HYSTERECTOMY  1983   Left parts of each ovary    BREAST BIOPSY Right 2001   positive   BREAST EXCISIONAL BIOPSY Right 1990's   benign   BREAST LUMPECTOMY Right 08/16/1999   Metaplastic carcinoma, grade 3; 1.7 cm, T1c, N0; 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  Focal positive posterior margin.  0/5 lymph nodes.   CARPAL TUNNEL RELEASE  2016   COLONOSCOPY  2008   COLONOSCOPY WITH PROPOFOL N/A 02/16/2018   Procedure: COLONOSCOPY WITH PROPOFOL;  Surgeon: AJonathon Bellows MD;  [facility];  Service: Endoscopy;  Laterality: N/A;   KNEE SURGERY  2003   mole excision  2006   Basal cell    WRIST SURGERY Right 2010   Family History  Problem Relation Age of Onset  Brain cancer Father    Cancer Maternal Grandfather 66       Colon Cancer   Breast cancer Neg Hx    Social History   Socioeconomic History   Marital status: Married    Spouse name: Not on file   Number of children: Not on file   Years of education: Not on file   Highest education level: Not on file  Occupational History   Not on file  Tobacco Use   Smoking status: Never   Smokeless tobacco: Never  Vaping Use   Vaping Use: Never used  Substance and Sexual Activity   Alcohol use: No    Alcohol/week: 0.0 standard drinks of alcohol   Drug use: No   Sexual activity: Not Currently  Other Topics Concern   Not on file  Social History Narrative   She is a homemaker and babysits   Children- 2 daughters    8 grandchildren, 3 great grandchildren     Pets: None   Caffeine- Decaf coffee, rare Dr. Malachi Bonds    Enjoys- gardening, cross stitch, and quilt   Social Determinants of Health   Financial Resource Strain: Low Risk  (08/27/2021)   Overall Financial Resource Strain (CARDIA)    Difficulty of Paying Living Expenses: Not hard at all  Food Insecurity: No Food Insecurity (08/27/2021)   Hunger Vital Sign    Worried About Running Out of Food in the Last Year: Never true    [place] in the Last Year: Never true  Transportation Needs: No Transportation Needs (08/27/2021)   PRAPARE - Hydrologist (Medical): No    Lack of Transportation (Non-Medical): No  Physical Activity: Unknown (08/18/2017)   Exercise Vital Sign    Days of Exercise per Week: 0 days    Minutes of Exercise per Session: Not on file  Stress: No Stress Concern Present (08/27/2021)   [place]    Feeling of Stress : Only a little  Social Connections: Unknown (08/27/2021)   Social Connection and Isolation Panel [NHANES]    Frequency of Communication with Friends and Family: More than three times a week    Frequency of Social Gatherings with Friends and Family: More than three times a week    Attends Religious Services: Not on Advertising copywriter or Organizations: Not on file    Attends Archivist Meetings: Not on file    Marital Status: Married    Tobacco Counseling Counseling given: Not Answered   Clinical Intake:  Pre-visit preparation completed: Yes        Diabetes: No  How often do you need to have someone help you when you read instructions, pamphlets, or other written materials from your doctor or pharmacy?: 1 - Never  Interpreter Needed?: No    Activities of Daily Living    08/27/2021    9:57 AM  In your present state of health, do you have any difficulty performing the following activities:  Hearing? 0  Vision? 0  Difficulty  concentrating or making decisions? 0  Walking or climbing stairs? 0  Comment Paces self with activity  Dressing or bathing? 0  Doing errands, shopping? 0  Preparing Food and eating ? N  Using the Toilet? N  In the past six months, have you accidently leaked urine? N  Do you have problems with loss of bowel control? N  Managing your Medications? N  Managing your Finances? N  Housekeeping or managing your Housekeeping? N    Patient Care Team: Leone Haven, MD as PCP - General (Family Medicine) Christene Lye, MD (General Surgery)  Indicate any recent Medical Services you may have received from other than [facility] providers in the past year (date may be approximate).     Assessment:   This is a routine wellness examination for Faithlynn.  Virtual Visit via Telephone Note  I connected with  Jenny Roberts on 08/27/21 at  9:45 AM EDT by telephone and verified that I am speaking with the correct person using two identifiers.  Location: Patient: home Provider: office Persons participating in the virtual visit: patient/Nurse Health Advisor   I discussed the limitations of performing an evaluation and management service by telehealth. We continued and completed visit with audio only. Some vital signs may be absent or patient reported.   Hearing/Vision screen Hearing Screening - Comments:: Patient is able to hear conversational tones without difficulty.  No issues reported. Vision Screening - Comments:: Followed by Dr. Gloriann Loan  Wears corrective lenses  They have seen their ophthalmologist in the last 12 months  Dietary issues and exercise activities discussed: Current Exercise Habits: Home exercise routine, Intensity: Mild   Goals Addressed             This Visit's Progress    Decrease risk of falls       Stay active  Pace self with walking/activity       Depression Screen    08/27/2021    9:55 AM 05/11/2021    4:06 PM 01/09/2021   12:10 PM 08/24/2020   10:41 AM  05/22/2020    9:18 AM 11/23/2019    9:11 AM 08/24/2019   10:45 AM  PHQ 2/9 Scores  PHQ - 2 Score 0 0 0 0 0 0 0    Fall Risk    08/27/2021    9:57 AM 05/11/2021    4:06 PM 01/09/2021   12:10 PM 05/22/2020    9:18 AM 11/23/2019    9:10 AM  Fall Risk   Falls in the past year? 1 0 0 1 0  Number falls in past yr:  0 0 0 0  Injury with Fall?  0 0 1   Risk for fall due to : History of fall(s) No Fall Risks No Fall Risks    Follow up _0     FALL RISK PREVENTION PERTAINING TO THE HOME: Home free of loose throw rugs in walkways, pet beds, electrical cords, etc? Yes  Adequate lighting in your home to reduce risk of falls? Yes   ASSISTIVE DEVICES UTILIZED TO PREVENT FALLS: Life alert? No  Use of a cane, walker or w/c? No   TIMED UP AND GO: Was the test performed? No .   Cognitive Function: Patient is alert and oriented x3.      08/18/2017    3:22 PM 08/16/2016    3:56 PM 07/10/2015    3:48 PM  MMSE - Mini Mental State Exam  Orientation to time _1 Orientation to Place _2 Registration _3 Attention/ Calculation _4 Recall _5 Language- name 2 objects _6 Language- repeat _7 Language- follow 3 step command _8 Language- read & follow direction _9 Write a  sentence _0 Copy design _1 Total score _2 08/24/2020   10:43 AM 08/21/2018    1:47 PM  6CIT Screen  What Year? 0 points 0 points  What month? 0 points 0 points  What time? 0 points 0 points  Count back from 20 0 points 0 points  Months in reverse 0 points 0 points  Repeat phrase 0 points 0 points  Total Score 0 points 0 points    Immunizations Immunization History  Administered Date(s) Administered   Fluad Quad(high Dose 65+) 09/22/2018, 11/23/2019, 01/09/2021   Influenza, High Dose Seasonal PF 10/06/2015, 02/28/2017, 12/26/2017    Influenza-Unspecified 11/07/2014   PFIZER(Purple Top)SARS-COV-2 Vaccination 02/27/2019, 03/20/2019, 11/01/2019   Pneumococcal Conjugate-13 07/10/2015   Pneumococcal Polysaccharide-23 08/16/2016   Tdap 04/21/2020    Shingrix Completed?: No.    Education has been provided regarding the importance of this vaccine. Patient has been advised to call insurance company to determine out of pocket expense if they have not yet received this vaccine. Advised may also receive vaccine at local pharmacy or Health Dept. Verbalized acceptance and understanding.  Screening Tests Health Maintenance  Topic Date Due   INFLUENZA VACCINE  08/21/2021   Zoster Vaccines- Shingrix (1 of 2) 11/27/2021 (Originally [birth year]/1995)   COVID-19 Vaccine (4 - Pfizer series) 02/21/2022 (Originally 12/27/2019)   MAMMOGRAM  11/30/2021   TETANUS/TDAP  04/22/2030   Pneumonia Vaccine 39+ Years old  Completed   DEXA SCAN  Completed   Hepatitis C Screening  Completed   HPV VACCINES  Aged Out   COLONOSCOPY (Pts 45-68yr Insurance coverage will need to be confirmed)  Discontinued   Health Maintenance Health Maintenance Due  Topic Date Due   INFLUENZA VACCINE  08/21/2021   Lung Cancer Screening: (Low Dose CT Chest recommended if Age 76-80years, 30 pack-year currently smoking OR have quit w/in 15years.) does not qualify.   Vision Screening: Recommended annual ophthalmology exams for early detection of glaucoma and other disorders of the eye.  Dental Screening: Recommended annual dental exams for proper oral hygiene  Community Resource Referral / Chronic Care Management: CRR required this visit?  No   CCM required this visit?  No      Plan:   Keep all routine maintenance appointments.   I have personally reviewed and noted the following in the patient's chart:   Medical and social history Use of alcohol, tobacco or illicit drugs  Current medications and supplements including opioid prescriptions.  Functional ability and  status Nutritional status Physical activity Advanced directives List of other physicians Hospitalizations, surgeries, and ER visits in previous 12 months Vitals Screenings to include cognitive, depression, and falls Referrals and appointments  In addition, I have reviewed and discussed with patient certain preventive protocols, quality metrics, and best practice recommendations. A written personalized care plan for preventive services as well as general preventive health recommendations were provided to patient.     OVarney Biles LPN   88/01/1570

## 2021-08-27 NOTE — Patient Instructions (Addendum)
  Jenny Roberts , Thank you for taking time to come for your Medicare Wellness Visit. I appreciate your ongoing commitment to your health goals. Please review the following plan we discussed and let me know if I can assist you in the future.   These are the goals we discussed:  Goals       Patient Stated     DIET - INCREASE WATER INTAKE (pt-stated)      Weight less than 140lb      Other     Decrease risk of falls      Stay active  Pace self with walking/activity        This is a list of the screening recommended for you and due dates:  Health Maintenance  Topic Date Due   Flu Shot  08/21/2021   Zoster (Shingles) Vaccine (1 of 2) 11/27/2021*   COVID-19 Vaccine (4 - Pfizer series) 02/21/2022*   Mammogram  11/30/2021   Tetanus Vaccine  04/22/2030   Pneumonia Vaccine  Completed   DEXA scan (bone density measurement)  Completed   Hepatitis C Screening: USPSTF Recommendation to screen - Ages 45-79 yo.  Completed   HPV Vaccine  Aged Out   Colon Cancer Screening  Discontinued  *Topic was postponed. The date shown is not the original due date.

## 2021-09-05 ENCOUNTER — Other Ambulatory Visit: Payer: Self-pay | Admitting: Family

## 2021-09-06 ENCOUNTER — Encounter: Payer: Self-pay | Admitting: Student in an Organized Health Care Education/Training Program

## 2021-09-06 ENCOUNTER — Ambulatory Visit
Payer: Medicare Other | Attending: Student in an Organized Health Care Education/Training Program | Admitting: Student in an Organized Health Care Education/Training Program

## 2021-09-06 VITALS — BP 138/76 | HR 78 | Temp 97.7°F | Resp 16 | Ht 63.0 in | Wt 147.0 lb

## 2021-09-06 DIAGNOSIS — M5416 Radiculopathy, lumbar region: Secondary | ICD-10-CM | POA: Diagnosis not present

## 2021-09-06 DIAGNOSIS — M51369 Other intervertebral disc degeneration, lumbar region without mention of lumbar back pain or lower extremity pain: Secondary | ICD-10-CM | POA: Insufficient documentation

## 2021-09-06 DIAGNOSIS — M47816 Spondylosis without myelopathy or radiculopathy, lumbar region: Secondary | ICD-10-CM | POA: Diagnosis not present

## 2021-09-06 DIAGNOSIS — G894 Chronic pain syndrome: Secondary | ICD-10-CM | POA: Diagnosis not present

## 2021-09-06 DIAGNOSIS — G8929 Other chronic pain: Secondary | ICD-10-CM | POA: Diagnosis not present

## 2021-09-06 DIAGNOSIS — M5136 Other intervertebral disc degeneration, lumbar region: Secondary | ICD-10-CM | POA: Diagnosis not present

## 2021-09-06 MED ORDER — DICLOFENAC SODIUM 50 MG PO TBEC: 50.0000 mg | DELAYED_RELEASE_TABLET | Freq: Two times a day (BID) | ORAL | 0 refills | Status: AC

## 2021-09-06 MED ORDER — GABAPENTIN 100 MG PO CAPS: ORAL_CAPSULE | ORAL | 0 refills | Status: DC

## 2021-09-06 NOTE — Progress Notes (Signed)
Patient: Jenny Roberts  Service Category: E/M  Provider: Gillis Santa, MD  DOB: August 14, 1945  DOS: 09/06/2021  Referring Provider: Leone Haven, MD  MRN: [id number]  Setting: Ambulatory outpatient  PCP: Leone Haven, MD  Type: New Patient  Specialty: Interventional Pain Management    Location: Office  Delivery: Face-to-face     Primary Reason(s) for Visit: Encounter for initial evaluation of one or more chronic problems (new to examiner) potentially causing chronic pain, and posing a threat to normal musculoskeletal function. (Level of risk: High) CC: Back Pain (lower)  HPI  Jenny Roberts is a 76 y.o. year old, female patient, who comes for the first time to our practice referred by Leone Haven, MD for our initial evaluation of her chronic pain. She has Personal history of malignant neoplasm of breast; Anxiety state; Hyperlipidemia; Hypothyroidism; Vitamin D deficiency; Positive colorectal cancer screening using Cologuard test; Benign essential tremor; Fall; Osteoporosis; Sleeping difficulty; Right-sided low back pain with right-sided sciatica; Contusion of left knee; Lumbar radiculopathy; Lumbar facet arthropathy; Lumbar degenerative disc disease; and Chronic pain syndrome on their problem list. Today she comes in for evaluation of her Back Pain (lower)  Pain Assessment: Location: Lower Back Radiating: right buttock Onset: More than a month ago Duration: Chronic pain Quality: Throbbing Severity: 6 /10 (subjective, self-reported pain score)  Effect on ADL: difficulty performing daily activities Timing: Constant Modifying factors: Tylenol, topical, ice, lying down BP: 138/76  HR: 78  Onset and Duration: Gradual and Date of onset: 1 year ago Cause of pain: Unknown Severity: Getting worse, NAS-11 at its worse: 10/10, NAS-11 at its best: 3/10, NAS-11 now: ./10, and NAS-11 on the average: 7/10 Timing: Not influenced by the time of the day, During activity or exercise, and After  activity or exercise Aggravating Factors: Bending, Climbing, Kneeling, Lifiting, Motion, Prolonged sitting, Prolonged standing, Squatting, Stooping , Twisting, Walking, Walking uphill, and Walking downhill Alleviating Factors: Cold packs, Lying down, Medications, Resting, and Sleeping Associated Problems: Day-time cramps, Night-time cramps, Depression, Fatigue, Nausea, Numbness, Spasms, Tingling, Weakness, and Pain that wakes patient up Quality of Pain: Aching, Burning, Constant, Cramping, Distressing, Exhausting, Fearful, Nagging, Punishing, Sharp, Shooting, Sickening, Throbbing, Tingling, and Tiring Previous Examinations or Tests: CT scan and X-rays Previous Treatments: Steroid treatments by mouth  Jenny Roberts is a very pleasant 76 year old female who presents with a chief complaint of low back pain with radiation primarily into her right buttock, right lateral thigh and down her medial calf in a dermatomal fashion.  This has been going on for approximately 3 years where it has been particularly aggravating for her.  She states that her husband can no longer drive given his vision issues and that she has to drive and do many of their household chores.  She has tried naproxen, ibuprofen with some response.  She has also tried acetaminophen when her pain is severe.  She denies having tried diclofenac, Celebrex, meloxicam.  She denies having tried gabapentin, Lyrica.  Kidney function within normal limits.  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  She is open to a home directed physician prescribed physical therapy regimen.  Meds   Current Outpatient Medications:    acetaminophen (TYLENOL) 500 MG tablet, Take by mouth., Disp: , Rfl:    cholecalciferol (VITAMIN D) 1000 units tablet, Take 1,000 Units by mouth  2 (two) times daily., Disp:  , Rfl:    clonazePAM (KLONOPIN) 0.5 MG tablet, TAKE 1 TABLET(0.5 MG) BY MOUTH DAILY, Disp: 30 tablet, Rfl: 0   diclofenac (VOLTAREN) 50 MG EC tablet, Take 1 tablet (50 mg total) by mouth 2 (two) times daily for 15 days., Disp: 30 tablet, Rfl: 0   gabapentin (NEURONTIN) 100 MG capsule, Take 1 capsule (100 mg total) by mouth at bedtime for 7 days, THEN 2 capsules (200 mg total) at bedtime for 7 days, THEN 3 capsules (300 mg total) at bedtime for 16 days. Follow written titration schedule., Disp: 69 capsule, Rfl: 0   levothyroxine (SYNTHROID) 75 MCG tablet, TAKE 1 TABLET(75 MCG) BY MOUTH DAILY, Disp: 90 tablet, Rfl: 1   lovastatin (MEVACOR) 40 MG tablet, TAKE 1 TABLET(40 MG) BY MOUTH DAILY, Disp: 90 tablet, Rfl: 3   sertraline (ZOLOFT) 100 MG tablet, TAKE 1 TABLET BY MOUTH DAILY, Disp: 90 tablet, Rfl: 1  Imaging Review  DG Lumbar Spine Complete  Narrative CLINICAL DATA:  Low back pain radiating down right leg x 2 months.  EXAM: LUMBAR SPINE - COMPLETE 4+ VIEW  COMPARISON:  None.  FINDINGS: Five lumbar type vertebra. There is no acute fracture or subluxation of the lumbar spine. The bones are osteopenic. There is severe multilevel degenerative changes with disc space narrowing and spurring. Moderate dextroscoliosis centered at L2-L3. The visualized posterior elements are intact. The soft tissues are unremarkable.  IMPRESSION: 1. No acute fracture or subluxation. 2. Severe multilevel degenerative changes and moderate dextroscoliosis.   Electronically Signed By: Anner Crete M.D. On: 05/14/2021 14:09   Narrative CLINICAL DATA:  Status post fall.  EXAM: LEFT KNEE - COMPLETE 4+ VIEW  COMPARISON:  February 24, 2013  FINDINGS: No evidence of an acute fracture or dislocation. Radiopaque surgical screws and cerclage wire are again seen within the left patella. A small joint effusion is suspected.  IMPRESSION: 1. Small joint effusion without evidence of an acute  osseous abnormality. 2. Stable postoperative changes involving the left patella.   Electronically Signed By: Virgina Norfolk M.D. On: 06/24/2021 21:40 \ Complexity Note: Imaging results reviewed.                         ROS  Cardiovascular: No reported cardiovascular signs or symptoms such as High blood pressure, coronary artery disease, abnormal heart rate or rhythm, heart attack, blood thinner therapy or heart weakness and/or failure Pulmonary or Respiratory: Snoring  Neurological: No reported neurological signs or symptoms such as seizures, abnormal skin sensations, urinary and/or fecal incontinence, being born with an abnormal open spine and/or a tethered spinal cord Psychological-Psychiatric: Anxiousness and Depressed Gastrointestinal: No reported gastrointestinal signs or symptoms such as vomiting or evacuating blood, reflux, heartburn, alternating episodes of diarrhea and constipation, inflamed or scarred liver, or pancreas or irrregular and/or infrequent bowel movements Genitourinary: Difficulty emptying the bladder or controlling the flow of urine (Neurogenic bladder) Hematological: Brusing easily Endocrine: Slow thyroid Rheumatologic: No reported rheumatological signs and symptoms such as fatigue, joint pain, tenderness, swelling, redness, heat, stiffness, decreased range of motion, with or without associated rash Musculoskeletal: Negative for myasthenia gravis, muscular dystrophy, multiple sclerosis or malignant hyperthermia Work History: Retired  Allergies  Ms. Ehle is allergic to amoxicillin-pot clavulanate, codeine, and latex.  Laboratory Chemistry Profile   Renal Lab Results  Component Value Date   BUN 25 (H) 07/10/2021   CREATININE 0.87 07/10/2021   BCR 28 (H) 10/10/2014   GFR  64.79 07/10/2021   GFRAA >60 09/19/2015   GFRNONAA >60 09/19/2015   SPECGRAV >=1.030 (A) 10/27/2018   PHUR 6.0 10/27/2018   PROTEINUR Negative 10/27/2018     Electrolytes Lab  Results  Component Value Date   NA 139 07/10/2021   K 3.9 07/10/2021   CL 104 07/10/2021   CALCIUM 9.3 07/10/2021   MG 1.8 09/19/2015     Hepatic Lab Results  Component Value Date   AST 19 07/10/2021   ALT 16 07/10/2021   ALBUMIN 4.4 07/10/2021   ALKPHOS 80 07/10/2021   LIPASE 18 09/17/2015     ID Lab Results  Component Value Date   HCVAB NEGATIVE 08/16/2016     Bone Lab Results  Component Value Date   VD25OH 51.80 01/09/2021     Endocrine Lab Results  Component Value Date   GLUCOSE 98 07/10/2021   GLUCOSEU NEGATIVE 09/17/2015   HGBA1C 5.2 02/28/2017   TSH 2.07 05/11/2021     Neuropathy Lab Results  Component Value Date   HGBA1C 5.2 02/28/2017     CNS No results found for: "COLORCSF", "APPEARCSF", "RBCCOUNTCSF", "WBCCSF", "POLYSCSF", "LYMPHSCSF", "EOSCSF", "PROTEINCSF", "GLUCCSF", "JCVIRUS", "CSFOLI", "IGGCSF", "LABACHR", "ACETBL"   Inflammation (CRP: Acute  ESR: Chronic) Lab Results  Component Value Date   LATICACIDVEN 1.4 09/17/2015     Rheumatology No results found for: "RF", "ANA", "LABURIC", "URICUR", "LYMEIGGIGMAB", "LYMEABIGMQN", "HLAB27"   Coagulation Lab Results  Component Value Date   PLT 117 (L) 09/18/2015     Cardiovascular Lab Results  Component Value Date   CKTOTAL 138 07/10/2021   HGB 13.6 09/18/2015   HCT 39.3 09/18/2015     Screening Lab Results  Component Value Date   HCVAB NEGATIVE 08/16/2016     Cancer Lab Results  Component Value Date   LABCA2 31.4 10/16/2015     Allergens No results found for: "ALMOND", "APPLE", "ASPARAGUS", "AVOCADO", "BANANA", "BARLEY", "BASIL", "BAYLEAF", "GREENBEAN", "LIMABEAN", "WHITEBEAN", "BEEFIGE", "REDBEET", "BLUEBERRY", "BROCCOLI", "CABBAGE", "MELON", "CARROT", "CASEIN", "CASHEWNUT", "CAULIFLOWER", "CELERY"     Note: Lab results reviewed.  PFSH  Drug: Ms. Lauf  reports no history of drug use. Alcohol:  reports no history of alcohol use. Tobacco:  reports that she has never  smoked. She has never used smokeless tobacco. Medical:  has a past medical history of Anxiety, Breast cancer ([place]) (2001), Depression, Hyperlipidemia, Nontraumatic rupture of tendon of thumb (06/15/2014), Personal history of chemotherapy, Personal history of radiation therapy, Right forearm fracture (05/22/2020), Skin cancer (09/2004), and Thyroid disease. Family: family history includes Brain cancer in her father; Cancer (age of onset: 55) in her maternal grandfather.  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  Focal positive posterior margin.  0/5 lymph nodes.   CARPAL TUNNEL RELEASE  2016   COLONOSCOPY  2008   COLONOSCOPY WITH PROPOFOL N/A 02/16/2018   Procedure: COLONOSCOPY WITH PROPOFOL;  Surgeon: Jonathon Bellows, MD;  Location: [facility] ENDOSCOPY;  Service: Endoscopy;  Laterality: N/A;   KNEE SURGERY  2003   mole excision  2006   Basal cell    WRIST SURGERY Right 2010   Active Ambulatory Problems    Diagnosis Date Noted   Personal history of malignant neoplasm of breast  09/29/2012   Anxiety state 06/15/2014   Hyperlipidemia 06/15/2014   Hypothyroidism 07/05/2015   Vitamin D deficiency 07/05/2015   Positive colorectal cancer screening using Cologuard test 04/06/2018   Benign essential tremor 09/22/2018   Fall 11/23/2019   Osteoporosis 07/20/2020   Sleeping difficulty 10/16/2020   Right-sided low back pain with right-sided sciatica 05/11/2021   Contusion of left knee 07/10/2021   Lumbar radiculopathy 09/06/2021   Lumbar facet arthropathy 09/06/2021   Lumbar degenerative disc disease 09/06/2021   Chronic pain syndrome 09/06/2021   Resolved Ambulatory Problems    Diagnosis Date Noted   Diverticulitis of  colon (without mention of hemorrhage)(562.11) 09/29/2012   Encounter to establish care 06/15/2014   Nontraumatic rupture of tendon of thumb 06/15/2014   History of breast cancer in female 10/10/2014   Laryngitis 11/16/2014   Encounter for screening mammogram for breast cancer 12/19/2014   Abnormal thyroid exam 07/05/2015   UTI (urinary tract infection) 09/17/2015   Sepsis ([place]) 09/17/2015   Gastroenteritis 10/06/2015   Strain of buttock, right, initial encounter 12/26/2017   Elevated blood pressure reading without diagnosis of hypertension 04/06/2018   Right shoulder pain 04/06/2018   Cough 05/26/2018   Right forearm fracture 05/22/2020   Close exposure to COVID-19 virus 10/16/2020   Past Medical History:  Diagnosis Date   Anxiety    Breast cancer ([place]) 2001   Depression    Personal history of chemotherapy    Personal history of radiation therapy    Skin cancer 09/2004   Thyroid disease    Constitutional Exam  General appearance: Well nourished, well developed, and well hydrated. In no apparent acute distress Vitals:   09/06/21 1402  BP: 138/76  Pulse: 78  Resp: 16  Temp: 97.7 F (36.5 C)  TempSrc: Temporal  SpO2: 98%  Weight: 147 lb (66.7 kg)  Height: '5\' 3"'  (1.6 m)   BMI Assessment: Estimated body mass index is 26.04 kg/m as calculated from the following:   Height as of this encounter: '5\' 3"'  (1.6 m).   Weight as of this encounter: 147 lb (66.7 kg).  BMI interpretation table: BMI level Category Range association with higher incidence of chronic pain  <18 kg/m2 Underweight   18.5-24.9 kg/m2 Ideal body weight   25-29.9 kg/m2 Overweight Increased incidence by 20%  30-34.9 kg/m2 Obese (Class I) Increased incidence by 68%  35-39.9 kg/m2 Severe obesity (Class II) Increased incidence by 136%  >40 kg/m2 Extreme obesity (Class III) Increased incidence by 254%   Patient's current BMI Ideal Body weight  Body mass index is 26.04 kg/m. Ideal body weight: 52.4 kg (115 lb  8.3 oz) Adjusted ideal body weight: 58.1 kg (128 lb 1.8 oz)   BMI Readings from Last 4 Encounters:  09/06/21 26.04 kg/m  08/27/21 27.44 kg/m  07/10/21 27.51 kg/m  05/11/21 26.81 kg/m   Wt Readings from Last 4 Encounters:  09/06/21 147 lb (66.7 kg)  08/27/21 150 lb (68 kg)  07/10/21 150 lb 6.4 oz (68.2 kg)  05/11/21 146 lb 9.6 oz (66.5 kg)    Psych/Mental status: Alert, oriented x 3 (person, place, & time)       Eyes: PERLA Respiratory: No evidence of acute respiratory distress  Lumbar Spine Area Exam  Skin & Axial Inspection: No masses, redness, or swelling Alignment: Symmetrical Functional ROM: Pain restricted ROM       Stability: No instability detected Muscle Tone/Strength: Functionally intact. No obvious neuro-muscular anomalies detected. Sensory (Neurological): Dermatomal pain pattern right L4-L5 Palpation: No palpable  anomalies       Provocative Tests: Hyperextension/rotation test: Positive for pain with facet loading Lumbar quadrant test (Kemp's test): (+) on the right for foraminal stenosis Lateral bending test: (+) ipsilateral radicular pain, on the right. Positive for right-sided foraminal stenosis.  Gait & Posture Assessment  Ambulation: Unassisted Gait: Relatively normal for age and body habitus Posture: WNL  Lower Extremity Exam    Side: Right lower extremity  Side: Left lower extremity  Stability: No instability observed          Stability: No instability observed          Skin & Extremity Inspection: Skin color, temperature, and hair growth are WNL. No peripheral edema or cyanosis. No masses, redness, swelling, asymmetry, or associated skin lesions. No contractures.  Skin & Extremity Inspection: Skin color, temperature, and hair growth are WNL. No peripheral edema or cyanosis. No masses, redness, swelling, asymmetry, or associated skin lesions. No contractures.  Functional ROM: Pain restricted ROM for all joints of the lower extremity          Functional  ROM: Unrestricted ROM                  Muscle Tone/Strength: Functionally intact. No obvious neuro-muscular anomalies detected.  Muscle Tone/Strength: Functionally intact. No obvious neuro-muscular anomalies detected.  Sensory (Neurological): Dermatomal pain pattern        Sensory (Neurological): Unimpaired        DTR: Patellar: deferred today Achilles: deferred today Plantar: deferred today  DTR: Patellar: deferred today Achilles: deferred today Plantar: deferred today  Palpation: No palpable anomalies  Palpation: No palpable anomalies    Assessment  Primary Diagnosis & Pertinent Problem List: The primary encounter diagnosis was Chronic radicular lumbar pain. Diagnoses of Lumbar radiculopathy, Lumbar facet arthropathy, Lumbar degenerative disc disease, and Chronic pain syndrome were also pertinent to this visit.  Visit Diagnosis (New problems to examiner): 1. Chronic radicular lumbar pain   2. Lumbar radiculopathy   3. Lumbar facet arthropathy   4. Lumbar degenerative disc disease   5. Chronic pain syndrome    Plan of Care (Initial workup plan)  Low back pain with radiation into right buttock and right leg in a dermatomal fashion.  This is likely multifactorial but related to multilevel lumbar degenerative disc disease and facet arthropathy resulting in right-sided neuroforaminal stenosis likely at L4-L5 and L5-S1.  I recommend a physical therapy regimen that she can do at home.  I provided her with instructions of how to safely complete these exercises without causing injury.  These were also sent electronically to her MyChart.  I would like for her to start gabapentin with titration instructions below.  I also recommend a short trial of diclofenac as below.  Patient will follow-up in 4 weeks to assess response.  Future considerations include lumbar MRI and possible lumbar epidural steroid injection and or lumbar medial branch nerve block.  Patient endorsed understanding and was in  agreement with plan.     Pharmacotherapy (current): Medications ordered:  Meds ordered this encounter  Medications   gabapentin (NEURONTIN) 100 MG capsule    Sig: Take 1 capsule (100 mg total) by mouth at bedtime for 7 days, THEN 2 capsules (200 mg total) at bedtime for 7 days, THEN 3 capsules (300 mg total) at bedtime for 16 days. Follow written titration schedule.    Dispense:  69 capsule    Refill:  0    Fill one day early if pharmacy is closed on scheduled  refill date. May substitute for generic if available.   diclofenac (VOLTAREN) 50 MG EC tablet    Sig: Take 1 tablet (50 mg total) by mouth 2 (two) times daily for 15 days.    Dispense:  30 tablet    Refill:  0   Medications administered during this visit: [place] B. Beynon had no medications administered during this visit.   Pharmacological management options:  Opioid Analgesics: Avoid, we will focus on nonopioid-based pain therapies.  Membrane stabilizer:  Trial of gabapentin as above.  Future considerations include Lyrica, Cymbalta.  Muscle relaxant:  Do not recommend long-term but could consider short course of tizanidine/Robaxin  NSAID: Ibuprofen has tried ibuprofen, naproxen with limited response.  Short trial of diclofenac as above.  Other analgesic(s): To be determined at a later time    Discussed TENS unit with patient.  Interventional management options: Ms. Siddique was informed that there is no guarantee that she would be a candidate for interventional therapies. The decision will be based on the results of diagnostic studies, as well as Ms. Barbary's risk profile.  Procedure(s) under consideration:  Lumbar epidural steroid injection Lumbar medial branch nerve block SI joint/piriformis injection    Provider-requested follow-up: Return in about 4 weeks (around 10/04/2021) for Medication Management, in person.  I spent a total of 60 minutes reviewing chart data, face-to-face evaluation with the patient,  counseling and coordination of care as detailed above.   Future Appointments  Date Time Provider [place]  10/04/2021 10:40 AM Gillis Santa, MD [facility] None  10/10/2021  9:30 AM Leone Haven, MD [facility]  08/29/2022  9:45 AM [facility] ADVISOR [facility]    Note by: Gillis Santa, MD Date: 09/06/2021; Time: 2:39 PM

## 2021-09-06 NOTE — Progress Notes (Deleted)
Nursing Pain Medication Assessment:  Safety precautions to be maintained throughout the outpatient stay will include: orient to surroundings, keep bed in low position, maintain call bell within reach at all times, provide assistance with transfer out of bed and ambulation.  Medication Inspection Compliance: {Blank single:19197::"Ms. Cassarino did not comply with our request to bring her pills to be counted. She was reminded that bringing the medication bottles, even when empty, is a requirement.","Pill count conducted under aseptic conditions, in front of the patient. Neither the pills nor the bottle was removed from the patient's sight at any time. Once count was completed pills were immediately returned to the patient in their original bottle."}  Medication: {Blank single:19197::"Multiple medications","Buprenorphine (Suboxone)","Butorphanol (Stadol)","Duragesic patch","Fentanyl patch","Hydrocodone/APAP","Hydromorphone (Dilaudid)","Methadone","Morphine IR","Morphine ER (MSContin)","Oxycodone IR","Oxycodone/APAP","Oxycodone ER (OxyContin)","Oxymorphone (Opana)","Tapentadol (Nucynta)","Tramadol (Ultram)","See above"} Pill/Patch Count: {Blank single:19197::"No pills available to be counted.","*** of *** patches remain","*** of *** pills remain"} Pill/Patch Appearance: {Blank single:19197::"No markings","Markings inconsistent with prescribed medication","Markings consistent with prescribed medication"} Bottle Appearance: {Blank single:19197::"No label. Patient informed that medications must be transported in properly and accurately labeled containers.","Non-pharmacy container. Patient reminded that prescription medications must be kept in original, labeled, pharmacy bottle.","Old prescription bottle. Patient reminded that medications should always be kept in the newest prescription bottle.","No container available. Did not bring bottle(s) to appointment.","Standard pharmacy container. Clearly labeled."} Filled  Date: *** / *** / {Blank single:19197::"2025","2024","2023"} Last Medication intake:  {Blank single:19197::"Ran out of medicine more than 48 hours ago","Day before yesterday","Yesterday","Today"}

## 2021-09-12 NOTE — Telephone Encounter (Signed)
Pt need a refill on clonazePAM sent to [facility]

## 2021-10-04 ENCOUNTER — Ambulatory Visit
Payer: Medicare Other | Attending: Student in an Organized Health Care Education/Training Program | Admitting: Student in an Organized Health Care Education/Training Program

## 2021-10-04 ENCOUNTER — Encounter: Payer: Self-pay | Admitting: Student in an Organized Health Care Education/Training Program

## 2021-10-04 VITALS — BP 147/80 | HR 74 | Temp 96.8°F | Ht 63.0 in | Wt 147.0 lb

## 2021-10-04 DIAGNOSIS — M5416 Radiculopathy, lumbar region: Secondary | ICD-10-CM

## 2021-10-04 DIAGNOSIS — G894 Chronic pain syndrome: Secondary | ICD-10-CM | POA: Diagnosis not present

## 2021-10-04 DIAGNOSIS — G8929 Other chronic pain: Secondary | ICD-10-CM | POA: Diagnosis not present

## 2021-10-04 DIAGNOSIS — M5136 Other intervertebral disc degeneration, lumbar region: Secondary | ICD-10-CM

## 2021-10-04 DIAGNOSIS — M47816 Spondylosis without myelopathy or radiculopathy, lumbar region: Secondary | ICD-10-CM

## 2021-10-04 MED ORDER — GABAPENTIN 300 MG PO CAPS: 300.0000 mg | ORAL_CAPSULE | Freq: Every day | ORAL | 3 refills | Status: DC

## 2021-10-04 MED ORDER — DICLOFENAC SODIUM 50 MG PO TBEC: 50.0000 mg | DELAYED_RELEASE_TABLET | Freq: Two times a day (BID) | ORAL | 1 refills | Status: DC | PRN

## 2021-10-04 NOTE — Progress Notes (Signed)
PROVIDER NOTE: Information contained herein reflects review and annotations entered in association with encounter. Interpretation of such information and data should be left to medically-trained personnel. Information provided to patient can be located elsewhere in the medical record under "Patient Instructions". Document created using STT-dictation technology, any transcriptional errors that may result from process are unintentional.    Patient: Jenny Roberts  Service Category: E/M  Provider: Gillis Santa, MD  DOB: Feb 11, 1945  DOS: 10/04/2021  Referring Provider: Leone Haven, MD  MRN: [id number]  Specialty: Interventional Pain Management  PCP: Leone Haven, MD  Type: Established Patient  Setting: Ambulatory outpatient    Location: Office  Delivery: Face-to-face     HPI  Jenny. Jenny Roberts, a 76 y.o. year old female, is here today because of her Chronic radicular lumbar pain [M54.16, G89.29]. Jenny Roberts primary complain today is Back Pain (Right side) Last encounter: My last encounter with her was on 09/06/2021. Pertinent problems: Jenny Roberts has Right-sided low back pain with right-sided sciatica; Lumbar radiculopathy; Lumbar facet arthropathy; Lumbar degenerative disc disease; and Chronic pain syndrome on their pertinent problem list. Pain Assessment: Severity of Chronic pain is reported as a 4 /10. Location: Back Right/pain radiaities down her right leg to her groin area and down to her feet. Onset: More than a month ago. Quality: Aching, Constant, Throbbing, Nagging. Timing: Constant. Modifying factor(s): Meds and heating pad. Vitals:  height is 5' 3" (1.6 m) and weight is 147 lb (66.7 kg). Her temperature is 96.8 F (36 C) (abnormal). Her blood pressure is 147/80 (abnormal) and her pulse is 74. Her oxygen saturation is 96%.   Reason for encounter: follow-up evaluation.  Overall Jenny Roberts is doing much better and endorsing analgesic and functional benefit from her current  medication regimen. No side effects noted. Will continue as prescribed. If pain worsens, future considerations include lumbar MRI and possible lumbar epidural steroid injection.  HPI from initial clinic visit: Jenny Roberts is a very pleasant 76 year old female who presents with a chief complaint of low back pain with radiation primarily into her right buttock, right lateral thigh and down her medial calf in a dermatomal fashion.  This has been going on for approximately 3 years where it has been particularly aggravating for her.  She states that her husband can no longer drive given his vision issues and that she has to drive and do many of their household chores.  She has tried naproxen, ibuprofen with some response.  She has also tried acetaminophen when her pain is severe.  She denies having tried diclofenac, Celebrex, meloxicam.  She denies having tried gabapentin, Lyrica.  Kidney function within normal limits.  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  She is open to a home directed physician prescribed physical therapy regimen.   ROS  Constitutional: Denies any fever or chills Gastrointestinal: No reported hemesis, hematochezia, vomiting, or acute GI distress Musculoskeletal: Denies any acute onset joint swelling, redness, loss of ROM, or weakness Neurological:  radiating right leg pain, more manageable now gabapentin 300 mg nightly  Medication Review  acetaminophen, cholecalciferol, clonazePAM, diclofenac, gabapentin, levothyroxine, lovastatin, and sertraline  History Review  Allergy: Jenny Roberts is allergic to amoxicillin-pot clavulanate, codeine, and latex. Drug: Jenny Roberts  reports no history of drug use. Alcohol:  reports no history of alcohol use.  Tobacco:  reports that she has never smoked. She has never used  smokeless tobacco. Social: Jenny Roberts  reports that she has never smoked. She has never used smokeless tobacco. She reports that she does not drink alcohol and does not use drugs. Medical:  has a past medical history of Anxiety, Breast cancer ([place]) (2001), Depression, Hyperlipidemia, Nontraumatic rupture of tendon of thumb (06/15/2014), Personal history of chemotherapy, Personal history of radiation therapy, Right forearm fracture (05/22/2020), Skin cancer (09/2004), and Thyroid disease. Surgical: Jenny Roberts  has a past surgical history that includes mole excision (2006); Breast lumpectomy (Right, 08/16/1999); Wrist surgery (Right, 2010); Colonoscopy (2008); Knee surgery (2003); Abdominal hysterectomy (1983); Carpal tunnel release (2016); Colonoscopy with propofol (N/A, 02/16/2018); Breast biopsy (Right, 2001); and Breast excisional biopsy (Right, 1990's). Family: family history includes Brain cancer in her father; Cancer (age of onset: 47) in her maternal grandfather.  Laboratory Chemistry Profile   Renal Lab Results  Component Value Date   BUN 25 (H) 07/10/2021   CREATININE 0.87 07/10/2021   BCR 28 (H) 10/10/2014   GFR 64.79 07/10/2021   GFRAA >60 09/19/2015   GFRNONAA >60 09/19/2015    Hepatic Lab Results  Component Value Date   AST 19 07/10/2021   ALT 16 07/10/2021   ALBUMIN 4.4 07/10/2021   ALKPHOS 80 07/10/2021   HCVAB NEGATIVE 08/16/2016   LIPASE 18 09/17/2015    Electrolytes Lab Results  Component Value Date   NA 139 07/10/2021   K 3.9 07/10/2021   CL 104 07/10/2021   CALCIUM 9.3 07/10/2021   MG 1.8 09/19/2015    Bone Lab Results  Component Value Date   VD25OH 51.80 01/09/2021    Inflammation (CRP: Acute Phase) (ESR: Chronic Phase) Lab Results  Component Value Date   LATICACIDVEN 1.4 09/17/2015         Note: Above Lab results reviewed.  Recent Imaging Review  CT HEAD WO CONTRAST (5MM) CLINICAL DATA:  Head trauma fall  EXAM: CT HEAD WITHOUT  CONTRAST  TECHNIQUE: Contiguous axial images were obtained from the base of the skull through the vertex without intravenous contrast.  RADIATION DOSE REDUCTION: This exam was performed according to the departmental dose-optimization program which includes automated exposure control, adjustment of the mA and/or kV according to patient size and/or use of iterative reconstruction technique.  COMPARISON:  CT brain 01/16/2008  FINDINGS: Brain: No acute territorial infarction, hemorrhage or intracranial mass. Moderate atrophy. Minimal chronic small vessel ischemic change of the white matter. Nonenlarged ventricles  Vascular: No hyperdense vessels.  Carotid vascular calcification  Skull: Normal. Negative for fracture or focal lesion.  Sinuses/Orbits: No acute finding.  Mucosal thickening in the sinuses  Other: None  IMPRESSION: 1. No CT evidence for acute intracranial abnormality. 2. Atrophy  Electronically Signed   By: Donavan Foil M.D.   On: 06/24/2021 21:41 DG Knee Complete 4 Views Left CLINICAL DATA:  Status post fall.  EXAM: LEFT KNEE - COMPLETE 4+ VIEW  COMPARISON:  February 24, 2013  FINDINGS: No evidence of an acute fracture or dislocation. Radiopaque surgical screws and cerclage wire are again seen within the left patella. A small joint effusion is suspected.  IMPRESSION: 1. Small joint effusion without evidence of an acute osseous abnormality. 2. Stable postoperative changes involving the left patella.  Electronically Signed   By: Virgina Norfolk M.D.   On: 06/24/2021 21:40 Note: Reviewed        Physical Exam  General appearance: Well nourished, well developed, and well hydrated.  In no apparent acute distress Mental status: Alert, oriented x 3 (person, place, & time)       Respiratory: No evidence of acute respiratory distress Eyes: PERLA Vitals: BP (!) 147/80   Pulse 74   Temp (!) 96.8 F (36 C)   Ht 5' 3" (1.6 m)   Wt 147 lb (66.7 kg)   SpO2  96%   BMI 26.04 kg/m  BMI: Estimated body mass index is 26.04 kg/m as calculated from the following:   Height as of this encounter: 5' 3" (1.6 m).   Weight as of this encounter: 147 lb (66.7 kg). Ideal: Ideal body weight: 52.4 kg (115 lb 8.3 oz) Adjusted ideal body weight: 58.1 kg (128 lb 1.8 oz)    Lumbar Spine Area Exam  Skin & Axial Inspection: No masses, redness, or swelling Alignment: Symmetrical Functional ROM: Pain restricted ROM       Stability: No instability detected Muscle Tone/Strength: Functionally intact. No obvious neuro-muscular anomalies detected. Sensory (Neurological): Dermatomal pain pattern right L4-L5 Palpation: No palpable anomalies       Provocative Tests: Hyperextension/rotation test: Positive for pain with facet loading Lumbar quadrant test (Kemp's test): (+) on the right for foraminal stenosis, more manageable now Lateral bending test: (+) ipsilateral radicular pain, on the right. Positive for right-sided foraminal stenosis.   Gait & Posture Assessment  Ambulation: Unassisted Gait: Relatively normal for age and body habitus Posture: WNL  Lower Extremity Exam      Side: Right lower extremity   Side: Left lower extremity  Stability: No instability observed           Stability: No instability observed          Skin & Extremity Inspection: Skin color, temperature, and hair growth are WNL. No peripheral edema or cyanosis. No masses, redness, swelling, asymmetry, or associated skin lesions. No contractures.   Skin & Extremity Inspection: Skin color, temperature, and hair growth are WNL. No peripheral edema or cyanosis. No masses, redness, swelling, asymmetry, or associated skin lesions. No contractures.  Functional ROM: Pain restricted ROM for all joints of the lower extremity           Functional ROM: Unrestricted ROM                  Muscle Tone/Strength: Functionally intact. No obvious neuro-muscular anomalies detected.   Muscle Tone/Strength: Functionally  intact. No obvious neuro-muscular anomalies detected.  Sensory (Neurological): Dermatomal pain pattern         Sensory (Neurological): Unimpaired        DTR: Patellar: deferred today Achilles: deferred today Plantar: deferred today   DTR: Patellar: deferred today Achilles: deferred today Plantar: deferred today  Palpation: No palpable anomalies   Palpation: No palpable anomalies     Assessment   Diagnosis Status  1. Chronic radicular lumbar pain   2. Lumbar radiculopathy   3. Lumbar facet arthropathy   4. Lumbar degenerative disc disease   5. Chronic pain syndrome    Controlled Controlled Controlled   Updated Problems: Problem  Lumbar Radiculopathy  Lumbar Facet Arthropathy  Lumbar Degenerative Disc Disease  Chronic Pain Syndrome  Right-Sided Low Back Pain With Right-Sided Sciatica    Plan of Care    Jenny Roberts has a current medication list which includes the following long-term medication(s): clonazepam, gabapentin, levothyroxine, lovastatin, and sertraline.  Pharmacotherapy (Medications Ordered): Meds ordered this encounter  Medications   gabapentin (NEURONTIN) 300 MG capsule    Sig: Take 1 capsule (  300 mg total) by mouth at bedtime.    Dispense:  30 capsule    Refill:  3   diclofenac (VOLTAREN) 50 MG EC tablet    Sig: Take 1 tablet (50 mg total) by mouth 2 (two) times daily between meals as needed.    Dispense:  30 tablet    Refill:  1   Future considerations include lumbar MRI and possible lumbar epidural steroid injection and or lumbar medial branch nerve block.  Patient endorsed understanding and was in agreement with plan.     Follow-up plan:   Return in about 17 weeks (around 01/31/2022) for Medication Management, in person.    I discussed the assessment and treatment plan with the patient. The patient was provided an opportunity to ask questions and all were answered. The patient agreed with the plan and demonstrated an understanding of the  instructions.  Patient advised to call back or seek an in-person evaluation if the symptoms or condition worsens.  Duration of encounter: 61mnutes.  Total time on encounter, as per AMA guidelines included both the face-to-face and non-face-to-face time personally spent by the physician and/or other qualified health care professional(s) on the day of the encounter (includes time in activities that require the physician or other qualified health care professional and does not include time in activities normally performed by clinical staff). Physician's time may include the following activities when performed: preparing to see the patient (eg, review of tests, pre-charting review of records) obtaining and/or reviewing separately obtained history performing a medically appropriate examination and/or evaluation counseling and educating the patient/family/caregiver ordering medications, tests, or procedures referring and communicating with other health care professionals (when not separately reported) documenting clinical information in the electronic or other health record independently interpreting results (not separately reported) and communicating results to the patient/ family/caregiver care coordination (not separately reported)  Note by: BGillis Santa MD Date: 10/04/2021; Time: 10:53 AM

## 2021-10-04 NOTE — Patient Instructions (Signed)

## 2021-10-04 NOTE — Progress Notes (Signed)
Safety precautions to be maintained throughout the outpatient stay will include: orient to surroundings, keep bed in low position, maintain call bell within reach at all times, provide assistance with transfer out of bed and ambulation.  

## 2021-10-10 ENCOUNTER — Ambulatory Visit (INDEPENDENT_AMBULATORY_CARE_PROVIDER_SITE_OTHER): Payer: Medicare Other | Admitting: Family Medicine

## 2021-10-10 ENCOUNTER — Encounter: Payer: Self-pay | Admitting: Family Medicine

## 2021-10-10 VITALS — BP 120/70 | HR 75 | Temp 98.6°F | Ht 63.0 in | Wt 151.0 lb

## 2021-10-10 DIAGNOSIS — F411 Generalized anxiety disorder: Secondary | ICD-10-CM | POA: Diagnosis not present

## 2021-10-10 DIAGNOSIS — Z1231 Encounter for screening mammogram for malignant neoplasm of breast: Secondary | ICD-10-CM

## 2021-10-10 DIAGNOSIS — M810 Age-related osteoporosis without current pathological fracture: Secondary | ICD-10-CM

## 2021-10-10 DIAGNOSIS — Z23 Encounter for immunization: Secondary | ICD-10-CM

## 2021-10-10 DIAGNOSIS — G8929 Other chronic pain: Secondary | ICD-10-CM

## 2021-10-10 DIAGNOSIS — M5441 Lumbago with sciatica, right side: Secondary | ICD-10-CM | POA: Diagnosis not present

## 2021-10-10 LAB — VITAMIN D 25 HYDROXY (VIT D DEFICIENCY, FRACTURES): VITD: 56.4 ng/mL (ref 30.00–100.00)

## 2021-10-10 MED ORDER — ALENDRONATE SODIUM 70 MG PO TABS: 70.0000 mg | ORAL_TABLET | ORAL | 11 refills | Status: DC

## 2021-10-10 NOTE — Assessment & Plan Note (Signed)
We will transition her over to Fosamax 70 mg weekly and see how she tolerates this.  We will check a vitamin D today to make sure this is still in the normal range.

## 2021-10-10 NOTE — Progress Notes (Signed)
[name], MD Phone: [phone]  Jenny Roberts is a 76 y.o. female who presents today for follow-up.  Anxiety: Patient notes this is pretty good.  She has some stressors with her husband having macular degeneration.  She takes clonazepam at night and it does not make her drowsy.  She is also on Zoloft.  No depression.  No SI.  Osteoporosis: She has been off Prolia and does note her achiness has improved though she also notes this did improve when she was started on gabapentin for her back.  She wonders about alternatives to the Prolia.  Chronic back pain: Patient saw pain management.  They started her on diclofenac and gabapentin.  She is not taking the diclofenac on a daily basis.  She notes these things have been helpful.  She is able to function better now.  Social History   Tobacco Use  Smoking Status Never  Smokeless Tobacco Never    Current Outpatient Medications on File Prior to Visit  Medication Sig Dispense Refill   acetaminophen (TYLENOL) 500 MG tablet Take by mouth.     cholecalciferol (VITAMIN D) 1000 units tablet Take 1,000 Units by mouth 2 (two) times daily.     clonazePAM (KLONOPIN) 0.5 MG tablet TAKE 1 TABLET(0.5 MG) BY MOUTH DAILY 30 tablet 1   diclofenac (VOLTAREN) 50 MG EC tablet Take 1 tablet (50 mg total) by mouth 2 (two) times daily between meals as needed. 30 tablet 1   gabapentin (NEURONTIN) 300 MG capsule Take 1 capsule (300 mg total) by mouth at bedtime. 30 capsule 3   levothyroxine (SYNTHROID) 75 MCG tablet TAKE 1 TABLET(75 MCG) BY MOUTH DAILY 90 tablet 1   lovastatin (MEVACOR) 40 MG tablet TAKE 1 TABLET(40 MG) BY MOUTH DAILY 90 tablet 3   sertraline (ZOLOFT) 100 MG tablet TAKE 1 TABLET BY MOUTH DAILY 90 tablet 1   No current facility-administered medications on file prior to visit.     ROS see history of present illness  Objective  Physical Exam Vitals:   10/10/21 1012  BP: 120/70  Pulse: 75  Temp: 98.6 F (37 C)  SpO2: 96%    BP  Readings from Last 3 Encounters:  10/10/21 120/70  10/04/21 (!) 147/80  09/06/21 138/76   Wt Readings from Last 3 Encounters:  10/10/21 151 lb (68.5 kg)  10/04/21 147 lb (66.7 kg)  09/06/21 147 lb (66.7 kg)    Physical Exam Constitutional:      General: She is not in acute distress.    Appearance: She is not diaphoretic.  Cardiovascular:     Rate and Rhythm: Normal rate and regular rhythm.     Heart sounds: Normal heart sounds.  Pulmonary:     Effort: Pulmonary effort is normal.     Breath sounds: Normal breath sounds.  Skin:    General: Skin is warm and dry.  Neurological:     Mental Status: She is alert.      Assessment/Plan: Please see individual problem list.  Problem List Items Addressed This Visit     Anxiety state (Chronic)    Adequately controlled.  She will continue Zoloft 100 mg daily and clonazepam 0.5 mg nightly.      Osteoporosis - Primary (Chronic)    We will transition her over to Fosamax 70 mg weekly and see how she tolerates this.  We will check a vitamin D today to make sure this is still in the normal range.      Relevant Medications  alendronate (FOSAMAX) 70 MG tablet   Other Relevant Orders   Vitamin D (25 hydroxy)   Right-sided low back pain with right-sided sciatica (Chronic)    Much improved.  She will continue to see pain management.  Discussed if she has to start diclofenac on a daily basis she needs to let us know so we can monitor her kidney function.      Other Visit Diagnoses     Encounter for screening mammogram for malignant neoplasm of breast       Relevant Orders   MM 3D SCREEN BREAST BILATERAL   Need for shingles vaccine       Need for immunization against influenza       Relevant Orders   Flu Vaccine QUAD High Dose(Fluad) (Completed)        Health Maintenance: patient will call to schedule mammogram closer to her due date in November.   Return in about 3 months (around 01/09/2022) for anxiety/klonopin  f/u.   [name], MD [place]

## 2021-10-10 NOTE — Assessment & Plan Note (Signed)
Much improved.  She will continue to see pain management.  Discussed if she has to start diclofenac on a daily basis she needs to let us know so we can monitor her kidney function.

## 2021-10-10 NOTE — Assessment & Plan Note (Signed)
Adequately controlled. She will continue Zoloft 100 mg daily and clonazepam 0.5 mg nightly.  

## 2021-10-10 NOTE — Patient Instructions (Signed)
Nice to see you. You can start on Fosamax.  We will let you know what your vitamin D reveals.

## 2021-11-12 ENCOUNTER — Other Ambulatory Visit: Payer: Self-pay | Admitting: Family Medicine

## 2021-12-04 ENCOUNTER — Other Ambulatory Visit: Payer: Self-pay | Admitting: Family Medicine

## 2021-12-04 DIAGNOSIS — E039 Hypothyroidism, unspecified: Secondary | ICD-10-CM

## 2021-12-11 ENCOUNTER — Ambulatory Visit
Admission: RE | Admit: 2021-12-11 | Discharge: 2021-12-11 | Disposition: A | Discharge status: Home / Self Care | Payer: Medicare Other | Source: Ambulatory Visit | Attending: Family Medicine | Admitting: Family Medicine

## 2021-12-11 DIAGNOSIS — Z1231 Encounter for screening mammogram for malignant neoplasm of breast: Secondary | ICD-10-CM | POA: Insufficient documentation

## 2022-01-07 ENCOUNTER — Telehealth: Payer: Self-pay | Admitting: Family Medicine

## 2022-01-07 ENCOUNTER — Encounter: Payer: Self-pay | Admitting: Family Medicine

## 2022-01-07 NOTE — Telephone Encounter (Signed)
Prescription Request  01/07/2022  Is this a "Controlled Substance" medicine? Yes  LOV: 10/10/2021  What is the name of the medication or equipment? sertraline (ZOLOFT) 100 MG tablet  Have you contacted your pharmacy to request a refill? No   Which pharmacy would you like this sent to?  [id number] - [place], [place] - [address] [place] [address] Phone: [phone] Fax: [phone]    Patient notified that their request is being sent to the clinical staff for review and that they should receive a response within 2 business days.   Please advise at Mobile [phone] (mobile)

## 2022-01-08 ENCOUNTER — Other Ambulatory Visit: Payer: Self-pay | Admitting: Family Medicine

## 2022-01-09 ENCOUNTER — Ambulatory Visit: Payer: Medicare Other | Admitting: Family Medicine

## 2022-01-09 NOTE — Telephone Encounter (Signed)
This has already been refilled

## 2022-01-15 ENCOUNTER — Other Ambulatory Visit: Payer: Self-pay | Admitting: Family Medicine

## 2022-01-16 NOTE — Telephone Encounter (Signed)
LOV: 10/10/21  NOV: 02/11/22

## 2022-01-21 HISTORY — PX: CATARACT EXTRACTION W/ INTRAOCULAR LENS  IMPLANT, BILATERAL: SHX1307

## 2022-01-22 ENCOUNTER — Encounter: Payer: Self-pay | Admitting: Student in an Organized Health Care Education/Training Program

## 2022-01-22 ENCOUNTER — Ambulatory Visit
Payer: Medicare Other | Attending: Student in an Organized Health Care Education/Training Program | Admitting: Student in an Organized Health Care Education/Training Program

## 2022-01-22 VITALS — BP 129/69 | HR 82 | Temp 97.0°F | Resp 16 | Ht 62.0 in | Wt 149.0 lb

## 2022-01-22 DIAGNOSIS — G8929 Other chronic pain: Secondary | ICD-10-CM | POA: Diagnosis not present

## 2022-01-22 DIAGNOSIS — G894 Chronic pain syndrome: Secondary | ICD-10-CM | POA: Diagnosis not present

## 2022-01-22 DIAGNOSIS — M5416 Radiculopathy, lumbar region: Secondary | ICD-10-CM | POA: Insufficient documentation

## 2022-01-22 DIAGNOSIS — M5136 Other intervertebral disc degeneration, lumbar region: Secondary | ICD-10-CM | POA: Insufficient documentation

## 2022-01-22 MED ORDER — GABAPENTIN 300 MG PO CAPS: 300.0000 mg | ORAL_CAPSULE | Freq: Two times a day (BID) | ORAL | 3 refills | Status: DC

## 2022-01-22 NOTE — Progress Notes (Signed)
Safety precautions to be maintained throughout the outpatient stay will include: orient to surroundings, keep bed in low position, maintain call bell within reach at all times, provide assistance with transfer out of bed and ambulation.  

## 2022-01-22 NOTE — Progress Notes (Signed)
PROVIDER NOTE: Information contained herein reflects review and annotations entered in association with encounter. Interpretation of such information and data should be left to medically-trained personnel. Information provided to patient can be located elsewhere in the medical record under "Patient Instructions". Document created using STT-dictation technology, any transcriptional errors that may result from process are unintentional.    Patient: Jenny Roberts  Service Category: E/M  Provider: Gillis Santa, MD  DOB: July 29, 1945  DOS: 01/22/2022  Referring Provider: Leone Haven, MD  MRN: [id number]  Specialty: Interventional Pain Management  PCP: Leone Haven, MD  Type: Established Patient  Setting: Ambulatory outpatient    Location: Office  Delivery: Face-to-face     HPI  [name], a 77 y.o. year old female, is here today because of her Chronic radicular lumbar pain [M54.16, G89.29]. [name] primary complain today is Back Pain (Lumbar right ) Last encounter: My last encounter with her was on 10/04/2021. Pertinent problems: [name] has Right-sided low back pain with right-sided sciatica; Chronic radicular lumbar pain; Lumbar facet arthropathy; Lumbar degenerative disc disease; and Chronic pain syndrome on their pertinent problem list. Pain Assessment: Severity of Chronic pain is reported as a 8 /10. Location: Back Lower, Right/into right hip and down right leg to the foot. Onset: More than a month ago. Quality: Discomfort, Constant, Aching, Sharp. Timing: Constant. Modifying factor(s): tylenol, gabapentin helped in the beginning, now she is unable to tell if is helping or not. topical analgesics. Vitals:  height is 5' 2" (1.575 m) and weight is 149 lb (67.6 kg). Her temporal temperature is 97 F (36.1 C) (abnormal). Her blood pressure is 129/69 and her pulse is 82. Her respiration is 16 and oxygen saturation is 97%.  BMI: Estimated body mass index is 27.25 kg/m as  calculated from the following:   Height as of this encounter: 5' 2" (1.575 m).   Weight as of this encounter: 149 lb (67.6 kg).  Reason for encounter: medication management.   Patient presents today for increased lumbar spine pain on the right side which radiates down into her right leg, right buttock and down to her right foot.  Present in a dermatomal fashion. State that she has trouble sleeping at night and has to use a shopping cart for support at the grocery store. Also endorsing weakness of right leg.  She was initially getting relief with acetaminophen and gabapentin but is no longer.  10/04/21 Overall Ms Gaylan Roberts is doing much better and endorsing analgesic and functional benefit from her current medication regimen. No side effects noted. Will continue as prescribed. If pain worsens, future considerations include lumbar MRI and possible lumbar epidural steroid injection.   HPI from initial clinic visit 09/06/21: Jenny Roberts is a very pleasant 77 year old female who presents with a chief complaint of low back pain with radiation primarily into her right buttock, right lateral thigh and down her medial calf in a dermatomal fashion.  This has been going on for approximately 3 years where it has been particularly aggravating for her.  She states that her husband can no longer drive given his vision issues and that she has to drive and do many of their household chores.  She has tried naproxen, ibuprofen with some response.  She has also tried acetaminophen when her pain is severe.  She denies having tried diclofenac, Celebrex, meloxicam.  She denies having tried gabapentin, Lyrica.  Kidney function within normal limits.  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  She is open to a home directed physician prescribed physical therapy  regimen.    ROS  Constitutional: Denies any fever or chills Gastrointestinal: No reported hemesis, hematochezia, vomiting, or acute GI distress Musculoskeletal:  Lumbar spine pain, right sided with radiation into her right buttock, right lateral thigh, right leg Neurological: No reported episodes of acute onset apraxia, aphasia, dysarthria, agnosia, amnesia, paralysis, loss of coordination, or loss of consciousness  Medication Review  acetaminophen, alendronate, cholecalciferol, clonazePAM, diclofenac, gabapentin, levothyroxine, lovastatin, and sertraline  History Review  Allergy: Jenny Roberts is allergic to amoxicillin-pot clavulanate, codeine, and latex. Drug: Jenny Roberts  reports no history of drug use. Alcohol:  reports no history of alcohol use. Tobacco:  reports that she has never smoked. She has never used smokeless tobacco. Social: [name]  reports that she has never smoked. She has never used smokeless tobacco. She reports that she does not drink alcohol and does not use drugs. Medical:  has a past medical history of Anxiety, Breast cancer ([place]) (2001), Depression, Hyperlipidemia, Nontraumatic rupture of tendon of thumb (06/15/2014), Personal history of chemotherapy, Personal history of radiation therapy, Right forearm fracture (05/22/2020), Skin cancer (09/2004), and Thyroid disease. Surgical: Jenny Roberts  has a past surgical history that includes mole excision (2006); Breast lumpectomy (Right, 08/16/1999); Wrist surgery (Right, 2010); Colonoscopy (2008); Knee surgery (2003); Abdominal hysterectomy (1983); Carpal tunnel release (2016); Colonoscopy with propofol (N/A, 02/16/2018); Breast biopsy (Right, 2001); and Breast excisional biopsy (Right, 1990's). Family: family history includes Brain cancer in her father; Cancer (age of onset: 7) in her maternal grandfather.  Laboratory Chemistry Profile   Renal Lab Results  Component Value Date   BUN 25 (H) 07/10/2021   CREATININE 0.87  07/10/2021   BCR 28 (H) 10/10/2014   GFR 64.79 07/10/2021   GFRAA >60 09/19/2015   GFRNONAA >60 09/19/2015    Hepatic Lab Results  Component Value Date   AST 19 07/10/2021   ALT 16 07/10/2021   ALBUMIN 4.4 07/10/2021   ALKPHOS 80 07/10/2021   HCVAB NEGATIVE 08/16/2016   LIPASE 18 09/17/2015    Electrolytes Lab Results  Component Value Date   NA 139 07/10/2021   K 3.9 07/10/2021   CL 104 07/10/2021   CALCIUM 9.3 07/10/2021   MG 1.8 09/19/2015    Bone Lab Results  Component Value Date   VD25OH 56.40 10/10/2021    Inflammation (CRP: Acute Phase) (ESR: Chronic Phase) Lab Results  Component Value Date   LATICACIDVEN 1.4 09/17/2015         Note: Above Lab results reviewed.  Recent Imaging Review  MM 3D SCREEN BREAST BILATERAL CLINICAL DATA:  Screening.  EXAM: DIGITAL SCREENING BILATERAL MAMMOGRAM WITH TOMOSYNTHESIS AND CAD  TECHNIQUE: Bilateral screening digital craniocaudal and mediolateral oblique mammograms were obtained. Bilateral screening digital breast tomosynthesis was performed. The images were evaluated with computer-aided detection.  COMPARISON:  Previous exam(s).  ACR Breast Density Category c: The breast tissue is heterogeneously dense, which may obscure small masses.  FINDINGS: There are no findings suspicious for malignancy.  IMPRESSION: No mammographic evidence of malignancy. A result letter of this screening mammogram will be mailed directly to the patient.  RECOMMENDATION: Screening mammogram in one year. (Code:[id number]-01Y)  BI-RADS CATEGORY  1: Negative.  Electronically Signed   By: Evangeline Dakin M.D.   On: 12/12/2021 15:42 CLINICAL  DATA:  Low back pain radiating down right leg x 2 months.   EXAM: LUMBAR SPINE - COMPLETE 4+ VIEW   COMPARISON:  None.   FINDINGS: Five lumbar type vertebra. There is no acute fracture or subluxation of the lumbar spine. The bones are osteopenic. There is severe multilevel degenerative changes  with disc space narrowing and spurring. Moderate dextroscoliosis centered at L2-L3. The visualized posterior elements are intact. The soft tissues are unremarkable.   IMPRESSION: 1. No acute fracture or subluxation. 2. Severe multilevel degenerative changes and moderate dextroscoliosis.     Electronically Signed   By: Anner Crete M.D.   On: 05/14/2021 14:09   Note: Reviewed        Physical Exam  General appearance: Well nourished, well developed, and well hydrated. In no apparent acute distress Mental status: Alert, oriented x 3 (person, place, & time)       Respiratory: No evidence of acute respiratory distress Eyes: PERLA Vitals: BP 129/69 (BP Location: Left Arm, Patient Position: Sitting, Cuff Size: Normal)   Pulse 82   Temp (!) 97 F (36.1 C) (Temporal)   Resp 16   Ht 5' 2" (1.575 m)   Wt 149 lb (67.6 kg)   SpO2 97%   BMI 27.25 kg/m  BMI: Estimated body mass index is 27.25 kg/m as calculated from the following:   Height as of this encounter: 5' 2" (1.575 m).   Weight as of this encounter: 149 lb (67.6 kg). Ideal: Ideal body weight: 50.1 kg (110 lb 7.2 oz) Adjusted ideal body weight: 57.1 kg (125 lb 13.9 oz)  Lumbar Spine Area Exam  Skin & Axial Inspection: No masses, redness, or swelling Alignment: Symmetrical Functional ROM: Pain restricted ROM, right       Stability: No instability detected Muscle Tone/Strength: Functionally intact. No obvious neuro-muscular anomalies detected. Sensory (Neurological): Dermatomal pain pattern right L4-L5 Palpation: No palpable anomalies       Provocative Tests: Hyperextension/rotation test: Positive for pain with facet loading Lumbar quadrant test (Kemp's test): (+) on the right for foraminal stenosis Lateral bending test: (+) ipsilateral radicular pain, on the right. Positive for right-sided foraminal stenosis.   Gait & Posture Assessment  Ambulation: Unassisted Gait: Relatively normal for age and body  habitus Posture: WNL  Lower Extremity Exam      Side: Right lower extremity   Side: Left lower extremity  Stability: No instability observed           Stability: No instability observed          Skin & Extremity Inspection: Skin color, temperature, and hair growth are WNL. No peripheral edema or cyanosis. No masses, redness, swelling, asymmetry, or associated skin lesions. No contractures.   Skin & Extremity Inspection: Skin color, temperature, and hair growth are WNL. No peripheral edema or cyanosis. No masses, redness, swelling, asymmetry, or associated skin lesions. No contractures.  Functional ROM: Pain restricted ROM for all joints of the lower extremity           Functional ROM: Unrestricted ROM                  Muscle Tone/Strength: Functionally intact. No obvious neuro-muscular anomalies detected.   Muscle Tone/Strength: Functionally intact. No obvious neuro-muscular anomalies detected.  Sensory (Neurological): Dermatomal pain pattern         Sensory (Neurological): Unimpaired        DTR: Patellar: deferred today Achilles: deferred today Plantar: deferred today   DTR: Patellar: deferred today Achilles:  deferred today Plantar: deferred today  Palpation: No palpable anomalies   Palpation: No palpable anomalies     Assessment   Diagnosis Status  1. Chronic radicular lumbar pain   2. Lumbar radiculopathy   3. Lumbar degenerative disc disease   4. Chronic pain syndrome    Deteriorating Deteriorating Persistent   Updated Problems: Problem  Chronic Radicular Lumbar Pain    Plan of Care    1. Chronic radicular lumbar pain -Recommend patient increase gabapentin for worsening right lumbar radicular pain.  Will also order MRI and plan for right lumbar epidural steroid injection with p.o. Valium. -Of note, she has tried to perform home physical therapy exercises that I provided to her which have not helped.  I did provide her with additional resources for sciatica rehab. -  gabapentin (NEURONTIN) 300 MG capsule; Take 1 capsule (300 mg total) by mouth 2 (two) times daily.  Dispense: 60 capsule; Refill: 3 - MR LUMBAR SPINE WO CONTRAST; Future - Lumbar Epidural Injection; Future  2. Lumbar radiculopathy - Recommend patient increase gabapentin for worsening right lumbar radicular pain.  Will also order MRI and plan for right lumbar epidural steroid injection with p.o. Valium. - gabapentin (NEURONTIN) 300 MG capsule; Take 1 capsule (300 mg total) by mouth 2 (two) times daily.  Dispense: 60 capsule; Refill: 3 - MR LUMBAR SPINE WO CONTRAST; Future - Lumbar Epidural Injection; Future  3. Chronic pain syndrome - gabapentin (NEURONTIN) 300 MG capsule; Take 1 capsule (300 mg total) by mouth 2 (two) times daily.  Dispense: 60 capsule; Refill: 3 - MR LUMBAR SPINE WO CONTRAST; Future - Lumbar Epidural Injection; Future   Pharmacotherapy (Medications Ordered): Meds ordered this encounter  Medications   gabapentin (NEURONTIN) 300 MG capsule    Sig: Take 1 capsule (300 mg total) by mouth 2 (two) times daily.    Dispense:  60 capsule    Refill:  3   Orders:  Orders Placed This Encounter  Procedures   Lumbar Epidural Injection    Standing Status:   Future    Standing Expiration Date:   04/23/2022    Scheduling Instructions:     Procedure: Interlaminar Lumbar Epidural Steroid injection (LESI)            Laterality: RIGHT L4/5     Sedation: PO Valium     Timeframe: ASAA    Order Specific Question:   Where will this procedure be performed?    Answer:   [facility]   MR LUMBAR SPINE WO CONTRAST    Patient presents with axial pain with possible radicular component. Please assist [place] in identifying specific level(s) and laterality of any additional findings such as: 1. Facet (Zygapophyseal) joint DJD (Hypertrophy, space narrowing, subchondral sclerosis, and/or osteophyte formation) 2. DDD and/or IVDD (Loss of disc height, desiccation, gas patterns, osteophytes,  endplate sclerosis, or "Black disc disease") 3. Pars defects 4. Spondylolisthesis, spondylosis, and/or spondyloarthropathies (include Degree/Grade of displacement in mm) (stability) 5. Vertebral body Fractures (acute/chronic) (state percentage of collapse) 6. Demineralization (osteopenia/osteoporotic) 7. Bone pathology 8. Foraminal narrowing  9. Surgical changes 10. Central, Lateral Recess, and/or Foraminal Stenosis (include AP diameter of stenosis in mm) 11. Surgical changes (hardware type, status, and presence of fibrosis) 12. Modic Type Changes (MRI only) 13. IVDD (Disc bulge, protrusion, herniation, extrusion) (Level, laterality, extent)    Standing Status:   Future    Standing Expiration Date:   02/22/2022    Scheduling Instructions:     Imaging must be done as  soon as possible. Inform patient that order will expire within 30 days and I will not renew it.    Order Specific Question:   What is the patient's sedation requirement?    Answer:   No Sedation    Order Specific Question:   Does the patient have a pacemaker or implanted devices?    Answer:   No    Order Specific Question:   Preferred imaging location?    Answer:   [facility] (table limit-350lbs)    Order Specific Question:   Call Results- Best Contact Number?    Answer:   [phone] ([facility])    Order Specific Question:   Radiology Contrast Protocol - do NOT remove file path    Answer:   [email or web address]   Follow-up plan:   Return in about 22 days (around 02/13/2022) for Right L4-5 ESI, in clinic (PO Valium) (review L-MRI with her before hand).    Recent Visits No visits were found meeting these conditions. Showing recent visits within past 90 days and meeting all other requirements Today's Visits Date Type Provider Dept  01/22/22 Office Visit Gillis Santa, MD [facility]  Showing today's visits and meeting all other requirements Future Appointments No  visits were found meeting these conditions. Showing future appointments within next 90 days and meeting all other requirements  I discussed the assessment and treatment plan with the patient. The patient was provided an opportunity to ask questions and all were answered. The patient agreed with the plan and demonstrated an understanding of the instructions.  Patient advised to call back or seek an in-person evaluation if the symptoms or condition worsens.  Duration of encounter: 52mnutes.  Total time on encounter, as per AMA guidelines included both the face-to-face and non-face-to-face time personally spent by the physician and/or other qualified health care professional(s) on the day of the encounter (includes time in activities that require the physician or other qualified health care professional and does not include time in activities normally performed by clinical staff). Physician's time may include the following activities when performed: Preparing to see the patient (e.g., pre-charting review of records, searching for previously ordered imaging, lab work, and nerve conduction tests) Review of prior analgesic pharmacotherapies. Reviewing PMP Interpreting ordered tests (e.g., lab work, imaging, nerve conduction tests) Performing post-procedure evaluations, including interpretation of diagnostic procedures Obtaining and/or reviewing separately obtained history Performing a medically appropriate examination and/or evaluation Counseling and educating the patient/family/caregiver Ordering medications, tests, or procedures Referring and communicating with other health care professionals (when not separately reported) Documenting clinical information in the electronic or other health record Independently interpreting results (not separately reported) and communicating results to the patient/ family/caregiver Care coordination (not separately reported)  Note by: BGillis Santa MD Date:  01/22/2022; Time: 11:30 AM

## 2022-01-22 NOTE — Patient Instructions (Signed)

## 2022-02-01 DIAGNOSIS — M5416 Radiculopathy, lumbar region: Secondary | ICD-10-CM | POA: Diagnosis not present

## 2022-02-01 DIAGNOSIS — M1611 Unilateral primary osteoarthritis, right hip: Secondary | ICD-10-CM | POA: Diagnosis not present

## 2022-02-01 DIAGNOSIS — M7061 Trochanteric bursitis, right hip: Secondary | ICD-10-CM | POA: Diagnosis not present

## 2022-02-11 ENCOUNTER — Ambulatory Visit (INDEPENDENT_AMBULATORY_CARE_PROVIDER_SITE_OTHER): Payer: Medicare Other | Admitting: Family Medicine

## 2022-02-11 ENCOUNTER — Encounter: Payer: Self-pay | Admitting: Family Medicine

## 2022-02-11 VITALS — BP 130/80 | HR 74 | Temp 98.2°F | Ht 62.0 in | Wt 149.0 lb

## 2022-02-11 DIAGNOSIS — F411 Generalized anxiety disorder: Secondary | ICD-10-CM

## 2022-02-11 DIAGNOSIS — M810 Age-related osteoporosis without current pathological fracture: Secondary | ICD-10-CM | POA: Diagnosis not present

## 2022-02-11 DIAGNOSIS — R400 Somnolence: Secondary | ICD-10-CM

## 2022-02-11 DIAGNOSIS — M5441 Lumbago with sciatica, right side: Secondary | ICD-10-CM

## 2022-02-11 DIAGNOSIS — G8929 Other chronic pain: Secondary | ICD-10-CM

## 2022-02-11 MED ORDER — LOVASTATIN 40 MG PO TABS: ORAL_TABLET | ORAL | 3 refills | Status: DC

## 2022-02-11 MED ORDER — CLONAZEPAM 0.5 MG PO TABS: ORAL_TABLET | ORAL | 0 refills | Status: DC

## 2022-02-11 NOTE — Progress Notes (Signed)
[name], MD Phone: [phone]  Jenny Roberts is a 76 y.o. female who presents today for f/u.  Anxiety: patient reports that the klonopin 0.'5mg'$  nightly is effective for her anxiety. She states that it helps her calm down and also sleep better at night. She denies any excessive drowsiness after taking the klonopin.   Osteoporosis: patient continues to take her Fosamax weekly '70mg'$ . She denies any side effects from the medication and also continues to take her vitamin D supplement. No recent falls or injuries.   Back pain: Patient has switched pain doctors for a second opinion. Last week she had a right hip injection by orthopedics and she has had good relief of pain from that. Tomorrow she will see PM&R for further recommendations and start physical therapy in February.   Drowsiness on gabapentin: patient was prescribed gabapentin '300mg'$  twice daily. She states that she becomes drowsy after taking the AM dose of gabapentin. She wonders if she can reduce the dose to not be so drowsy during the day. She is pretty certain that this is the only medication contributing to her daytime drowsiness.   Social History   Tobacco Use  Smoking Status Never  Smokeless Tobacco Never    Current Outpatient Medications on File Prior to Visit  Medication Sig Dispense Refill   acetaminophen (TYLENOL) 500 MG tablet Take by mouth.     alendronate (FOSAMAX) 70 MG tablet Take 1 tablet (70 mg total) by mouth every 7 (seven) days. Take with a full glass of water on an empty stomach. 4 tablet 11   cholecalciferol (VITAMIN D) 1000 units tablet Take 1,000 Units by mouth 2 (two) times daily.     diclofenac (VOLTAREN) 50 MG EC tablet Take 1 tablet (50 mg total) by mouth 2 (two) times daily between meals as needed. 30 tablet 1   gabapentin (NEURONTIN) 300 MG capsule Take 1 capsule (300 mg total) by mouth 2 (two) times daily. 60 capsule 3   levothyroxine (SYNTHROID) 75 MCG tablet TAKE 1 TABLET(75 MCG) BY MOUTH  DAILY 90 tablet 1   sertraline (ZOLOFT) 100 MG tablet TAKE 1 TABLET BY MOUTH DAILY 90 tablet 1   No current facility-administered medications on file prior to visit.     ROS see history of present illness  Objective  Physical Exam Vitals:   02/11/22 1009  BP: 130/80  Pulse: 74  Temp: 98.2 F (36.8 C)  SpO2: 97%    BP Readings from Last 3 Encounters:  02/11/22 130/80  01/22/22 129/69  10/10/21 120/70   Wt Readings from Last 3 Encounters:  02/11/22 149 lb (67.6 kg)  01/22/22 149 lb (67.6 kg)  10/10/21 151 lb (68.5 kg)    Physical Exam Constitutional:      Appearance: Normal appearance.  HENT:     Head: Normocephalic and atraumatic.  Cardiovascular:     Rate and Rhythm: Normal rate and regular rhythm.  Pulmonary:     Effort: Pulmonary effort is normal.     Breath sounds: Normal breath sounds.  Skin:    General: Skin is warm and dry.  Neurological:     Mental Status: She is alert.  Psychiatric:        Mood and Affect: Mood normal.      Assessment/Plan: Please see individual problem list.  Problem List Items Addressed This Visit     Anxiety state (Chronic)    Adequately controlled. She will continue Zoloft 100 mg daily and clonazepam 0.5 mg nightly.  Osteoporosis (Chronic)    Patient will continue Fosamax '70mg'$  weekly and continue her vitamin D supplement.       Right-sided low back pain with right-sided sciatica - Primary (Chronic)    Improving after hip injection last week. She will continue to see orthopedics as needed for pain and she will start seeing PM&R tomorrow for further recommendations. Patient will start physical therapy in February.      Relevant Medications   clonazePAM (KLONOPIN) 0.5 MG tablet   Drowsiness    Related to her daily AM dose of gabapentin. We discussed options of taking her full gabapentin dose ('600mg'$ ) at night, reducing the AM dose, or eliminating the AM dose altogether. Patient has opted to try to take the full '600mg'$   daily dose in the evening to see if her daytime drowsiness improves. She will let us know if she experiences excessive drowsiness the next morning. If this occurs we will reduce the night time dose back to 300 mg and eliminate the morning dose.         Return in about 3 months (around 05/13/2022).   Marisa Cyphers, Medical Student [place]

## 2022-02-11 NOTE — Progress Notes (Signed)
Patient seen along with medical student Zada Zhong.  I personally evaluated this patient along with the student, and verified all aspects of the history, physical exam, and medical decision making as documented by the student.  I agree with the student's documentation and have made all necessary edits.  Penelopi Mikrut, MD  

## 2022-02-11 NOTE — Assessment & Plan Note (Signed)
Patient will continue Fosamax '70mg'$  weekly and continue her vitamin D supplement.

## 2022-02-11 NOTE — Assessment & Plan Note (Addendum)
Improving after hip injection last week. She will continue to see orthopedics as needed for pain and she will start seeing PM&R tomorrow for further recommendations. Patient will start physical therapy in February.

## 2022-02-11 NOTE — Assessment & Plan Note (Addendum)
Related to her daily AM dose of gabapentin. We discussed options of taking her full gabapentin dose ('600mg'$ ) at night, reducing the AM dose, or eliminating the AM dose altogether. Patient has opted to try to take the full '600mg'$  daily dose in the evening to see if her daytime drowsiness improves. She will let us know if she experiences excessive drowsiness the next morning. If this occurs we will reduce the night time dose back to 300 mg and eliminate the morning dose.

## 2022-02-11 NOTE — Assessment & Plan Note (Signed)
Adequately controlled. She will continue Zoloft 100 mg daily and clonazepam 0.5 mg nightly.

## 2022-02-12 ENCOUNTER — Other Ambulatory Visit: Payer: Self-pay | Admitting: Family Medicine

## 2022-02-12 DIAGNOSIS — M5416 Radiculopathy, lumbar region: Secondary | ICD-10-CM

## 2022-02-12 DIAGNOSIS — M5136 Other intervertebral disc degeneration, lumbar region: Secondary | ICD-10-CM | POA: Diagnosis not present

## 2022-02-14 ENCOUNTER — Other Ambulatory Visit: Payer: Medicare Other

## 2022-02-16 ENCOUNTER — Ambulatory Visit
Admission: RE | Admit: 2022-02-16 | Discharge: 2022-02-16 | Disposition: A | Discharge status: Home / Self Care | Payer: Medicare Other | Source: Ambulatory Visit | Attending: Student in an Organized Health Care Education/Training Program | Admitting: Student in an Organized Health Care Education/Training Program

## 2022-02-16 DIAGNOSIS — M5416 Radiculopathy, lumbar region: Secondary | ICD-10-CM | POA: Diagnosis not present

## 2022-02-16 DIAGNOSIS — G8929 Other chronic pain: Secondary | ICD-10-CM | POA: Diagnosis not present

## 2022-02-16 DIAGNOSIS — G894 Chronic pain syndrome: Secondary | ICD-10-CM | POA: Diagnosis not present

## 2022-02-16 DIAGNOSIS — M48061 Spinal stenosis, lumbar region without neurogenic claudication: Secondary | ICD-10-CM | POA: Diagnosis not present

## 2022-02-16 DIAGNOSIS — M47816 Spondylosis without myelopathy or radiculopathy, lumbar region: Secondary | ICD-10-CM | POA: Diagnosis not present

## 2022-03-15 DIAGNOSIS — M5136 Other intervertebral disc degeneration, lumbar region: Secondary | ICD-10-CM | POA: Diagnosis not present

## 2022-03-15 DIAGNOSIS — M5416 Radiculopathy, lumbar region: Secondary | ICD-10-CM | POA: Diagnosis not present

## 2022-03-18 ENCOUNTER — Other Ambulatory Visit: Payer: Self-pay | Admitting: Family Medicine

## 2022-03-22 DIAGNOSIS — M25551 Pain in right hip: Secondary | ICD-10-CM | POA: Diagnosis not present

## 2022-03-26 DIAGNOSIS — M5416 Radiculopathy, lumbar region: Secondary | ICD-10-CM | POA: Diagnosis not present

## 2022-03-26 DIAGNOSIS — M48062 Spinal stenosis, lumbar region with neurogenic claudication: Secondary | ICD-10-CM | POA: Diagnosis not present

## 2022-04-03 DIAGNOSIS — M25551 Pain in right hip: Secondary | ICD-10-CM | POA: Diagnosis not present

## 2022-04-12 DIAGNOSIS — M25551 Pain in right hip: Secondary | ICD-10-CM | POA: Diagnosis not present

## 2022-04-17 ENCOUNTER — Telehealth: Payer: Self-pay | Admitting: Family

## 2022-04-17 DIAGNOSIS — M5136 Other intervertebral disc degeneration, lumbar region: Secondary | ICD-10-CM | POA: Diagnosis not present

## 2022-04-17 DIAGNOSIS — M5416 Radiculopathy, lumbar region: Secondary | ICD-10-CM | POA: Diagnosis not present

## 2022-04-17 DIAGNOSIS — M48062 Spinal stenosis, lumbar region with neurogenic claudication: Secondary | ICD-10-CM | POA: Diagnosis not present

## 2022-04-22 DIAGNOSIS — N390 Urinary tract infection, site not specified: Secondary | ICD-10-CM

## 2022-04-22 DIAGNOSIS — A419 Sepsis, unspecified organism: Secondary | ICD-10-CM

## 2022-04-22 HISTORY — DX: Sepsis, unspecified organism: A41.9

## 2022-04-22 HISTORY — DX: Urinary tract infection, site not specified: N39.0

## 2022-04-22 NOTE — Telephone Encounter (Signed)
Pt need a refill on clonazePAM sent to [facility] 

## 2022-04-23 NOTE — Telephone Encounter (Signed)
Refill sent to pharmacy.   

## 2022-04-23 NOTE — Telephone Encounter (Signed)
Patient wants a refill on Clonzepam 0.5 mg  Last OV-02/11/22 Next OV- 05/17/22 Last fill on 03/19/22 by Dutch Quint

## 2022-04-24 DIAGNOSIS — M25551 Pain in right hip: Secondary | ICD-10-CM | POA: Diagnosis not present

## 2022-04-30 DIAGNOSIS — M5416 Radiculopathy, lumbar region: Secondary | ICD-10-CM | POA: Diagnosis not present

## 2022-04-30 DIAGNOSIS — M48062 Spinal stenosis, lumbar region with neurogenic claudication: Secondary | ICD-10-CM | POA: Diagnosis not present

## 2022-05-16 ENCOUNTER — Other Ambulatory Visit: Payer: Self-pay

## 2022-05-16 ENCOUNTER — Encounter: Payer: Self-pay | Admitting: Internal Medicine

## 2022-05-16 ENCOUNTER — Emergency Department: Payer: Medicare Other

## 2022-05-16 ENCOUNTER — Inpatient Hospital Stay
Admission: EM | Admit: 2022-05-16 | Discharge: 2022-05-19 | DRG: 871 | Disposition: A | Discharge status: Home / Self Care | Payer: Medicare Other | Attending: Obstetrics and Gynecology | Admitting: Obstetrics and Gynecology

## 2022-05-16 DIAGNOSIS — Z9221 Personal history of antineoplastic chemotherapy: Secondary | ICD-10-CM

## 2022-05-16 DIAGNOSIS — Z1152 Encounter for screening for COVID-19: Secondary | ICD-10-CM

## 2022-05-16 DIAGNOSIS — Z85828 Personal history of other malignant neoplasm of skin: Secondary | ICD-10-CM | POA: Diagnosis not present

## 2022-05-16 DIAGNOSIS — R5381 Other malaise: Secondary | ICD-10-CM | POA: Diagnosis present

## 2022-05-16 DIAGNOSIS — Z79899 Other long term (current) drug therapy: Secondary | ICD-10-CM

## 2022-05-16 DIAGNOSIS — Z881 Allergy status to other antibiotic agents status: Secondary | ICD-10-CM

## 2022-05-16 DIAGNOSIS — Z79890 Hormone replacement therapy (postmenopausal): Secondary | ICD-10-CM | POA: Diagnosis not present

## 2022-05-16 DIAGNOSIS — R112 Nausea with vomiting, unspecified: Secondary | ICD-10-CM | POA: Diagnosis not present

## 2022-05-16 DIAGNOSIS — R197 Diarrhea, unspecified: Secondary | ICD-10-CM | POA: Diagnosis not present

## 2022-05-16 DIAGNOSIS — M810 Age-related osteoporosis without current pathological fracture: Secondary | ICD-10-CM | POA: Diagnosis not present

## 2022-05-16 DIAGNOSIS — R6520 Severe sepsis without septic shock: Secondary | ICD-10-CM | POA: Diagnosis not present

## 2022-05-16 DIAGNOSIS — N390 Urinary tract infection, site not specified: Secondary | ICD-10-CM | POA: Diagnosis not present

## 2022-05-16 DIAGNOSIS — F411 Generalized anxiety disorder: Secondary | ICD-10-CM | POA: Diagnosis present

## 2022-05-16 DIAGNOSIS — E876 Hypokalemia: Secondary | ICD-10-CM | POA: Diagnosis not present

## 2022-05-16 DIAGNOSIS — Z91040 Latex allergy status: Secondary | ICD-10-CM

## 2022-05-16 DIAGNOSIS — G894 Chronic pain syndrome: Secondary | ICD-10-CM | POA: Diagnosis present

## 2022-05-16 DIAGNOSIS — F419 Anxiety disorder, unspecified: Secondary | ICD-10-CM | POA: Diagnosis not present

## 2022-05-16 DIAGNOSIS — Z8619 Personal history of other infectious and parasitic diseases: Secondary | ICD-10-CM

## 2022-05-16 DIAGNOSIS — R7881 Bacteremia: Secondary | ICD-10-CM | POA: Diagnosis not present

## 2022-05-16 DIAGNOSIS — Z853 Personal history of malignant neoplasm of breast: Secondary | ICD-10-CM | POA: Diagnosis not present

## 2022-05-16 DIAGNOSIS — E785 Hyperlipidemia, unspecified: Secondary | ICD-10-CM | POA: Diagnosis not present

## 2022-05-16 DIAGNOSIS — A419 Sepsis, unspecified organism: Secondary | ICD-10-CM | POA: Diagnosis not present

## 2022-05-16 DIAGNOSIS — E039 Hypothyroidism, unspecified: Secondary | ICD-10-CM | POA: Diagnosis present

## 2022-05-16 DIAGNOSIS — Z923 Personal history of irradiation: Secondary | ICD-10-CM | POA: Diagnosis not present

## 2022-05-16 DIAGNOSIS — A4151 Sepsis due to Escherichia coli [E. coli]: Secondary | ICD-10-CM | POA: Diagnosis not present

## 2022-05-16 DIAGNOSIS — I959 Hypotension, unspecified: Secondary | ICD-10-CM | POA: Diagnosis present

## 2022-05-16 DIAGNOSIS — F329 Major depressive disorder, single episode, unspecified: Secondary | ICD-10-CM | POA: Diagnosis present

## 2022-05-16 DIAGNOSIS — G9341 Metabolic encephalopathy: Secondary | ICD-10-CM | POA: Diagnosis not present

## 2022-05-16 DIAGNOSIS — M1990 Unspecified osteoarthritis, unspecified site: Secondary | ICD-10-CM | POA: Diagnosis present

## 2022-05-16 DIAGNOSIS — M47814 Spondylosis without myelopathy or radiculopathy, thoracic region: Secondary | ICD-10-CM | POA: Diagnosis not present

## 2022-05-16 DIAGNOSIS — N12 Tubulo-interstitial nephritis, not specified as acute or chronic: Secondary | ICD-10-CM

## 2022-05-16 DIAGNOSIS — D696 Thrombocytopenia, unspecified: Secondary | ICD-10-CM | POA: Diagnosis not present

## 2022-05-16 DIAGNOSIS — K7689 Other specified diseases of liver: Secondary | ICD-10-CM | POA: Diagnosis not present

## 2022-05-16 DIAGNOSIS — K5900 Constipation, unspecified: Secondary | ICD-10-CM | POA: Diagnosis present

## 2022-05-16 DIAGNOSIS — J9811 Atelectasis: Secondary | ICD-10-CM | POA: Diagnosis not present

## 2022-05-16 DIAGNOSIS — Z7983 Long term (current) use of bisphosphonates: Secondary | ICD-10-CM

## 2022-05-16 DIAGNOSIS — N10 Acute tubulo-interstitial nephritis: Secondary | ICD-10-CM | POA: Diagnosis not present

## 2022-05-16 DIAGNOSIS — R000 Tachycardia, unspecified: Secondary | ICD-10-CM | POA: Diagnosis not present

## 2022-05-16 DIAGNOSIS — Z885 Allergy status to narcotic agent status: Secondary | ICD-10-CM

## 2022-05-16 DIAGNOSIS — Z90710 Acquired absence of both cervix and uterus: Secondary | ICD-10-CM

## 2022-05-16 DIAGNOSIS — Z9079 Acquired absence of other genital organ(s): Secondary | ICD-10-CM

## 2022-05-16 LAB — BLOOD CULTURE ID PANEL (REFLEXED) - BCID2

## 2022-05-16 LAB — C-REACTIVE PROTEIN: CRP: 6.8 mg/dL — ABNORMAL HIGH (ref <1.0)

## 2022-05-16 LAB — CBC WITH DIFFERENTIAL/PLATELET
Abs Immature Granulocytes: 0.03 10*3/uL (ref 0.00–0.07)
Abs Immature Granulocytes: 0.06 10*3/uL (ref 0.00–0.07)
Basophils Absolute: 0 10*3/uL (ref 0.0–0.1)
Basophils Absolute: 0 10*3/uL (ref 0.0–0.1)
Basophils Relative: 0 %
Basophils Relative: 0 %
Eosinophils Absolute: 0 10*3/uL (ref 0.0–0.5)
Eosinophils Absolute: 0 10*3/uL (ref 0.0–0.5)
Eosinophils Relative: 0 %
Eosinophils Relative: 0 %
HCT: 39.7 % (ref 36.0–46.0)
HCT: 36.5 % (ref 36.0–46.0)
Hemoglobin: 12.7 g/dL (ref 12.0–15.0)
Hemoglobin: 11.6 g/dL — ABNORMAL LOW (ref 12.0–15.0)
Immature Granulocytes: 1 %
Immature Granulocytes: 1 %
Lymphocytes Relative: 3 %
Lymphocytes Relative: 4 %
Lymphs Abs: 0.2 10*3/uL — ABNORMAL LOW (ref 0.7–4.0)
Lymphs Abs: 0.3 10*3/uL — ABNORMAL LOW (ref 0.7–4.0)
MCH: 29.5 pg (ref 26.0–34.0)
MCH: 30 pg (ref 26.0–34.0)
MCHC: 32 g/dL (ref 30.0–36.0)
MCHC: 31.8 g/dL (ref 30.0–36.0)
MCV: 92.3 fL (ref 80.0–100.0)
MCV: 94.3 fL (ref 80.0–100.0)
Monocytes Absolute: 0.3 10*3/uL (ref 0.1–1.0)
Monocytes Absolute: 0.5 10*3/uL (ref 0.1–1.0)
Monocytes Relative: 5 %
Monocytes Relative: 7 %
Neutro Abs: 5.5 10*3/uL (ref 1.7–7.7)
Neutro Abs: 6.6 10*3/uL (ref 1.7–7.7)
Neutrophils Relative %: 91 %
Neutrophils Relative %: 88 %
Platelets: 115 10*3/uL — ABNORMAL LOW (ref 150–400)
Platelets: 103 10*3/uL — ABNORMAL LOW (ref 150–400)
RBC: 4.3 MIL/uL (ref 3.87–5.11)
RBC: 3.87 MIL/uL (ref 3.87–5.11)
RDW: 12.3 % (ref 11.5–15.5)
RDW: 12.3 % (ref 11.5–15.5)
WBC: 6 10*3/uL (ref 4.0–10.5)
WBC: 7.5 10*3/uL (ref 4.0–10.5)
nRBC: 0 % (ref 0.0–0.2)
nRBC: 0 % (ref 0.0–0.2)

## 2022-05-16 LAB — CBC
HCT: 36.8 % (ref 36.0–46.0)
Hemoglobin: 11.5 g/dL — ABNORMAL LOW (ref 12.0–15.0)
MCH: 29.6 pg (ref 26.0–34.0)
MCHC: 31.3 g/dL (ref 30.0–36.0)
MCV: 94.6 fL (ref 80.0–100.0)
Platelets: 104 10*3/uL — ABNORMAL LOW (ref 150–400)
RBC: 3.89 MIL/uL (ref 3.87–5.11)
RDW: 12.2 % (ref 11.5–15.5)
WBC: 7.4 10*3/uL (ref 4.0–10.5)
nRBC: 0 % (ref 0.0–0.2)

## 2022-05-16 LAB — COMPREHENSIVE METABOLIC PANEL
ALT: 17 U/L (ref 0–44)
AST: 20 U/L (ref 15–41)
Albumin: 3.9 g/dL (ref 3.5–5.0)
Alkaline Phosphatase: 68 U/L (ref 38–126)
Anion gap: 9 (ref 5–15)
BUN: 18 mg/dL (ref 8–23)
CO2: 22 mmol/L (ref 22–32)
Calcium: 8.7 mg/dL — ABNORMAL LOW (ref 8.9–10.3)
Chloride: 106 mmol/L (ref 98–111)
Creatinine, Ser: 0.92 mg/dL (ref 0.44–1.00)
GFR, Estimated: >60 mL/min (ref >60)
Glucose, Bld: 181 mg/dL — ABNORMAL HIGH (ref 70–99)
Potassium: 3.7 mmol/L (ref 3.5–5.1)
Sodium: 137 mmol/L (ref 135–145)
Total Bilirubin: 0.6 mg/dL (ref 0.3–1.2)
Total Protein: 6.9 g/dL (ref 6.5–8.1)

## 2022-05-16 LAB — URINALYSIS, W/ REFLEX TO CULTURE (INFECTION SUSPECTED)
Bilirubin Urine: NEGATIVE
Glucose, UA: NEGATIVE mg/dL
Ketones, ur: 5 mg/dL — AB
Nitrite: POSITIVE — AB
Protein, ur: 100 mg/dL — AB
Specific Gravity, Urine: 1.015 (ref 1.005–1.030)
Squamous Epithelial / HPF: NONE SEEN /HPF (ref 0–5)
WBC, UA: >50 WBC/hpf (ref 0–5)
pH: 6 (ref 5.0–8.0)

## 2022-05-16 LAB — LACTIC ACID, PLASMA
Lactic Acid, Venous: 1.3 mmol/L (ref 0.5–1.9)
Lactic Acid, Venous: 1.6 mmol/L (ref 0.5–1.9)
Lactic Acid, Venous: 1 mmol/L (ref 0.5–1.9)

## 2022-05-16 LAB — T4, FREE
Free T4: 0.63 ng/dL (ref 0.61–1.12)
Free T4: 0.65 ng/dL (ref 0.61–1.12)

## 2022-05-16 LAB — APTT: aPTT: 29 seconds (ref 24–36)

## 2022-05-16 LAB — TECHNOLOGIST SMEAR REVIEW: Plt Morphology: NORMAL

## 2022-05-16 LAB — TSH: TSH: 5.287 u[IU]/mL — ABNORMAL HIGH (ref 0.350–4.500)

## 2022-05-16 LAB — CREATININE, SERUM
Creatinine, Ser: 0.83 mg/dL (ref 0.44–1.00)
GFR, Estimated: >60 mL/min (ref >60)

## 2022-05-16 LAB — LIPASE, BLOOD: Lipase: 23 U/L (ref 11–51)

## 2022-05-16 LAB — PROTIME-INR
INR: 1.2 (ref 0.8–1.2)
Prothrombin Time: 15.1 seconds (ref 11.4–15.2)

## 2022-05-16 LAB — CULTURE, BLOOD (ROUTINE X 2)

## 2022-05-16 MED ORDER — PRAVASTATIN SODIUM 40 MG PO TABS
40.0000 mg | ORAL_TABLET | Freq: Every day | ORAL | Status: DC
  Administered 2022-05-16 – 2022-05-17 (×2): 40 mg via ORAL
  Filled 2022-05-16 (×2): qty 1

## 2022-05-16 MED ORDER — VANCOMYCIN HCL IN DEXTROSE 1-5 GM/200ML-% IV SOLN: 1000.0000 mg | INTRAVENOUS | Status: DC

## 2022-05-16 MED ORDER — SODIUM CHLORIDE 0.9 % IV BOLUS (SEPSIS)
1500.0000 mL | Freq: Once | INTRAVENOUS | Status: AC
  Administered 2022-05-16: 1500 mL via INTRAVENOUS

## 2022-05-16 MED ORDER — SODIUM CHLORIDE 0.9 % IV SOLN
2.0000 g | Freq: Once | INTRAVENOUS | Status: AC
  Administered 2022-05-16: 2 g via INTRAVENOUS
  Filled 2022-05-16: qty 12.5

## 2022-05-16 MED ORDER — ONDANSETRON HCL 4 MG PO TABS: 4.0000 mg | ORAL_TABLET | Freq: Four times a day (QID) | ORAL | Status: DC | PRN

## 2022-05-16 MED ORDER — ALBUMIN HUMAN 25 % IV SOLN
12.5000 g | Freq: Once | INTRAVENOUS | Status: AC
  Administered 2022-05-16: 12.5 g via INTRAVENOUS
  Filled 2022-05-16: qty 50

## 2022-05-16 MED ORDER — LACTATED RINGERS IV SOLN: INTRAVENOUS | Status: AC

## 2022-05-16 MED ORDER — ACETAMINOPHEN 325 MG PO TABS: 650.0000 mg | ORAL_TABLET | Freq: Four times a day (QID) | ORAL | Status: DC | PRN

## 2022-05-16 MED ORDER — LEVOTHYROXINE SODIUM 50 MCG PO TABS
75.0000 ug | ORAL_TABLET | Freq: Every day | ORAL | Status: DC
  Administered 2022-05-16 – 2022-05-19 (×4): 75 ug via ORAL
  Filled 2022-05-16: qty 2
  Filled 2022-05-16 (×3): qty 1

## 2022-05-16 MED ORDER — IOHEXOL 300 MG/ML  SOLN
100.0000 mL | Freq: Once | INTRAMUSCULAR | Status: AC | PRN
  Administered 2022-05-16: 100 mL via INTRAVENOUS

## 2022-05-16 MED ORDER — SODIUM CHLORIDE 0.9 % IV SOLN
1.0000 g | Freq: Three times a day (TID) | INTRAVENOUS | Status: DC
  Administered 2022-05-16: 1 g via INTRAVENOUS
  Filled 2022-05-16: qty 20

## 2022-05-16 MED ORDER — GABAPENTIN 100 MG PO CAPS
100.0000 mg | ORAL_CAPSULE | Freq: Two times a day (BID) | ORAL | Status: DC
  Administered 2022-05-16 – 2022-05-19 (×6): 100 mg via ORAL
  Filled 2022-05-16 (×7): qty 1

## 2022-05-16 MED ORDER — METRONIDAZOLE 500 MG/100ML IV SOLN
500.0000 mg | Freq: Once | INTRAVENOUS | Status: AC
  Administered 2022-05-16: 500 mg via INTRAVENOUS
  Filled 2022-05-16: qty 100

## 2022-05-16 MED ORDER — GABAPENTIN 300 MG PO CAPS: 300.0000 mg | ORAL_CAPSULE | Freq: Two times a day (BID) | ORAL | Status: DC

## 2022-05-16 MED ORDER — CLONAZEPAM 0.5 MG PO TABS
0.5000 mg | ORAL_TABLET | Freq: Every day | ORAL | Status: DC | PRN
  Administered 2022-05-18: 0.5 mg via ORAL
  Filled 2022-05-16: qty 1

## 2022-05-16 MED ORDER — ACETAMINOPHEN 325 MG PO TABS
650.0000 mg | ORAL_TABLET | Freq: Four times a day (QID) | ORAL | Status: DC | PRN
  Administered 2022-05-16 – 2022-05-18 (×4): 650 mg via ORAL
  Filled 2022-05-16 (×4): qty 2

## 2022-05-16 MED ORDER — ONDANSETRON HCL 4 MG/2ML IJ SOLN
4.0000 mg | Freq: Four times a day (QID) | INTRAMUSCULAR | Status: DC | PRN
  Administered 2022-05-18 (×3): 4 mg via INTRAVENOUS
  Filled 2022-05-16 (×3): qty 2

## 2022-05-16 MED ORDER — SERTRALINE HCL 50 MG PO TABS
100.0000 mg | ORAL_TABLET | Freq: Every day | ORAL | Status: DC
  Administered 2022-05-16 – 2022-05-19 (×3): 100 mg via ORAL
  Filled 2022-05-16 (×4): qty 2

## 2022-05-16 MED ORDER — SODIUM CHLORIDE 0.9 % IV SOLN
2.0000 g | INTRAVENOUS | Status: DC
  Administered 2022-05-16 – 2022-05-18 (×3): 2 g via INTRAVENOUS
  Filled 2022-05-16 (×4): qty 20

## 2022-05-16 MED ORDER — ACETAMINOPHEN 650 MG RE SUPP: 650.0000 mg | Freq: Four times a day (QID) | RECTAL | Status: DC | PRN

## 2022-05-16 MED ORDER — SODIUM CHLORIDE 0.9 % IV BOLUS
1000.0000 mL | Freq: Once | INTRAVENOUS | Status: AC
  Administered 2022-05-16: 1000 mL via INTRAVENOUS

## 2022-05-16 MED ORDER — ALBUTEROL SULFATE (2.5 MG/3ML) 0.083% IN NEBU: 2.5000 mg | INHALATION_SOLUTION | RESPIRATORY_TRACT | Status: DC | PRN

## 2022-05-16 MED ORDER — HEPARIN SODIUM (PORCINE) 5000 UNIT/ML IJ SOLN
5000.0000 [IU] | Freq: Three times a day (TID) | INTRAMUSCULAR | Status: DC
  Administered 2022-05-16 – 2022-05-17 (×4): 5000 [IU] via SUBCUTANEOUS
  Filled 2022-05-16 (×4): qty 1

## 2022-05-16 MED ORDER — SODIUM CHLORIDE 0.9 % IV SOLN: 2.0000 g | Freq: Two times a day (BID) | INTRAVENOUS | Status: DC

## 2022-05-16 MED ORDER — LACTATED RINGERS IV BOLUS
250.0000 mL | Freq: Once | INTRAVENOUS | Status: AC
  Administered 2022-05-16: 250 mL via INTRAVENOUS

## 2022-05-16 MED ORDER — VANCOMYCIN HCL 1750 MG/350ML IV SOLN
1750.0000 mg | Freq: Once | INTRAVENOUS | Status: AC
  Administered 2022-05-16: 1750 mg via INTRAVENOUS
  Filled 2022-05-16: qty 350

## 2022-05-16 MED ORDER — ACETAMINOPHEN 500 MG PO TABS
1000.0000 mg | ORAL_TABLET | Freq: Once | ORAL | Status: AC
  Administered 2022-05-16: 1000 mg via ORAL
  Filled 2022-05-16: qty 2

## 2022-05-16 NOTE — ED Notes (Signed)
To pts room, pt sleeping. Pt on RA o2 sats 89%, see flowsheet. [facility]

## 2022-05-16 NOTE — Progress Notes (Signed)
Interim progress note not for billing  See h and p for detailes, I agree with assessment and plan unless otherwise stated.  Here with urosepsis, right sided pyelo on ct, no complications seen on st. Hemodynamically stable but soft bps, most recent maps in the 80s. Also mildy encephalopathic. I will bolus her 1 liter and increase her fluids to 125. I have reviewed her culture history and grew esbl e coli in 2017, the most recent culture data I can see. I will thus stop her vanc/cefepime and start her on meropenem.

## 2022-05-16 NOTE — ED Notes (Addendum)
Pt called out for bedpan. Pt placed in bed pan and asked for privacy. Call bell within reach and pt instructed to push call bell when done. No Bm at this time only urine. Peri care provided. Pt linens changes. Chux pad and new brief placed. Pt resting comfortably at this time. Family at bedside. Assisted by NT Mel.

## 2022-05-16 NOTE — ED Provider Notes (Addendum)
[facility] Provider Note    Event Date/Time   First MD Initiated Contact with Patient 05/16/22 [phone]     (approximate)   History   Diarrhea, Emesis, and Fever   HPI  Jenny Roberts is a 77 y.o. female   Past medical history of hypothyroid, osteoporosis, remote breast cancer no longer on treatment, hyperlipidemia presents emergency department with fever, abdominal pain, nausea vomiting diarrhea over the last 3 days.  Worsening mental status during this time as well.  Mild cough during this time.  No dysuria or frequency.  Patient offers limited history due to altered mental status.  Husband is at bedside to give most of the collateral information obtained above  Concern for sepsis given high fever, tachycardia, tachypnea.   Independent Historian contributed to assessment above: Husband who is at bedside who gives most of the information obtained above  External Medical Documents Reviewed: Family medicine outpatient clinic visit dated 02/11/2022 where they reviewed her past medical history and addressed anxiety, osteoporosis, back pain      Physical Exam   Triage Vital Signs: ED Triage Vitals  Enc Vitals Group     BP 05/16/22 0228 (!) 143/63     Pulse Rate 05/16/22 0228 (!) 108     Resp 05/16/22 0228 (!) 23     Temp 05/16/22 0228 (!) 103.4 F (39.7 C)     Temp Source 05/16/22 0228 Oral     SpO2 05/16/22 0222 94 %     Weight 05/16/22 0229 159 lb 4.8 oz (72.3 kg)     Height --      Head Circumference --      Peak Flow --      Pain Score --      Pain Loc --      Pain Edu? --      Excl. in GC? --     Most recent vital signs: Vitals:   05/16/22 0350 05/16/22 0352  BP:    Pulse:  100  Resp:  17  Temp: (!) 100.5 F (38.1 C)   SpO2:  95%    General: Awake, rigors, disoriented, but attempting to answer questions.  Neck supple with full range of motion, Kernig and Brudzinski signs negative. CV:  Good peripheral perfusion.  Resp:  Normal  effort.  Abd:  No distention.  Has tenderness to the right lower quadrant. Other:  Lungs are clear to auscultation without focalities or wheezing.  She is febrile 104 and tachycardic 108 with a respiratory rate of 23 and oxygen saturation in the mid 90s on room air   ED Results / Procedures / Treatments   Labs (all labs ordered are listed, but only abnormal results are displayed) Labs Reviewed  COMPREHENSIVE METABOLIC PANEL - Abnormal; Notable for the following components:      Result Value   Glucose, Bld 181 (*)    Calcium 8.7 (*)    All other components within normal limits  CBC WITH DIFFERENTIAL/PLATELET - Abnormal; Notable for the following components:   Platelets 115 (*)    Lymphs Abs 0.2 (*)    All other components within normal limits  TSH - Abnormal; Notable for the following components:   TSH 5.287 (*)    All other components within normal limits  URINALYSIS, W/ REFLEX TO CULTURE (INFECTION SUSPECTED) - Abnormal; Notable for the following components:   Color, Urine YELLOW (*)    APPearance CLOUDY (*)    Hgb urine dipstick MODERATE (*)  Ketones, ur 5 (*)    Protein, ur 100 (*)    Nitrite POSITIVE (*)    Leukocytes,Ua LARGE (*)    Bacteria, UA MANY (*)    All other components within normal limits  CULTURE, BLOOD (ROUTINE X 2)  CULTURE, BLOOD (ROUTINE X 2)  URINE CULTURE  LACTIC ACID, PLASMA  LACTIC ACID, PLASMA  PROTIME-INR  APTT  LIPASE, BLOOD  T4, FREE     I ordered and reviewed the above labs they are notable for nl WBC and lactic; Nitrite and bacteria in urine   EKG  ED ECG REPORT I, Pilar Jarvis, the attending physician, personally viewed and interpreted this ECG.   Date: 05/16/2022  EKG Time: 0228  Rate: 106  Rhythm: sinus tachycardia  Axis: nl  Intervals:none  ST&T Change: No acute ischemic changes    RADIOLOGY I independently reviewed and interpreted cxr and see no obvious infiltrate   PROCEDURES:  Critical Care performed: Yes, see  critical care procedure note(s)  .Critical Care  Performed by: Pilar Jarvis, MD Authorized by: Pilar Jarvis, MD   Critical care provider statement:    Critical care time (minutes):  30   Critical care was time spent personally by me on the following activities:  Development of treatment plan with patient or surrogate, discussions with consultants, evaluation of patient's response to treatment, examination of patient, ordering and review of laboratory studies, ordering and review of radiographic studies, ordering and performing treatments and interventions, pulse oximetry, re-evaluation of patient's condition and review of old charts    MEDICATIONS ORDERED IN ED: Medications  vancomycin (VANCOREADY) IVPB 1750 mg/350 mL (1,750 mg Intravenous New Bag/Given 05/16/22 0350)  acetaminophen (TYLENOL) tablet 1,000 mg (1,000 mg Oral Given 05/16/22 0246)  sodium chloride 0.9 % bolus 1,500 mL (0 mLs Intravenous Stopped 05/16/22 0319)  ceFEPIme (MAXIPIME) 2 g in sodium chloride 0.9 % 100 mL IVPB (0 g Intravenous Stopped 05/16/22 0319)  metroNIDAZOLE (FLAGYL) IVPB 500 mg (0 mg Intravenous Stopped 05/16/22 0354)  iohexol (OMNIPAQUE) 300 MG/ML solution 100 mL (100 mLs Intravenous Contrast Given 05/16/22 0403)    External physician / consultants:  I spoke with hospitalist for admission and regarding care plan for this patient.   IMPRESSION / MDM / ASSESSMENT AND PLAN / ED COURSE  I reviewed the triage vital signs and the nursing notes.                                Patient's presentation is most consistent with acute presentation with potential threat to life or bodily function.  Differential diagnosis includes, but is not limited to, sepsis due to urinary tract infection, intra-abdominal infection like appendicitis, bacterial pneumonia, meningitis   The patient is on the cardiac monitor to evaluate for evidence of arrhythmia and/or significant heart rate changes.  MDM: Patient with concern for sepsis  given fever tachycardia tachypnea and source is broad, given her nausea vomiting diarrhea over the several days and abdominal tenderness concern for intra-abdominal infection obtain a CT scan to rule out surgical pathologies.  Given her mild cough we will run the CT scan to the chest as well.  Broad-spectrum antibiotics vancomycin, cefepime and Flagyl as well as 30 cc/kg sepsis bolus of crystalloids ordered upon initial evaluation.  Tylenol for fever.  Considered meningitis given altered mental status and high fever but less likely given GI symptoms and cough, no meningismus negative Kernig and Brezinski. -- UTI on UA.  Pyelonephritis identified on CT scan.  Admit to medicine.   SEPSIS RE-EVAL:  Fever much improved, HR improved.           FINAL CLINICAL IMPRESSION(S) / ED DIAGNOSES   Final diagnoses:  Sepsis, due to unspecified organism, unspecified whether acute organ dysfunction present  Pyelonephritis     Rx / DC Orders   ED Discharge Orders     None        Note:  This document was prepared using Dragon voice recognition software and may include unintentional dictation errors.    Pilar Jarvis, MD 05/16/22 1610    Pilar Jarvis, MD 05/16/22 [phone]

## 2022-05-16 NOTE — Progress Notes (Signed)
PHARMACY - PHYSICIAN COMMUNICATION CRITICAL VALUE ALERT - BLOOD CULTURE IDENTIFICATION (BCID)  Jenny Roberts is an 77 y.o. female who presented to [facility] on 05/16/2022 with a chief complaint of N/V/D and fever  Assessment:  4/25 blood cultures with GNR 4/4 bottles, BCID E coli.  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)  Changes to prescribed antibiotics recommended:  Recommendations accepted by provider - no ESBL gene detected so change to ceftriaxone  Results for orders placed or performed during the hospital encounter of 05/16/22  Blood Culture ID Panel (Reflexed) (Collected: 05/16/2022  2:40 AM)  Result Value Ref Range   Enterococcus faecalis NOT DETECTED NOT DETECTED   Enterococcus Faecium NOT DETECTED NOT DETECTED   Listeria monocytogenes NOT DETECTED NOT DETECTED   Staphylococcus species NOT DETECTED NOT DETECTED   Staphylococcus aureus (BCID) NOT DETECTED NOT DETECTED   Staphylococcus epidermidis NOT DETECTED NOT DETECTED   Staphylococcus lugdunensis NOT DETECTED NOT DETECTED   Streptococcus species NOT DETECTED NOT DETECTED   Streptococcus agalactiae NOT DETECTED NOT DETECTED   Streptococcus pneumoniae NOT DETECTED NOT DETECTED   Streptococcus pyogenes NOT DETECTED NOT DETECTED   A.calcoaceticus-baumannii NOT DETECTED NOT DETECTED   Bacteroides fragilis NOT DETECTED NOT DETECTED   Enterobacterales DETECTED (A) NOT DETECTED   Enterobacter cloacae complex NOT DETECTED NOT DETECTED   Escherichia coli DETECTED (A) NOT DETECTED   Klebsiella aerogenes NOT DETECTED NOT DETECTED   Klebsiella oxytoca NOT DETECTED NOT DETECTED   Klebsiella pneumoniae NOT DETECTED NOT DETECTED   Proteus species NOT DETECTED NOT DETECTED   Salmonella species NOT DETECTED NOT DETECTED   Serratia marcescens NOT DETECTED NOT DETECTED   Haemophilus influenzae NOT DETECTED NOT DETECTED    Neisseria meningitidis NOT DETECTED NOT DETECTED   Pseudomonas aeruginosa NOT DETECTED NOT DETECTED   Stenotrophomonas maltophilia NOT DETECTED NOT DETECTED   Candida albicans NOT DETECTED NOT DETECTED   Candida auris NOT DETECTED NOT DETECTED   Candida glabrata NOT DETECTED NOT DETECTED   Candida krusei NOT DETECTED NOT DETECTED   Candida parapsilosis NOT DETECTED NOT DETECTED   Candida tropicalis NOT DETECTED NOT DETECTED   Cryptococcus neoformans/gattii NOT DETECTED NOT DETECTED   CTX-M ESBL NOT DETECTED NOT DETECTED   Carbapenem resistance IMP NOT DETECTED NOT DETECTED   Carbapenem resistance KPC NOT DETECTED NOT DETECTED   Carbapenem resistance NDM NOT DETECTED NOT DETECTED   Carbapenem resist OXA 48 LIKE NOT DETECTED NOT DETECTED   Carbapenem resistance VIM NOT DETECTED NOT DETECTED   Juliette Alcide, PharmD, BCPS, BCIDP Work Cell: [phone] 05/16/2022 2:57 PM

## 2022-05-16 NOTE — Progress Notes (Signed)
PHARMACY -  BRIEF ANTIBIOTIC NOTE   Pharmacy has received consult(s) for Vancomycin from an ED provider.  The patient's profile has been reviewed for ht/wt/allergies/indication/available labs.    One time order(s) placed for Vancomycin 1750 mg IV X 1.   Further antibiotics/pharmacy consults should be ordered by admitting physician if indicated.                       Thank you, Sajan Cheatwood D 05/16/2022  2:45 AM

## 2022-05-16 NOTE — Progress Notes (Signed)
Pharmacy Antibiotic Note  Jenny Roberts is a 77 y.o. female admitted on 05/16/2022 with sepsis and UTI.  Pharmacy has been consulted for Vancomycin, Cefepime dosing.  Plan: Cefepime 2 gm IV X 1 given in ED on 4/25 @ 0249. Cefepime 2 gm IV Q12H ordered to start on 4/25 @ 1500.  Vancomycin 1750 mg IV X 1 given in ED 4/25 @ 0350. Vancomycin 1 gm IV Q24H ordered to start on 4/26 @ 0400.  AUC = 505.3 Vanc trough = 13.4   Weight: 72.3 kg (159 lb 4.8 oz)  Temp (24hrs), Avg:102 F (38.9 C), Min:99.5 F (37.5 C), Max:104.5 F (40.3 C)  Recent Labs  Lab 05/16/22 0240 05/16/22 0404  WBC 6.0  --   CREATININE 0.92  --   LATICACIDVEN 1.3 1.6    Estimated Creatinine Clearance: 47.7 mL/min (by C-G formula based on SCr of 0.92 mg/dL).    Allergies  Allergen Reactions   Amoxicillin-Pot Clavulanate Diarrhea and Other (See Comments)    Has patient had a PCN reaction causing immediate rash, facial/tongue/throat swelling, SOB or lightheadedness with hypotension: No Has patient had a PCN reaction causing severe rash involving mucus membranes or skin necrosis: No Has patient had a PCN reaction that required hospitalization No Has patient had a PCN reaction occurring within the last 10 years: Yes If all of the above answers are "NO", then may proceed with Cephalosporin use.    Codeine Hives and Other (See Comments)    shakes   Latex Rash and Itching    Antimicrobials this admission:   >>    >>   Dose adjustments this admission:   Microbiology results:  BCx:   UCx:    Sputum:    MRSA PCR:   Thank you for allowing pharmacy to be a part of this patient's care.  Tonyia Marschall D 05/16/2022 6:17 AM

## 2022-05-16 NOTE — ED Notes (Signed)
Place patient on bed pan. Removed bed pan,changed bedding, and cleaned patient up with help from [facility]. Placed pure wick back on patient and gave warm blankets.

## 2022-05-16 NOTE — H&P (Addendum)
History and Physical    Jenny Roberts VWU:[id number] DOB: 06/12/1945 DOA: 05/16/2022  PCP: Glori Luis, MD  Patient coming from:  home  I have personally briefly reviewed patient's old medical records in [facility]  Chief Complaint:  n/v/d Maia Plan x 3 days   HPI: Jenny Roberts is a 77 y.o. female with medical history significant of hypothyroidism  Anxiety ,depression, hyperlipidemia , BRCA  s/p lumpectomy and chemo tx  no in remission since 2021 ,who presents to ED with 3 days of n/v/d/ f/cas well as associated altered mental status. Patient notes she also noted foul odor to urine but no dysuria. She denies any  associated sob/chest pain / flank or abdominal pain.  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 She currently, notes she is feeling improved since treatment in ED.   ED Course: Vitals:  104.5,  bp 143/63, hr 108,  rr 23 sat 93%  Labs:  Wbc 6, hgb 12.7,  plt 115,  pmn 91 %  Inr 1.3  N a 137, 3.7, cL 106, Glu 181 cr 0.92,  Lipase 23  Tsh 5.2 UA + Large LE , +bacteria , wbc >50   CTAB IMPRESSION: Right pyelonephritis without hydronephrosis, stone, or abscess.   Cxr: IMPRESSION: Low lung volumes with chronic appearing increased interstitial lung markings.   Tx tylenol 1gram, cefepime  2gram , ns 1.5L , metronidazxole 500 mg , cefepime 2 gram , vanc  Review of Systems: As per HPI otherwise 10 point review of systems negative.   Past Medical History:  Diagnosis Date   Anxiety    Breast cancer (HCC) 2001   Right. Treated with lumpectomy followed by Chemotherapy and Radiation therapy   Depression    Hyperlipidemia    Nontraumatic rupture of tendon of thumb 06/15/2014   Personal history of chemotherapy    Personal history of radiation therapy    Right forearm fracture 05/22/2020   Skin cancer 09/2004   treated at [facility]   Thyroid disease     Past Surgical History:  Procedure Laterality Date   ABDOMINAL HYSTERECTOMY  1983   Left parts of  each ovary    BREAST BIOPSY Right 2001   positive   BREAST EXCISIONAL BIOPSY Right 1990's   benign   BREAST LUMPECTOMY Right 08/16/1999   Metaplastic carcinoma, grade 3; 1.7 cm, T1c, N0; 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  Focal positive posterior margin.  0/5 lymph nodes.   CARPAL TUNNEL RELEASE  2016   COLONOSCOPY  2008   COLONOSCOPY WITH PROPOFOL N/A 02/16/2018   Procedure: COLONOSCOPY WITH PROPOFOL;  Surgeon: Wyline Mood, MD;  Location: [facility] ENDOSCOPY;  Service: Endoscopy;  Laterality: N/A;   KNEE SURGERY  2003   mole excision  2006   Basal cell    WRIST SURGERY Right 2010     reports that she has never smoked. She has never used smokeless tobacco. She reports that she does not drink alcohol and does not use drugs.  Allergies  Allergen Reactions   Amoxicillin-Pot Clavulanate Diarrhea and Other (See Comments)    Has patient had a PCN reaction causing immediate rash, facial/tongue/throat swelling, SOB or lightheadedness with hypotension: No Has patient had a PCN reaction causing severe rash involving mucus membranes or skin necrosis: No Has patient had a PCN reaction that required hospitalization No Has patient had a PCN reaction occurring within the last 10 years: Yes If all of the above answers are "NO", then may  proceed with Cephalosporin use.    Codeine Hives and Other (See Comments)    shakes   Latex Rash and Itching    Family History  Problem Relation Age of Onset   Brain cancer Father    Cancer Maternal Grandfather 20       Colon Cancer   Breast cancer Neg Hx     Prior to Admission medications   Medication Sig Start Date End Date Taking? Authorizing Provider  acetaminophen (TYLENOL) 500 MG tablet Take by mouth.    [provider]  alendronate (FOSAMAX) 70 MG tablet Take 1 tablet (70 mg total) by mouth every 7 (seven) days. Take with a full glass of water on an empty stomach. 10/10/21   Glori Luis, MD  cholecalciferol (VITAMIN D) 1000 units  tablet Take 1,000 Units by mouth 2 (two) times daily.    [provider]  clonazePAM (KLONOPIN) 0.5 MG tablet TAKE 1 TABLET(0.5 MG) BY MOUTH DAILY 04/23/22   Glori Luis, MD  diclofenac (VOLTAREN) 50 MG EC tablet Take 1 tablet (50 mg total) by mouth 2 (two) times daily between meals as needed. 10/04/21   Edward Jolly, MD  gabapentin (NEURONTIN) 300 MG capsule Take 1 capsule (300 mg total) by mouth 2 (two) times daily. 01/22/22 05/22/22  Edward Jolly, MD  levothyroxine (SYNTHROID) 75 MCG tablet TAKE 1 TABLET(75 MCG) BY MOUTH DAILY 12/05/21   Glori Luis, MD  lovastatin (MEVACOR) 40 MG tablet TAKE 1 TABLET(40 MG) BY MOUTH DAILY 02/11/22   Glori Luis, MD  sertraline (ZOLOFT) 100 MG tablet TAKE 1 TABLET BY MOUTH DAILY 01/08/22   Glori Luis, MD    Physical Exam: Vitals:   05/16/22 0350 05/16/22 0352 05/16/22 0445 05/16/22 0458  BP:   (!) 109/56   Pulse:  100 94   Resp:  17 14   Temp: (!) 100.5 F (38.1 C)   99.5 F (37.5 C)  TempSrc: Oral   Oral  SpO2:  95% 95%   Weight:        Constitutional: NAD, calm, comfortable Vitals:   05/16/22 0350 05/16/22 0352 05/16/22 0445 05/16/22 0458  BP:   (!) 109/56   Pulse:  100 94   Resp:  17 14   Temp: (!) 100.5 F (38.1 C)   99.5 F (37.5 C)  TempSrc: Oral   Oral  SpO2:  95% 95%   Weight:       Eyes: PERRL, lids and conjunctivae normal ENMT: Mucous membranes are dry. Posterior pharynx clear of any exudate or lesions.Normal dentition.  Neck: normal, supple, no masses, no thyromegaly Respiratory: clear to auscultation bilaterally, no wheezing, no crackles. Normal respiratory effort. No accessory muscle use.  Cardiovascular: Regular rate and rhythm, no murmurs / rubs / gallops. No extremity edema. 2+ pedal pulses.  Abdomen: no tenderness, no masses palpated. No hepatosplenomegaly. Bowel sounds positive. Distended bladder Musculoskeletal: no clubbing / cyanosis. No joint deformity upper and lower extremities. Good  ROM, no contractures. Normal muscle tone.  Skin: no rashes, lesions, ulcers. No induration Neurologic: CN 2-12 grossly intact. Sensation intact, MAE Psychiatric: Normal judgment and insight. Alert and oriented x 3. Normal mood.    Labs on Admission: I have personally reviewed following labs and imaging studies  CBC: Recent Labs  Lab 05/16/22 0240  WBC 6.0  NEUTROABS 5.5  HGB 12.7  HCT 39.7  MCV 92.3  PLT 115*   Basic Metabolic Panel: Recent Labs  Lab 05/16/22 0240  NA 137  K 3.7  CL 106  CO2 22  GLUCOSE 181*  BUN 18  CREATININE 0.92  CALCIUM 8.7*   GFR: Estimated Creatinine Clearance: 47.7 mL/min (by C-G formula based on SCr of 0.92 mg/dL). Liver Function Tests: Recent Labs  Lab 05/16/22 0240  AST 20  ALT 17  ALKPHOS 68  BILITOT 0.6  PROT 6.9  ALBUMIN 3.9   Recent Labs  Lab 05/16/22 0240  LIPASE 23   No results for input(s): "AMMONIA" in the last 168 hours. Coagulation Profile: Recent Labs  Lab 05/16/22 0240  INR 1.2   Cardiac Enzymes: No results for input(s): "CKTOTAL", "CKMB", "CKMBINDEX", "TROPONINI" in the last 168 hours. BNP (last 3 results) No results for input(s): "PROBNP" in the last 8760 hours. HbA1C: No results for input(s): "HGBA1C" in the last 72 hours. CBG: No results for input(s): "GLUCAP" in the last 168 hours. Lipid Profile: No results for input(s): "CHOL", "HDL", "LDLCALC", "TRIG", "CHOLHDL", "LDLDIRECT" in the last 72 hours. Thyroid Function Tests: Recent Labs    05/16/22 0240  TSH 5.287*  FREET4 0.63   Anemia Panel: No results for input(s): "VITAMINB12", "FOLATE", "FERRITIN", "TIBC", "IRON", "RETICCTPCT" in the last 72 hours. Urine analysis:    Component Value Date/Time   COLORURINE YELLOW (A) 05/16/2022 0240   APPEARANCEUR CLOUDY (A) 05/16/2022 0240   APPEARANCEUR Hazy 09/12/2012 1611   LABSPEC 1.015 05/16/2022 0240   LABSPEC 1.020 09/12/2012 1611   PHURINE 6.0 05/16/2022 0240   GLUCOSEU NEGATIVE 05/16/2022 0240    GLUCOSEU Negative 09/12/2012 1611   HGBUR MODERATE (A) 05/16/2022 0240   BILIRUBINUR NEGATIVE 05/16/2022 0240   BILIRUBINUR neg 10/27/2018 0906   BILIRUBINUR Negative 09/12/2012 1611   KETONESUR 5 (A) 05/16/2022 0240   PROTEINUR 100 (A) 05/16/2022 0240   UROBILINOGEN 0.2 10/27/2018 0906   NITRITE POSITIVE (A) 05/16/2022 0240   LEUKOCYTESUR LARGE (A) 05/16/2022 0240   LEUKOCYTESUR 3+ 09/12/2012 1611    Radiological Exams on Admission: CT CHEST ABDOMEN PELVIS W CONTRAST  Result Date: 05/16/2022 CLINICAL DATA:  Sepsis. Fever and abdominal pain with nausea and vomiting for the past 3 days EXAM: CT CHEST, ABDOMEN, AND PELVIS WITH CONTRAST TECHNIQUE: Multidetector CT imaging of the chest, abdomen and pelvis was performed following the standard protocol during bolus administration of intravenous contrast. RADIATION DOSE REDUCTION: This exam was performed according to the departmental dose-optimization program which includes automated exposure control, adjustment of the mA and/or kV according to patient size and/or use of iterative reconstruction technique. CONTRAST:  OMNIPAQUE IOHEXOL 300 MG/ML  SOLN COMPARISON:  09/12/2012 abdominal CT FINDINGS: CT CHEST FINDINGS Cardiovascular: Normal heart size. No pericardial effusion. 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 Atheromatous calcification of the aorta. Mediastinum/Nodes: Negative for mass or adenopathy Lungs/Pleura: Mild dependent atelectasis. There is no edema, consolidation, effusion, or pneumothorax. Musculoskeletal: Generalized thoracic spine degeneration which is advanced with mild scoliosis. Sclerosis in line along the lateral right ribs from old fractures. CT ABDOMEN PELVIS FINDINGS Hepatobiliary: Multiple liver cysts with benign/simple appearance measuring up to 5.4 cm in the right lobe. No evidence of abscess. Small hypervascular area in segment 3 is a shunt based on the delayed phase.No evidence of biliary  obstruction or stone. Pancreas: Generalized atrophy. Spleen: Mild enlargement with 5 cm thickness at this similar to before. 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 Adrenals/Urinary Tract: Negative adrenals. Patchy hypoenhancement of the right renal cortex inferiorly with asymmetric right urothelial thickening and perinephric stranding. No abscess, hydronephrosis, or calculus. Unremarkable bladder.  Stomach/Bowel: No obstruction. No appendicitis. Numerous colonic diverticula. Vascular/Lymphatic: No acute vascular abnormality. No mass or adenopathy. Reproductive:Hysterectomy Other: No ascites or pneumoperitoneum. Musculoskeletal: Advanced and generalized lumbar spine degeneration with multilevel disc and facet spurring and foraminal impingement. Scoliosis and L5-S1 anterolisthesis. IMPRESSION: Right pyelonephritis without hydronephrosis, stone, or abscess. Electronically Signed   By: Tiburcio Pea M.D.   On: 05/16/2022 04:33   DG Chest Port 1 View  Result Date: 05/16/2022 CLINICAL DATA:  Questionable sepsis. EXAM: PORTABLE CHEST 1 VIEW COMPARISON:  July 08, 2007 FINDINGS: The heart size and mediastinal contours are within normal limits. Low lung volumes are noted with moderate severity elevation of the right hemidiaphragm. Mild to moderate severity diffuse, chronic appearing increased interstitial lung markings are seen. There is no evidence of focal consolidation, pleural effusion or pneumothorax. Radiopaque surgical clips are seen along the lateral aspect of the upper right chest wall. Multilevel degenerative changes are noted throughout the thoracic spine. IMPRESSION: Low lung volumes with chronic appearing increased interstitial lung markings. Electronically Signed   By: Aram Candela M.D.   On: 05/16/2022 03:24    EKG: Independently reviewed.   Assessment/Plan Acute R pyelonephritis with associated sepsis w/o shock  Acute Metabolic Encephalopathy  -ct scan: Right  pyelonephritis without hydronephrosis, stone, or abscess. - fever, tachycardia , tachypnea, + uti  - admit to progressive care  - continue on cefepime and vanc , de-escalate as able  -f/u on culture  -ivfs per sepsis protocol , albumin prn if ivfs not responsive, monitor for need for vasopressor support  -lactic 1.6 continue to cycle  -check inflammatory markers -initially somnolent now back to baseline   Anxiety/depression -resume ssri /klonipin  hyperlipidemia  -continue lovastatin     BRCA  s/p lumpectomy and chemo tx  -  in remission   Hypothyroidism  -continue synthroid -elevated tsh, check t4/t3  DVT prophylaxis: heparin Code Status: full/ as discussed per patient wishes in event of cardiac arrest  Family Communication: husband at bedside , poc discussed with patient and husband  Disposition Plan:patient  expected to be admitted greater than 2 midnights  Consults called: n/a Admission status: progressive care    Lurline Del MD [facility]  If 7PM-7AM, please contact night-coverage [email or web address] Password [facility]  05/16/2022, 5:13 AM

## 2022-05-17 ENCOUNTER — Ambulatory Visit: Payer: Medicare Other | Admitting: Family Medicine

## 2022-05-17 DIAGNOSIS — R7881 Bacteremia: Secondary | ICD-10-CM | POA: Diagnosis present

## 2022-05-17 LAB — COMPREHENSIVE METABOLIC PANEL
ALT: 15 U/L (ref 0–44)
AST: 22 U/L (ref 15–41)
Albumin: 3 g/dL — ABNORMAL LOW (ref 3.5–5.0)
Alkaline Phosphatase: 46 U/L (ref 38–126)
Anion gap: 5 (ref 5–15)
BUN: 11 mg/dL (ref 8–23)
CO2: 24 mmol/L (ref 22–32)
Calcium: 7.8 mg/dL — ABNORMAL LOW (ref 8.9–10.3)
Chloride: 108 mmol/L (ref 98–111)
Creatinine, Ser: 0.62 mg/dL (ref 0.44–1.00)
GFR, Estimated: >60 mL/min (ref >60)
Glucose, Bld: 117 mg/dL — ABNORMAL HIGH (ref 70–99)
Potassium: 3.2 mmol/L — ABNORMAL LOW (ref 3.5–5.1)
Sodium: 137 mmol/L (ref 135–145)
Total Bilirubin: 0.9 mg/dL (ref 0.3–1.2)
Total Protein: 5.2 g/dL — ABNORMAL LOW (ref 6.5–8.1)

## 2022-05-17 LAB — CBC
HCT: 31.3 % — ABNORMAL LOW (ref 36.0–46.0)
Hemoglobin: 10.3 g/dL — ABNORMAL LOW (ref 12.0–15.0)
MCH: 29.9 pg (ref 26.0–34.0)
MCHC: 32.9 g/dL (ref 30.0–36.0)
MCV: 90.7 fL (ref 80.0–100.0)
Platelets: 94 10*3/uL — ABNORMAL LOW (ref 150–400)
RBC: 3.45 MIL/uL — ABNORMAL LOW (ref 3.87–5.11)
RDW: 12.2 % (ref 11.5–15.5)
WBC: 5.6 10*3/uL (ref 4.0–10.5)
nRBC: 0 % (ref 0.0–0.2)

## 2022-05-17 LAB — CULTURE, BLOOD (ROUTINE X 2)

## 2022-05-17 LAB — MAGNESIUM: Magnesium: 2 mg/dL (ref 1.7–2.4)

## 2022-05-17 LAB — HIV ANTIBODY (ROUTINE TESTING W REFLEX): HIV Screen 4th Generation wRfx: NONREACTIVE

## 2022-05-17 LAB — URINE CULTURE

## 2022-05-17 LAB — T3, FREE: T3, Free: 1.9 pg/mL — ABNORMAL LOW (ref 2.0–4.4)

## 2022-05-17 MED ORDER — POLYETHYLENE GLYCOL 3350 17 G PO PACK
34.0000 g | PACK | Freq: Every day | ORAL | Status: DC
  Administered 2022-05-17: 34 g via ORAL
  Filled 2022-05-17: qty 2

## 2022-05-17 MED ORDER — ENOXAPARIN SODIUM 40 MG/0.4ML IJ SOSY
40.0000 mg | PREFILLED_SYRINGE | Freq: Every day | INTRAMUSCULAR | Status: DC
  Administered 2022-05-17 – 2022-05-18 (×2): 40 mg via SUBCUTANEOUS
  Filled 2022-05-17 (×2): qty 0.4

## 2022-05-17 MED ORDER — SENNA 8.6 MG PO TABS
1.0000 | ORAL_TABLET | Freq: Every day | ORAL | Status: DC
  Administered 2022-05-17: 8.6 mg via ORAL
  Filled 2022-05-17: qty 1

## 2022-05-17 MED ORDER — POTASSIUM CHLORIDE IN NACL 20-0.9 MEQ/L-% IV SOLN
INTRAVENOUS | Status: DC
  Filled 2022-05-17 (×8): qty 1000

## 2022-05-17 MED ORDER — SODIUM CHLORIDE 0.9 % IV SOLN: INTRAVENOUS | Status: DC | PRN

## 2022-05-17 NOTE — Evaluation (Signed)
Physical Therapy Evaluation Patient Details Name: Jenny Roberts MRN: [id number] DOB: 1945-04-18 Today's Date: 05/17/2022  History of Present Illness  Pt is a 77 y.o. female presenting to hospital 05/16/22 with c/o fever, abdominal pain, N/V/D past 3 days; also worsening mental status and mild cough.  Pt admitted with acute R pyelonephritis with associated sepsis w/o shock and acute metabolic encephalopathy.  PMH includes hypothyroid, osteoporosis, remote R breast CA s/p lumpectomy and chemo tx (no longer on treatment), HLD, R forearm fx 05/22/20, CTR, knee sx, R wrist sx 2010.  Clinical Impression  Prior to hospital admission, pt was independent with functional mobility; lives with her husband in 1 level home with multiple steps to enter.  No c/o pain during session.  Currently pt is CGA with transfers and CGA to ambulate 4 feet forward/backwards with RW use (limited distance d/t pt fatigue/generalized weakness).  Pt demonstrating impaired activity tolerance and SOB with activities (O2 sats 94% or greater on 2 L O2 via nasal cannula during sessions activities).  Pt would currently benefit from skilled PT to address noted impairments and functional limitations (see below for any additional details).  Upon hospital discharge, pt would benefit from ongoing therapy; pt and pt's husband requesting home discharge from hospital.    Recommendations for follow up therapy are one component of a multi-disciplinary discharge planning process, led by the attending physician.  Recommendations may be updated based on patient status, additional functional criteria and insurance authorization.        Assistance Recommended at Discharge Frequent or constant Supervision/Assistance  Patient can return home with the following  A lot of help with walking and/or transfers;A lot of help with bathing/dressing/bathroom;Assistance with cooking/housework;Assist for transportation;Help with stairs or ramp for entrance     Equipment Recommendations Rolling walker (2 wheels);Wheelchair (measurements PT);Wheelchair cushion (measurements PT)  Recommendations for Other Services  OT consult    Functional Status Assessment Patient has had a recent decline in their functional status and demonstrates the ability to make significant improvements in function in a reasonable and predictable amount of time.     Precautions / Restrictions Precautions Precautions: Fall Restrictions Weight Bearing Restrictions: No      Mobility  Bed Mobility               General bed mobility comments: Deferred (pt in recliner beginning/end of session)    Transfers Overall transfer level: Needs assistance Equipment used: Rolling walker (2 wheels) Transfers: Sit to/from Stand Sit to Stand: Min guard           General transfer comment: vc's for UE placement; increased effort to stand    Ambulation/Gait Ambulation/Gait assistance: Min guard Gait Distance (Feet):  (4 feet forward and then 4 feet backward) Assistive device: Rolling walker (2 wheels)   Gait velocity: decreased     General Gait Details: decreased B LE step length/foot clearance/heelstrike; increased effort to take steps  Stairs            Wheelchair Mobility    Modified Rankin (Stroke Patients Only)       Balance Overall balance assessment: Needs assistance Sitting-balance support: No upper extremity supported, Feet supported Sitting balance-Leahy Scale: Good Sitting balance - Comments: steady sitting reaching within BOS   Standing balance support: Bilateral upper extremity supported, Reliant on assistive device for balance Standing balance-Leahy Scale: Fair Standing balance comment: steady static standing with B UE support on RW  Pertinent Vitals/Pain Pain Assessment Pain Assessment: No/denies pain HR stable and WNL throughout treatment session.    Home Living Family/patient expects to  be discharged to:: Private residence Living Arrangements: Spouse/significant other Available Help at Discharge: Family;Available 24 hours/day Type of Home: House Home Access: Stairs to enter   [employer] of Steps: 8-10 steps with B railings (can reach both) or 5 steps with B railings (unable to reach both)   Home Layout: One level Home Equipment: Shower seat - built in;BSC/3in1;Cane - single point      Prior Function Prior Level of Function : Independent/Modified Independent             Mobility Comments: 1 fall about 1 month ago (tripped in home).  Independent with ambulation.       Hand Dominance        Extremity/Trunk Assessment   Upper Extremity Assessment Upper Extremity Assessment: Generalized weakness    Lower Extremity Assessment Lower Extremity Assessment: Generalized weakness    Cervical / Trunk Assessment Cervical / Trunk Assessment: Normal  Communication   Communication: No difficulties  Cognition Arousal/Alertness: Awake/alert Behavior During Therapy: WFL for tasks assessed/performed Overall Cognitive Status: Within Functional Limits for tasks assessed                                 General Comments: Alert and oriented x4; follows cues well during session        General Comments  Nursing cleared pt for participation in physical therapy.  Pt agreeable to PT session.  Pt's husband present during session.    Exercises     Assessment/Plan    PT Assessment Patient needs continued PT services  PT Problem List Decreased strength;Decreased activity tolerance;Decreased balance;Decreased mobility;Decreased knowledge of use of DME;Decreased knowledge of precautions       PT Treatment Interventions DME instruction;Gait training;Stair training;Functional mobility training;Therapeutic activities;Therapeutic exercise;Balance training;Patient/family education    PT Goals (Current goals can be found in the Care Plan section)   Acute Rehab PT Goals Patient Stated Goal: to improve strength and go home PT Goal Formulation: With patient/family Time For Goal Achievement: 05/31/22 Potential to Achieve Goals: Fair    Frequency Min 4X/week     Co-evaluation               AM-PAC PT "6 Clicks" Mobility  Outcome Measure Help needed turning from your back to your side while in a flat bed without using bedrails?: None Help needed moving from lying on your back to sitting on the side of a flat bed without using bedrails?: A Little Help needed moving to and from a bed to a chair (including a wheelchair)?: A Little Help needed standing up from a chair using your arms (e.g., wheelchair or bedside chair)?: A Little Help needed to walk in hospital room?: A Lot Help needed climbing 3-5 steps with a railing? : Total 6 Click Score: 16    End of Session Equipment Utilized During Treatment: Gait belt;Oxygen (2 L via nasal cannula) Activity Tolerance: Patient limited by fatigue Patient left: in chair;with call bell/phone within reach;with family/visitor present;Other (comment) (Pt's husband present.  Pt sitting in recliner (no chair alarm pad was in place upon PT arrival).  Nurse reports pt does NOT need chair alarm (pt A&Ox4).  Pt was able to easily verbalize need to call for assistance and wait for help prior to getting up.) Nurse Communication: Mobility status;Precautions PT Visit Diagnosis:  Other abnormalities of gait and mobility (R26.89);Muscle weakness (generalized) (M62.81);History of falling (Z91.81)    Time: 1610-9604 PT Time Calculation (min) (ACUTE ONLY): 29 min   Charges:   PT Evaluation $PT Eval Low Complexity: 1 Low PT Treatments $Therapeutic Activity: 8-22 mins       Hendricks Limes, PT 05/17/22, 10:52 AM

## 2022-05-17 NOTE — Progress Notes (Addendum)
PROGRESS NOTE    MASHA ORBACH  ZOX:[id number] DOB: 02/11/1945 DOA: 05/16/2022 PCP: Glori Luis, MD     Brief Narrative:   From admission h and p SYMANTHA STEEBER is a 77 y.o. female with medical history significant of hypothyroidism  Anxiety ,depression, hyperlipidemia , BRCA  s/p lumpectomy and chemo tx  no in remission since 2021 ,who presents to ED with 3 days of n/v/d/ f/cas well as associated altered mental status. Patient notes she also noted foul odor to urine but no dysuria. She denies any  associated sob/chest pain / flank or abdominal pain.  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 She currently, notes she is feeling improved since treatment in ED.   Assessment & Plan:   Principal Problem:   Acute pyelonephritis Active Problems:   Sepsis secondary to UTI [facility])   Hypothyroidism   Chronic pain syndrome   Bacteremia  # Pyelonephritis # Sepsis # E. Coli bacteremia Presented hypotensive and encephalopathic. Started on cefepime/vanc, fluid resuscitated. Given hx of ESBL abx switched to meropenem. BCs positive 4/4 presumably from her UTI, no resistance gene so after discussion with ID pharm abx transitioned to ceftriaxone. BPs remain soft but patient feels much better today. - continue fluids - continue ceftriaxone - awaiting sensitivities, will continue IV abx until then  # Hypokalemia Mg wnl, k 3.2 - add 20 meq of KCl to fluids  # Thrombocytopenia Plts stable at around 100, likely 2/2 sepsis. Smear unremarkable - hiv and hcv are pending  # Hypothyroid - cont home synthroid  # MDD - cont home sertraline  # Debility PT assessment pending. Patient reports not wanting to go to rehab  # Constipation No bm for a few days, pt reports this is normal for her - miralax/senna ordered   DVT prophylaxis: heparin Code Status: full Family Communication: husband updated @ bedside 4/26  Level of care: Progressive Status is: Inpatient Remains  inpatient appropriate because: need for IV abx    Consultants:  none  Procedures: none  Antimicrobials:  See above    Subjective: Reports still feeling weak but much better, tolerating diet  Objective: Vitals:   05/16/22 1917 05/16/22 2300 05/17/22 0316 05/17/22 0917  BP: 109/74 (!) 108/51 (!) 104/52 (!) 94/59  Pulse: 85 77 79 68  Resp: 18 18 18 16   Temp: (!) 100.4 F (38 C) 99.4 F (37.4 C) 98.6 F (37 C) 98.2 F (36.8 C)  TempSrc: Oral Oral Oral Oral  SpO2: 97% 96% 97% 97%  Weight:      Height:        Intake/Output Summary (Last 24 hours) at 05/17/2022 1030 Last data filed at 05/17/2022 0704 Gross per 24 hour  Intake 2937.66 ml  Output 2800 ml  Net 137.66 ml   Filed Weights   05/16/22 0229 05/16/22 1611  Weight: 72.3 kg 69.1 kg    Examination:  General exam: Appears calm and comfortable  Respiratory system: Clear to auscultation. Respiratory effort normal. Cardiovascular system: S1 & S2 heard, RRR. No JVD, murmurs, rubs, gallops or clicks. No pedal edema. Gastrointestinal system: Abdomen is nondistended, soft and nontender. No organomegaly or masses felt. Normal bowel sounds heard. Central nervous system: Alert and oriented. No focal neurological deficits. Extremities: Symmetric 5 x 5 power. Skin: No rashes, lesions or ulcers Psychiatry: Judgement and insight appear normal. Mood & affect appropriate.     Data Reviewed: I have personally reviewed following labs and imaging studies  CBC: Recent  Labs  Lab 05/16/22 0240 05/16/22 0602 05/17/22 0429  WBC 6.0 7.5  7.4 5.6  NEUTROABS 5.5 6.6  --   HGB 12.7 11.6*  11.5* 10.3*  HCT 39.7 36.5  36.8 31.3*  MCV 92.3 94.3  94.6 90.7  PLT 115* 103*  104* 94*   Basic Metabolic Panel: Recent Labs  Lab 05/16/22 0240 05/16/22 0602 05/17/22 0429  NA 137  --  137  K 3.7  --  3.2*  CL 106  --  108  CO2 22  --  24  GLUCOSE 181*  --  117*  BUN 18  --  11  CREATININE 0.92 0.83 0.62  CALCIUM 8.7*  --   7.8*  MG  --   --  2.0   GFR: Estimated Creatinine Clearance: 53.6 mL/min (by C-G formula based on SCr of 0.62 mg/dL). Liver Function Tests: Recent Labs  Lab 05/16/22 0240 05/17/22 0429  AST 20 22  ALT 17 15  ALKPHOS 68 46  BILITOT 0.6 0.9  PROT 6.9 5.2*  ALBUMIN 3.9 3.0*   Recent Labs  Lab 05/16/22 0240  LIPASE 23   No results for input(s): "AMMONIA" in the last 168 hours. Coagulation Profile: Recent Labs  Lab 05/16/22 0240  INR 1.2   Cardiac Enzymes: No results for input(s): "CKTOTAL", "CKMB", "CKMBINDEX", "TROPONINI" in the last 168 hours. BNP (last 3 results) No results for input(s): "PROBNP" in the last 8760 hours. HbA1C: No results for input(s): "HGBA1C" in the last 72 hours. CBG: No results for input(s): "GLUCAP" in the last 168 hours. Lipid Profile: No results for input(s): "CHOL", "HDL", "LDLCALC", "TRIG", "CHOLHDL", "LDLDIRECT" in the last 72 hours. Thyroid Function Tests: Recent Labs    05/16/22 0240 05/16/22 0602  TSH 5.287*  --   FREET4 0.63 0.65  T3FREE  --  1.9*   Anemia Panel: No results for input(s): "VITAMINB12", "FOLATE", "FERRITIN", "TIBC", "IRON", "RETICCTPCT" in the last 72 hours. Urine analysis:    Component Value Date/Time   COLORURINE YELLOW (A) 05/16/2022 0240   APPEARANCEUR CLOUDY (A) 05/16/2022 0240   APPEARANCEUR Hazy 09/12/2012 1611   LABSPEC 1.015 05/16/2022 0240   LABSPEC 1.020 09/12/2012 1611   PHURINE 6.0 05/16/2022 0240   GLUCOSEU NEGATIVE 05/16/2022 0240   GLUCOSEU Negative 09/12/2012 1611   HGBUR MODERATE (A) 05/16/2022 0240   BILIRUBINUR NEGATIVE 05/16/2022 0240   BILIRUBINUR neg 10/27/2018 0906   BILIRUBINUR Negative 09/12/2012 1611   KETONESUR 5 (A) 05/16/2022 0240   PROTEINUR 100 (A) 05/16/2022 0240   UROBILINOGEN 0.2 10/27/2018 0906   NITRITE POSITIVE (A) 05/16/2022 0240   LEUKOCYTESUR LARGE (A) 05/16/2022 0240   LEUKOCYTESUR 3+ 09/12/2012 1611   Sepsis  Labs: @LABRCNTIP (procalcitonin:4,lacticidven:4)  ) Recent Results (from the past 240 hour(s))  Blood Culture (routine x 2)     Status: None (Preliminary result)   Collection Time: 05/16/22  2:40 AM   Specimen: BLOOD  Result Value Ref Range Status   Specimen Description   Final    BLOOD  LEFT FOREARM Performed at [facility], [address] [place], [place] [address]    Special Requests   Final    BOTTLES DRAWN AEROBIC AND ANAEROBIC Blood Culture results may not be optimal due to an excessive volume of blood received in culture bottles Performed at [facility], [address] [place], [place] [address]    Culture  Setup Time   Final    GRAM NEGATIVE RODS IN BOTH AEROBIC AND ANAEROBIC BOTTLES Organism ID to  follow CRITICAL RESULT CALLED TO, READ BACK BY AND VERIFIED WITH: ALEX CHAPPELL PHARMD 1346 05/16/22 HNM    Culture GRAM NEGATIVE RODS  Final   Report Status PENDING  Incomplete  Blood Culture ID Panel (Reflexed)     Status: Abnormal   Collection Time: 05/16/22  2:40 AM  Result Value Ref Range Status   Enterococcus faecalis NOT DETECTED NOT DETECTED Final   Enterococcus Faecium NOT DETECTED NOT DETECTED Final   Listeria monocytogenes NOT DETECTED NOT DETECTED Final   Staphylococcus species NOT DETECTED NOT DETECTED Final   Staphylococcus aureus (BCID) NOT DETECTED NOT DETECTED Final   Staphylococcus epidermidis NOT DETECTED NOT DETECTED Final   Staphylococcus lugdunensis NOT DETECTED NOT DETECTED Final   Streptococcus species NOT DETECTED NOT DETECTED Final   Streptococcus agalactiae NOT DETECTED NOT DETECTED Final   Streptococcus pneumoniae NOT DETECTED NOT DETECTED Final   Streptococcus pyogenes NOT DETECTED NOT DETECTED Final   A.calcoaceticus-baumannii NOT DETECTED NOT DETECTED Final   Bacteroides fragilis NOT DETECTED NOT DETECTED Final   Enterobacterales DETECTED (A) NOT DETECTED Final    Comment: Enterobacterales represent a large order of gram  negative bacteria, not a single organism. CRITICAL RESULT CALLED TO, READ BACK BY AND VERIFIED WITH: ALEX CHAPPELL PHARMD 1346 05/16/22 HNM    Enterobacter cloacae complex NOT DETECTED NOT DETECTED Final   Escherichia coli DETECTED (A) NOT DETECTED Final    Comment: CRITICAL RESULT CALLED TO, READ BACK BY AND VERIFIED WITH: ALEX CHAPPELL PHARMD 1346 05/16/22 HNM    Klebsiella aerogenes NOT DETECTED NOT DETECTED Final   Klebsiella oxytoca NOT DETECTED NOT DETECTED Final   Klebsiella pneumoniae NOT DETECTED NOT DETECTED Final   Proteus species NOT DETECTED NOT DETECTED Final   Salmonella species NOT DETECTED NOT DETECTED Final   Serratia marcescens NOT DETECTED NOT DETECTED Final   Haemophilus influenzae NOT DETECTED NOT DETECTED Final   Neisseria meningitidis NOT DETECTED NOT DETECTED Final   Pseudomonas aeruginosa NOT DETECTED NOT DETECTED Final   Stenotrophomonas maltophilia NOT DETECTED NOT DETECTED Final   Candida albicans NOT DETECTED NOT DETECTED Final   Candida auris NOT DETECTED NOT DETECTED Final   Candida glabrata NOT DETECTED NOT DETECTED Final   Candida krusei NOT DETECTED NOT DETECTED Final   Candida parapsilosis NOT DETECTED NOT DETECTED Final   Candida tropicalis NOT DETECTED NOT DETECTED Final   Cryptococcus neoformans/gattii NOT DETECTED NOT DETECTED Final   CTX-M ESBL NOT DETECTED NOT DETECTED Final   Carbapenem resistance IMP NOT DETECTED NOT DETECTED Final   Carbapenem resistance KPC NOT DETECTED NOT DETECTED Final   Carbapenem resistance NDM NOT DETECTED NOT DETECTED Final   Carbapenem resist OXA 48 LIKE NOT DETECTED NOT DETECTED Final   Carbapenem resistance VIM NOT DETECTED NOT DETECTED Final    Comment: Performed at [facility], [address] [place], [place] [address]  Blood Culture (routine x 2)     Status: None (Preliminary result)   Collection Time: 05/16/22  2:41 AM   Specimen: BLOOD  Result Value Ref Range Status   Specimen Description    Final    BLOOD  RIGHT FOREARM Performed at [facility], [address] [place], [place] [address]    Special Requests   Final    BOTTLES DRAWN AEROBIC AND ANAEROBIC Blood Culture results may not be optimal due to an excessive volume of blood received in culture bottles Performed at [facility], [address], [place], [place] [address]    Culture  Setup  Time   Final    GRAM NEGATIVE RODS IN BOTH AEROBIC AND ANAEROBIC BOTTLES CRITICAL VALUE NOTED.  VALUE IS CONSISTENT WITH PREVIOUSLY REPORTED AND CALLED VALUE.    Culture GRAM NEGATIVE RODS  Final   Report Status PENDING  Incomplete         Radiology Studies: CT CHEST ABDOMEN PELVIS W CONTRAST  Result Date: 05/16/2022 CLINICAL DATA:  Sepsis. Fever and abdominal pain with nausea and vomiting for the past 3 days EXAM: CT CHEST, ABDOMEN, AND PELVIS WITH CONTRAST TECHNIQUE: Multidetector CT imaging of the chest, abdomen and pelvis was performed following the standard protocol during bolus administration of intravenous contrast. RADIATION DOSE REDUCTION: This exam was performed according to the departmental dose-optimization program which includes automated exposure control, adjustment of the mA and/or kV according to patient size and/or use of iterative reconstruction technique. CONTRAST:  OMNIPAQUE IOHEXOL 300 MG/ML  SOLN COMPARISON:  09/12/2012 abdominal CT FINDINGS: CT CHEST FINDINGS Cardiovascular: Normal heart size. No pericardial effusion. 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 Atheromatous calcification of the aorta. Mediastinum/Nodes: Negative for mass or adenopathy Lungs/Pleura: Mild dependent atelectasis. There is no edema, consolidation, effusion, or pneumothorax. Musculoskeletal: Generalized thoracic spine degeneration which is advanced with mild scoliosis. Sclerosis in line along the lateral right ribs from old fractures. CT ABDOMEN PELVIS FINDINGS Hepatobiliary: Multiple liver cysts  with benign/simple appearance measuring up to 5.4 cm in the right lobe. No evidence of abscess. Small hypervascular area in segment 3 is a shunt based on the delayed phase.No evidence of biliary obstruction or stone. Pancreas: Generalized atrophy. Spleen: Mild enlargement with 5 cm thickness at this similar to before. 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 Adrenals/Urinary Tract: Negative adrenals. Patchy hypoenhancement of the right renal cortex inferiorly with asymmetric right urothelial thickening and perinephric stranding. No abscess, hydronephrosis, or calculus. Unremarkable bladder. Stomach/Bowel: No obstruction. No appendicitis. Numerous colonic diverticula. Vascular/Lymphatic: No acute vascular abnormality. No mass or adenopathy. Reproductive:Hysterectomy Other: No ascites or pneumoperitoneum. Musculoskeletal: Advanced and generalized lumbar spine degeneration with multilevel disc and facet spurring and foraminal impingement. Scoliosis and L5-S1 anterolisthesis. IMPRESSION: Right pyelonephritis without hydronephrosis, stone, or abscess. Electronically Signed   By: Tiburcio Pea M.D.   On: 05/16/2022 04:33   DG Chest Port 1 View  Result Date: 05/16/2022 CLINICAL DATA:  Questionable sepsis. EXAM: PORTABLE CHEST 1 VIEW COMPARISON:  July 08, 2007 FINDINGS: The heart size and mediastinal contours are within normal limits. Low lung volumes are noted with moderate severity elevation of the right hemidiaphragm. Mild to moderate severity diffuse, chronic appearing increased interstitial lung markings are seen. There is no evidence of focal consolidation, pleural effusion or pneumothorax. Radiopaque surgical clips are seen along the lateral aspect of the upper right chest wall. Multilevel degenerative changes are noted throughout the thoracic spine. IMPRESSION: Low lung volumes with chronic appearing increased interstitial lung markings. Electronically Signed   By: Aram Candela  M.D.   On: 05/16/2022 03:24        Scheduled Meds:  gabapentin  100 mg Oral BID   heparin  5,000 Units Subcutaneous Q8H   levothyroxine  75 mcg Oral Q0600   polyethylene glycol  34 g Oral Daily   pravastatin  40 mg Oral q1800   senna  1 tablet Oral Daily   sertraline  100 mg Oral Daily   Continuous Infusions:  0.9 % NaCl with KCl 20 mEq / L     cefTRIAXone (ROCEPHIN)  IV 2 g (05/16/22  2117)     LOS: 1 day     Silvano Bilis, MD [facility]   If 7PM-7AM, please contact night-coverage [email or web address] Password [email or web address] 05/17/2022, 10:30 AM

## 2022-05-17 NOTE — Plan of Care (Signed)

## 2022-05-18 LAB — GASTROINTESTINAL PANEL BY PCR, STOOL (REPLACES STOOL CULTURE)

## 2022-05-18 LAB — BASIC METABOLIC PANEL
Anion gap: 8 (ref 5–15)
BUN: 11 mg/dL (ref 8–23)
CO2: 22 mmol/L (ref 22–32)
Calcium: 8.1 mg/dL — ABNORMAL LOW (ref 8.9–10.3)
Chloride: 111 mmol/L (ref 98–111)
Creatinine, Ser: 0.62 mg/dL (ref 0.44–1.00)
GFR, Estimated: >60 mL/min (ref >60)
Glucose, Bld: 114 mg/dL — ABNORMAL HIGH (ref 70–99)
Potassium: 3.5 mmol/L (ref 3.5–5.1)
Sodium: 141 mmol/L (ref 135–145)

## 2022-05-18 LAB — CBC
HCT: 31.6 % — ABNORMAL LOW (ref 36.0–46.0)
Hemoglobin: 10.4 g/dL — ABNORMAL LOW (ref 12.0–15.0)
MCH: 29.7 pg (ref 26.0–34.0)
MCHC: 32.9 g/dL (ref 30.0–36.0)
MCV: 90.3 fL (ref 80.0–100.0)
Platelets: 96 10*3/uL — ABNORMAL LOW (ref 150–400)
RBC: 3.5 MIL/uL — ABNORMAL LOW (ref 3.87–5.11)
RDW: 12.4 % (ref 11.5–15.5)
WBC: 4.4 10*3/uL (ref 4.0–10.5)
nRBC: 0 % (ref 0.0–0.2)

## 2022-05-18 LAB — URINE CULTURE: Culture: >=100000 — AB

## 2022-05-18 LAB — HEPATIC FUNCTION PANEL
ALT: 17 U/L (ref 0–44)
AST: 24 U/L (ref 15–41)
Albumin: 2.7 g/dL — ABNORMAL LOW (ref 3.5–5.0)
Alkaline Phosphatase: 46 U/L (ref 38–126)
Bilirubin, Direct: 0.2 mg/dL (ref 0.0–0.2)
Indirect Bilirubin: 0.6 mg/dL (ref 0.3–0.9)
Total Bilirubin: 0.8 mg/dL (ref 0.3–1.2)
Total Protein: 5.2 g/dL — ABNORMAL LOW (ref 6.5–8.1)

## 2022-05-18 LAB — C DIFFICILE QUICK SCREEN W PCR REFLEX
C Diff antigen: NEGATIVE
C Diff interpretation: NOT DETECTED
C Diff toxin: NEGATIVE

## 2022-05-18 LAB — HCV AB W REFLEX TO QUANT PCR: HCV Ab: NONREACTIVE

## 2022-05-18 LAB — HCV INTERPRETATION

## 2022-05-18 MED ORDER — DIAZEPAM 5 MG/ML IJ SOLN
5.0000 mg | Freq: Once | INTRAMUSCULAR | Status: AC
  Administered 2022-05-18: 5 mg via INTRAVENOUS
  Filled 2022-05-18: qty 2

## 2022-05-18 NOTE — Plan of Care (Signed)

## 2022-05-18 NOTE — Progress Notes (Signed)
PROGRESS NOTE    Jenny Roberts  ONG:[id number] DOB: September 09, 1945 DOA: 05/16/2022 PCP: Glori Luis, MD     Brief Narrative:   From admission h and p Jenny Roberts is a 77 y.o. female with medical history significant of hypothyroidism  Anxiety ,depression, hyperlipidemia , BRCA  s/p lumpectomy and chemo tx  no in remission since 2021 ,who presents to ED with 3 days of n/v/d/ f/cas well as associated altered mental status. Patient notes she also noted foul odor to urine but no dysuria. She denies any  associated sob/chest pain / flank or abdominal pain.  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 She currently, notes she is feeling improved since treatment in ED.   Assessment & Plan:   Principal Problem:   Acute pyelonephritis Active Problems:   Sepsis secondary to UTI [facility])   Hypothyroidism   Chronic pain syndrome   Bacteremia  # Pyelonephritis secondary to E. coli # Sepsis # E. Coli bacteremia Presented hypotensive and encephalopathic. Started on cefepime/vanc, fluid resuscitated. Given hx of ESBL abx switched to meropenem. BCs positive 4/4 presumably from her UTI, no resistance gene so after discussion with ID pharm abx transitioned to ceftriaxone. Urine culture growing cephalosporin-sensitive e coli. BPs have normalized - continue fluids - continue ceftriaxone given nausea/vomiting, will switch to keflex 500 q6 when able, plan for total 7 days abx - f/u blood culture sensitivities  # Nausea # Diarrhea Was constipated yesterday, miralax/senna ordered. Now with watery diarrhea - stop bowel regimen - pharmacy doesn't think antibiotic change would help if this is antibiotic-associated diarrhea so will continue ceftriaxone for now - check gi pathogen panel and c diff - zofran prn, also will give valium once given severe anxiety and its anti-emetic effects - f/u LFTs  # Hypokalemia resolved - cont 20 meq of KCl to fluids  # Thrombocytopenia Plts stable at  around 100, likely 2/2 sepsis. Smear unremarkable - hiv and hcv are pending  # Hypothyroid - cont home synthroid  # GAD - cont home sertraline  # Debility PT assessment pending. Patient reports not wanting to go to rehab  # Constipation No bm for a few days, pt reports this is normal for her - miralax/senna ordered   DVT prophylaxis: heparin Code Status: full Family Communication: husband updated @ bedside 4/26  Level of care: Progressive Status is: Inpatient Remains inpatient appropriate because: need for IV abx    Consultants:  none  Procedures: none  Antimicrobials:  See above    Subjective: Reports still feeling weak but much better, tolerating diet  Objective: Vitals:   05/17/22 2010 05/17/22 2357 05/18/22 0250 05/18/22 1000  BP: (!) 99/49 (!) 112/57 112/60 125/62  Pulse: 71 70 67 85  Resp: 20  18 (!) 25  Temp: 99.5 F (37.5 C) 98.1 F (36.7 C) 98 F (36.7 C) 98.3 F (36.8 C)  TempSrc: Oral     SpO2: 95% 95% 96% 94%  Weight:    71.7 kg  Height:        Intake/Output Summary (Last 24 hours) at 05/18/2022 1109 Last data filed at 05/18/2022 1046 Gross per 24 hour  Intake 605.83 ml  Output --  Net 605.83 ml   Filed Weights   05/16/22 0229 05/16/22 1611 05/18/22 1000  Weight: 72.3 kg 69.1 kg 71.7 kg    Examination:  General exam: Appears calm and comfortable  Respiratory system: Clear to auscultation. Respiratory effort normal. Cardiovascular system: S1 & S2  heard, RRR. No JVD, murmurs, rubs, gallops or clicks. No pedal edema. Gastrointestinal system: Abdomen is nondistended, soft and nontender. No organomegaly or masses felt. Normal bowel sounds heard. Central nervous system: Alert and oriented. No focal neurological deficits. Extremities: Symmetric 5 x 5 power. Skin: No rashes, lesions or ulcers Psychiatry: Judgement and insight appear normal. Mood & affect appropriate.     Data Reviewed: I have personally reviewed following labs and  imaging studies  CBC: Recent Labs  Lab 05/16/22 0240 05/16/22 0602 05/17/22 0429 05/18/22 0524  WBC 6.0 7.5  7.4 5.6 4.4  NEUTROABS 5.5 6.6  --   --   HGB 12.7 11.6*  11.5* 10.3* 10.4*  HCT 39.7 36.5  36.8 31.3* 31.6*  MCV 92.3 94.3  94.6 90.7 90.3  PLT 115* 103*  104* 94* 96*   Basic Metabolic Panel: Recent Labs  Lab 05/16/22 0240 05/16/22 0602 05/17/22 0429 05/18/22 0524  NA 137  --  137 141  K 3.7  --  3.2* 3.5  CL 106  --  108 111  CO2 22  --  24 22  GLUCOSE 181*  --  117* 114*  BUN 18  --  11 11  CREATININE 0.92 0.83 0.62 0.62  CALCIUM 8.7*  --  7.8* 8.1*  MG  --   --  2.0  --    GFR: Estimated Creatinine Clearance: 54.6 mL/min (by C-G formula based on SCr of 0.62 mg/dL). Liver Function Tests: Recent Labs  Lab 05/16/22 0240 05/17/22 0429  AST 20 22  ALT 17 15  ALKPHOS 68 46  BILITOT 0.6 0.9  PROT 6.9 5.2*  ALBUMIN 3.9 3.0*   Recent Labs  Lab 05/16/22 0240  LIPASE 23   No results for input(s): "AMMONIA" in the last 168 hours. Coagulation Profile: Recent Labs  Lab 05/16/22 0240  INR 1.2   Cardiac Enzymes: No results for input(s): "CKTOTAL", "CKMB", "CKMBINDEX", "TROPONINI" in the last 168 hours. BNP (last 3 results) No results for input(s): "PROBNP" in the last 8760 hours. HbA1C: No results for input(s): "HGBA1C" in the last 72 hours. CBG: No results for input(s): "GLUCAP" in the last 168 hours. Lipid Profile: No results for input(s): "CHOL", "HDL", "LDLCALC", "TRIG", "CHOLHDL", "LDLDIRECT" in the last 72 hours. Thyroid Function Tests: Recent Labs    05/16/22 0240 05/16/22 0602  TSH 5.287*  --   FREET4 0.63 0.65  T3FREE  --  1.9*   Anemia Panel: No results for input(s): "VITAMINB12", "FOLATE", "FERRITIN", "TIBC", "IRON", "RETICCTPCT" in the last 72 hours. Urine analysis:    Component Value Date/Time   COLORURINE YELLOW (A) 05/16/2022 0240   APPEARANCEUR CLOUDY (A) 05/16/2022 0240   APPEARANCEUR Hazy 09/12/2012 1611    LABSPEC 1.015 05/16/2022 0240   LABSPEC 1.020 09/12/2012 1611   PHURINE 6.0 05/16/2022 0240   GLUCOSEU NEGATIVE 05/16/2022 0240   GLUCOSEU Negative 09/12/2012 1611   HGBUR MODERATE (A) 05/16/2022 0240   BILIRUBINUR NEGATIVE 05/16/2022 0240   BILIRUBINUR neg 10/27/2018 0906   BILIRUBINUR Negative 09/12/2012 1611   KETONESUR 5 (A) 05/16/2022 0240   PROTEINUR 100 (A) 05/16/2022 0240   UROBILINOGEN 0.2 10/27/2018 0906   NITRITE POSITIVE (A) 05/16/2022 0240   LEUKOCYTESUR LARGE (A) 05/16/2022 0240   LEUKOCYTESUR 3+ 09/12/2012 1611   Sepsis Labs: @LABRCNTIP (procalcitonin:4,lacticidven:4)  ) Recent Results (from the past 240 hour(s))  Blood Culture (routine x 2)     Status: Abnormal (Preliminary result)   Collection Time: 05/16/22  2:40 AM   Specimen: BLOOD  Result Value Ref Range Status   Specimen Description   Final    BLOOD  LEFT FOREARM Performed at [facility], [address] [place], [place] [address]    Special Requests   Final    BOTTLES DRAWN AEROBIC AND ANAEROBIC Blood Culture results may not be optimal due to an excessive volume of blood received in culture bottles Performed at [facility], [address] [place], [place] [address]    Culture  Setup Time   Final    GRAM NEGATIVE RODS IN BOTH AEROBIC AND ANAEROBIC BOTTLES CRITICAL RESULT CALLED TO, READ BACK BY AND VERIFIED WITH: ALEX CHAPPELL PHARMD 1346 05/16/22 HNM    Culture (A)  Final    ESCHERICHIA COLI SUSCEPTIBILITIES TO FOLLOW Performed at [facility], [address] [place], [place] [address]    Report Status PENDING  Incomplete  Urine Culture     Status: Abnormal   Collection Time: 05/16/22  2:40 AM   Specimen: Urine, Random  Result Value Ref Range Status   Specimen Description   Final    URINE, RANDOM Performed at [facility], [address] [place], [place] [address]    Special Requests   Final    NONE Reflexed from [id number] Performed at [facility], [address] [place], [place] [address]    Culture >=100,000 COLONIES/mL ESCHERICHIA COLI (A)  Final   Report Status 05/18/2022 FINAL  Final   Organism ID, Bacteria ESCHERICHIA COLI (A)  Final      Susceptibility   Escherichia coli - MIC*    AMPICILLIN >=32 RESISTANT Resistant     CEFAZOLIN <=4 SENSITIVE Sensitive     CEFEPIME <=0.12 SENSITIVE Sensitive     CEFTRIAXONE <=0.25 SENSITIVE Sensitive     CIPROFLOXACIN <=0.25 SENSITIVE Sensitive     GENTAMICIN <=1 SENSITIVE Sensitive     IMIPENEM <=0.25 SENSITIVE Sensitive     NITROFURANTOIN <=16 SENSITIVE Sensitive     TRIMETH/SULFA >=320 RESISTANT Resistant     AMPICILLIN/SULBACTAM 16 INTERMEDIATE Intermediate     PIP/TAZO <=4 SENSITIVE Sensitive     * >=100,000 COLONIES/mL ESCHERICHIA COLI  Blood Culture ID Panel (Reflexed)     Status: Abnormal   Collection Time: 05/16/22  2:40 AM  Result Value Ref Range Status   Enterococcus faecalis NOT DETECTED NOT DETECTED Final   Enterococcus Faecium NOT DETECTED NOT DETECTED Final   Listeria monocytogenes NOT DETECTED NOT DETECTED Final   Staphylococcus species NOT DETECTED NOT DETECTED Final   Staphylococcus aureus (BCID) NOT DETECTED NOT DETECTED Final   Staphylococcus epidermidis NOT DETECTED NOT DETECTED Final   Staphylococcus lugdunensis NOT DETECTED NOT DETECTED Final   Streptococcus species NOT DETECTED NOT DETECTED Final   Streptococcus agalactiae NOT DETECTED NOT DETECTED Final   Streptococcus pneumoniae NOT DETECTED NOT DETECTED Final   Streptococcus pyogenes NOT DETECTED NOT DETECTED Final   A.calcoaceticus-baumannii NOT DETECTED NOT DETECTED Final   Bacteroides fragilis NOT DETECTED NOT DETECTED Final   Enterobacterales DETECTED (A) NOT DETECTED Final    Comment: Enterobacterales represent a large order of gram negative bacteria, not a single organism. CRITICAL RESULT CALLED TO, READ BACK BY AND VERIFIED WITH: ALEX CHAPPELL PHARMD 1346 05/16/22 HNM    Enterobacter  cloacae complex NOT DETECTED NOT DETECTED Final   Escherichia coli DETECTED (A) NOT DETECTED Final    Comment: CRITICAL RESULT CALLED TO, READ BACK BY AND VERIFIED WITH: ALEX CHAPPELL PHARMD 1346 05/16/22 HNM    Klebsiella aerogenes NOT DETECTED  NOT DETECTED Final   Klebsiella oxytoca NOT DETECTED NOT DETECTED Final   Klebsiella pneumoniae NOT DETECTED NOT DETECTED Final   Proteus species NOT DETECTED NOT DETECTED Final   Salmonella species NOT DETECTED NOT DETECTED Final   Serratia marcescens NOT DETECTED NOT DETECTED Final   Haemophilus influenzae NOT DETECTED NOT DETECTED Final   Neisseria meningitidis NOT DETECTED NOT DETECTED Final   Pseudomonas aeruginosa NOT DETECTED NOT DETECTED Final   Stenotrophomonas maltophilia NOT DETECTED NOT DETECTED Final   Candida albicans NOT DETECTED NOT DETECTED Final   Candida auris NOT DETECTED NOT DETECTED Final   Candida glabrata NOT DETECTED NOT DETECTED Final   Candida krusei NOT DETECTED NOT DETECTED Final   Candida parapsilosis NOT DETECTED NOT DETECTED Final   Candida tropicalis NOT DETECTED NOT DETECTED Final   Cryptococcus neoformans/gattii NOT DETECTED NOT DETECTED Final   CTX-M ESBL NOT DETECTED NOT DETECTED Final   Carbapenem resistance IMP NOT DETECTED NOT DETECTED Final   Carbapenem resistance KPC NOT DETECTED NOT DETECTED Final   Carbapenem resistance NDM NOT DETECTED NOT DETECTED Final   Carbapenem resist OXA 48 LIKE NOT DETECTED NOT DETECTED Final   Carbapenem resistance VIM NOT DETECTED NOT DETECTED Final    Comment: Performed at [facility], [address] [place], [place] [address]  Blood Culture (routine x 2)     Status: None (Preliminary result)   Collection Time: 05/16/22  2:41 AM   Specimen: BLOOD  Result Value Ref Range Status   Specimen Description   Final    BLOOD  RIGHT FOREARM Performed at [facility], [address], [place], [place] [address]    Special Requests   Final    BOTTLES DRAWN  AEROBIC AND ANAEROBIC Blood Culture results may not be optimal due to an excessive volume of blood received in culture bottles Performed at [facility], [address] [place], [place] [address]    Culture  Setup Time   Final    GRAM NEGATIVE RODS IN BOTH AEROBIC AND ANAEROBIC BOTTLES CRITICAL VALUE NOTED.  VALUE IS CONSISTENT WITH PREVIOUSLY REPORTED AND CALLED VALUE.    Culture   Final    GRAM NEGATIVE RODS IDENTIFICATION TO FOLLOW Performed at [facility], [address], [place], [place] [address]    Report Status PENDING  Incomplete         Radiology Studies: No results found.      Scheduled Meds:  enoxaparin (LOVENOX) injection  40 mg Subcutaneous QHS   gabapentin  100 mg Oral BID   levothyroxine  75 mcg Oral Q0600   polyethylene glycol  34 g Oral Daily   pravastatin  40 mg Oral q1800   senna  1 tablet Oral Daily   sertraline  100 mg Oral Daily   Continuous Infusions:  sodium chloride 10 mL/hr at 05/17/22 2152   0.9 % NaCl with KCl 20 mEq / L 100 mL/hr at 05/18/22 0946   cefTRIAXone (ROCEPHIN)  IV 2 g (05/17/22 2153)     LOS: 2 days     Silvano Bilis, MD [facility]   If 7PM-7AM, please contact night-coverage [email or web address] Password [facility] 05/18/2022, 11:09 AM

## 2022-05-19 LAB — CBC
HCT: 33 % — ABNORMAL LOW (ref 36.0–46.0)
Hemoglobin: 11 g/dL — ABNORMAL LOW (ref 12.0–15.0)
MCH: 30.1 pg (ref 26.0–34.0)
MCHC: 33.3 g/dL (ref 30.0–36.0)
MCV: 90.2 fL (ref 80.0–100.0)
Platelets: 107 10*3/uL — ABNORMAL LOW (ref 150–400)
RBC: 3.66 MIL/uL — ABNORMAL LOW (ref 3.87–5.11)
RDW: 12.5 % (ref 11.5–15.5)
WBC: 3.6 10*3/uL — ABNORMAL LOW (ref 4.0–10.5)
nRBC: 0 % (ref 0.0–0.2)

## 2022-05-19 LAB — BASIC METABOLIC PANEL
Anion gap: 6 (ref 5–15)
BUN: 6 mg/dL — ABNORMAL LOW (ref 8–23)
CO2: 21 mmol/L — ABNORMAL LOW (ref 22–32)
Calcium: 8.1 mg/dL — ABNORMAL LOW (ref 8.9–10.3)
Chloride: 114 mmol/L — ABNORMAL HIGH (ref 98–111)
Creatinine, Ser: 0.57 mg/dL (ref 0.44–1.00)
GFR, Estimated: >60 mL/min (ref >60)
Glucose, Bld: 105 mg/dL — ABNORMAL HIGH (ref 70–99)
Potassium: 3.4 mmol/L — ABNORMAL LOW (ref 3.5–5.1)
Sodium: 141 mmol/L (ref 135–145)

## 2022-05-19 LAB — CULTURE, BLOOD (ROUTINE X 2)

## 2022-05-19 MED ORDER — LOPERAMIDE HCL 2 MG PO CAPS: 4.0000 mg | ORAL_CAPSULE | Freq: Four times a day (QID) | ORAL | 0 refills | Status: DC | PRN

## 2022-05-19 MED ORDER — CEPHALEXIN 500 MG PO CAPS: 500.0000 mg | ORAL_CAPSULE | Freq: Four times a day (QID) | ORAL | 0 refills | Status: AC

## 2022-05-19 MED ORDER — LOPERAMIDE HCL 2 MG PO CAPS: 4.0000 mg | ORAL_CAPSULE | Freq: Four times a day (QID) | ORAL | Status: DC | PRN

## 2022-05-19 NOTE — TOC Transition Note (Signed)
Transition of Care [facility]) - CM/SW Discharge Note   Patient Details  Name: Jenny Roberts MRN: [id number] Date of Birth: 16-Jun-1945  Transition of Care [facility]) CM/SW Contact:  Kemper Durie, RN Phone Number: 05/19/2022, 10:53 AM   Clinical Narrative:     Spoke with patient, aware of discharge home today.  Lives with husband but daughter will provide transportation home.  Dr. Birdie Sons is PCP, obtains medications from [facility] on [address].  Patient aware of recommendations for SNF for short term rehab, she declines.  She also declines Home Health services.  State she will follow up with PCP and if she needs services at that time, she will ask him to order.  She does agree to have rolling walker, [employer] aware and DME will be delivered to bedside prior to discharge.   Final next level of care: Home/Self Care Barriers to Discharge: Barriers Resolved   Patient Goals and CMS Choice      Discharge Placement                         Discharge Plan and Services Additional resources added to the After Visit Summary for       Post Acute Care Choice: Durable Medical Equipment          DME Arranged: Dan Humphreys rolling DME Agency: [employer] Date DME Agency Contacted: 05/19/22 Time DME Agency Contacted: 1053 Representative spoke with at DME Agency: [employer]            Social Determinants of Health (SDOH) Interventions SDOH Screenings   Food Insecurity: No Food Insecurity (05/16/2022)  Housing: Low Risk  (05/16/2022)  Transportation Needs: No Transportation Needs (05/16/2022)  Utilities: Not At Risk (05/16/2022)  Depression (PHQ2-9): Low Risk  (02/11/2022)  Financial Resource Strain: Low Risk  (08/27/2021)  Physical Activity: Unknown (08/18/2017)  Social Connections: Unknown (08/27/2021)  Stress: No Stress Concern Present (08/27/2021)  Tobacco Use: Low Risk  (05/16/2022)     Readmission Risk Interventions     No data to display

## 2022-05-19 NOTE — Discharge Summary (Signed)
Jenny Roberts ZOX:[id number] DOB: [birth year] DOA: 05/16/2022  PCP: Glori Luis, MD  Admit date: 05/16/2022 Discharge date: 05/19/2022  Time spent: 35 minutes  Recommendations for Outpatient Follow-up:  Pcp f/u 1-2 weeks, check cbc then to ensure resolution of thrombocytopenia     Discharge Diagnoses:  Principal Problem:   Acute pyelonephritis Active Problems:   Sepsis secondary to UTI [facility])   Hypothyroidism   Chronic pain syndrome   Bacteremia   Discharge Condition: improved  Diet recommendation: heart healthy  Filed Weights   05/16/22 0229 05/16/22 1611 05/18/22 1000  Weight: 72.3 kg 69.1 kg 71.7 kg    History of present illness:  From admission h and p Jenny Roberts is a 77 y.o. female with medical history significant of hypothyroidism  Anxiety ,depression, hyperlipidemia , BRCA  s/p lumpectomy and chemo tx  no in remission since 2021 ,who presents to ED with 3 days of n/v/d/ f/cas well as associated altered mental status. Patient notes she also noted foul odor to urine but no dysuria. She denies any  associated sob/chest pain / flank or abdominal pain.  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 She currently, notes she is feeling improved since treatment in ED.   Hospital Course:  # Pyelonephritis secondary to E. coli # Sepsis # E. Coli bacteremia Presented hypotensive and encephalopathic. Started on cefepime/vanc, fluid resuscitated. Given hx of ESBL abx switched to meropenem. BCs positive 4/4 presumably from her UTI, no resistance gene so after discussion with ID pharm abx transitioned to ceftriaxone. Urine culture growing cephalosporin-sensitive e coli. BPs have normalized and patient feeling well. Discharged to complete 7 day course keflex.  # Debility PT advised snf, patient declined that and home health, was discharged w/ a walker.    # Nausea # Diarrhea Was constipated on arrival, miralax/senna ordered. Now with watery diarrhea. C diff and  gi pathogen panel negative. Diarrhea now mostly resolved and patient is tolerating fluids.   # Thrombocytopenia Plts stable at around 100, likely 2/2 sepsis. Smear unremarkable - hiv and hcv are pending    Procedures: none   Consultations: none  Discharge Exam: Vitals:   05/19/22 0408 05/19/22 0826  BP: 126/61 128/67  Pulse: 80 68  Resp: 18   Temp: 97.7 F (36.5 C)   SpO2: 96% 96%    General: NAD Cardiovascular: RRR Respiratory: CTAB Abdomen: soft, non-tender Ext: warm, no edema  Discharge Instructions   Discharge Instructions     Diet - low sodium heart healthy   Complete by: As directed    Increase activity slowly   Complete by: As directed       Allergies as of 05/19/2022       Reactions   Amoxicillin-pot Clavulanate Diarrhea, Other (See Comments)   Has patient had a PCN reaction causing immediate rash, facial/tongue/throat swelling, SOB or lightheadedness with hypotension: No Has patient had a PCN reaction causing severe rash involving mucus membranes or skin necrosis: No Has patient had a PCN reaction that required hospitalization No Has patient had a PCN reaction occurring within the last 10 years: Yes If all of the above answers are "NO", then may proceed with Cephalosporin use.   Codeine Hives, Other (See Comments)   shakes   Latex Rash, Itching        Medication List     TAKE these medications    acetaminophen 500 MG tablet Commonly known as: TYLENOL Take 500 mg by mouth every 6 (six) hours  as needed.   alendronate 70 MG tablet Commonly known as: FOSAMAX Take 1 tablet (70 mg total) by mouth every 7 (seven) days. Take with a full glass of water on an empty stomach.   cephALEXin 500 MG capsule Commonly known as: KEFLEX Take 1 capsule (500 mg total) by mouth 4 (four) times daily for 4 days.   cholecalciferol 1000 units tablet Commonly known as: VITAMIN D Take 1,000 Units by mouth 2 (two) times daily.   clonazePAM 0.5 MG  tablet Commonly known as: KLONOPIN TAKE 1 TABLET(0.5 MG) BY MOUTH DAILY   diclofenac 50 MG EC tablet Commonly known as: VOLTAREN Take 1 tablet (50 mg total) by mouth 2 (two) times daily between meals as needed.   gabapentin 300 MG capsule Commonly known as: Neurontin Take 1 capsule (300 mg total) by mouth 2 (two) times daily.   levothyroxine 75 MCG tablet Commonly known as: SYNTHROID TAKE 1 TABLET(75 MCG) BY MOUTH DAILY   loperamide 2 MG capsule Commonly known as: IMODIUM Take 2 capsules (4 mg total) by mouth every 6 (six) hours as needed for diarrhea or loose stools.   lovastatin 40 MG tablet Commonly known as: MEVACOR TAKE 1 TABLET(40 MG) BY MOUTH DAILY   sertraline 100 MG tablet Commonly known as: ZOLOFT TAKE 1 TABLET BY MOUTH DAILY   traMADol 50 MG tablet Commonly known as: ULTRAM Take 50 mg by mouth every 12 (twelve) hours as needed.               Durable Medical Equipment  (From admission, onward)           Start     Ordered   05/19/22 1032  DME Walker  Once       Question Answer Comment  Walker: With 5 Inch Wheels   Patient needs a walker to treat with the following condition Bacteremia      05/19/22 1032           Allergies  Allergen Reactions   Amoxicillin-Pot Clavulanate Diarrhea and Other (See Comments)    Has patient had a PCN reaction causing immediate rash, facial/tongue/throat swelling, SOB or lightheadedness with hypotension: No Has patient had a PCN reaction causing severe rash involving mucus membranes or skin necrosis: No Has patient had a PCN reaction that required hospitalization No Has patient had a PCN reaction occurring within the last 10 years: Yes If all of the above answers are "NO", then may proceed with Cephalosporin use.    Codeine Hives and Other (See Comments)    shakes   Latex Rash and Itching    Follow-up Information     Glori Luis, MD Follow up.   Specialty: Family Medicine Contact  information: [address] [place] [address] [phone]                  The results of significant diagnostics from this hospitalization (including imaging, microbiology, ancillary and laboratory) are listed below for reference.    Significant Diagnostic Studies: CT CHEST ABDOMEN PELVIS W CONTRAST  Result Date: 05/16/2022 CLINICAL DATA:  Sepsis. Fever and abdominal pain with nausea and vomiting for the past 3 days EXAM: CT CHEST, ABDOMEN, AND PELVIS WITH CONTRAST TECHNIQUE: Multidetector CT imaging of the chest, abdomen and pelvis was performed following the standard protocol during bolus administration of intravenous contrast. RADIATION DOSE REDUCTION: This exam was performed according to the departmental dose-optimization program which includes automated exposure control, adjustment of the mA and/or kV according to  patient size and/or use of iterative reconstruction technique. CONTRAST:  OMNIPAQUE IOHEXOL 300 MG/ML  SOLN COMPARISON:  09/12/2012 abdominal CT FINDINGS: CT CHEST FINDINGS Cardiovascular: Normal heart size. No pericardial effusion. 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 Atheromatous calcification of the aorta. Mediastinum/Nodes: Negative for mass or adenopathy Lungs/Pleura: Mild dependent atelectasis. There is no edema, consolidation, effusion, or pneumothorax. Musculoskeletal: Generalized thoracic spine degeneration which is advanced with mild scoliosis. Sclerosis in line along the lateral right ribs from old fractures. CT ABDOMEN PELVIS FINDINGS Hepatobiliary: Multiple liver cysts with benign/simple appearance measuring up to 5.4 cm in the right lobe. No evidence of abscess. Small hypervascular area in segment 3 is a shunt based on the delayed phase.No evidence of biliary obstruction or stone. Pancreas: Generalized atrophy. Spleen: Mild enlargement with 5 cm thickness at this similar to before. 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 Adrenals/Urinary Tract: Negative adrenals. Patchy hypoenhancement of the right renal cortex inferiorly with asymmetric right urothelial thickening and perinephric stranding. No abscess, hydronephrosis, or calculus. Unremarkable bladder. Stomach/Bowel: No obstruction. No appendicitis. Numerous colonic diverticula. Vascular/Lymphatic: No acute vascular abnormality. No mass or adenopathy. Reproductive:Hysterectomy Other: No ascites or pneumoperitoneum. Musculoskeletal: Advanced and generalized lumbar spine degeneration with multilevel disc and facet spurring and foraminal impingement. Scoliosis and L5-S1 anterolisthesis. IMPRESSION: Right pyelonephritis without hydronephrosis, stone, or abscess. Electronically Signed   By: Tiburcio Pea M.D.   On: 05/16/2022 04:33   DG Chest Port 1 View  Result Date: 05/16/2022 CLINICAL DATA:  Questionable sepsis. EXAM: PORTABLE CHEST 1 VIEW COMPARISON:  July 08, 2007 FINDINGS: The heart size and mediastinal contours are within normal limits. Low lung volumes are noted with moderate severity elevation of the right hemidiaphragm. Mild to moderate severity diffuse, chronic appearing increased interstitial lung markings are seen. There is no evidence of focal consolidation, pleural effusion or pneumothorax. Radiopaque surgical clips are seen along the lateral aspect of the upper right chest wall. Multilevel degenerative changes are noted throughout the thoracic spine. IMPRESSION: Low lung volumes with chronic appearing increased interstitial lung markings. Electronically Signed   By: Aram Candela M.D.   On: 05/16/2022 03:24    Microbiology: Recent Results (from the past 240 hour(s))  Blood Culture (routine x 2)     Status: Abnormal   Collection Time: 05/16/22  2:40 AM   Specimen: BLOOD  Result Value Ref Range Status   Specimen Description   Final    BLOOD  LEFT FOREARM Performed at [facility], [address]  [place], [place] [address]    Special Requests   Final    BOTTLES DRAWN AEROBIC AND ANAEROBIC Blood Culture results may not be optimal due to an excessive volume of blood received in culture bottles Performed at [facility], [address] [place], [place] [address]    Culture  Setup Time   Final    GRAM NEGATIVE RODS IN BOTH AEROBIC AND ANAEROBIC BOTTLES CRITICAL RESULT CALLED TO, READ BACK BY AND VERIFIED WITH: ALEX CHAPPELL PHARMD 1346 05/16/22 HNM    Culture ESCHERICHIA COLI (A)  Final   Report Status 05/19/2022 FINAL  Final   Organism ID, Bacteria ESCHERICHIA COLI  Final      Susceptibility   Escherichia coli - MIC*    AMPICILLIN >=32 RESISTANT Resistant     CEFEPIME <=0.12 SENSITIVE Sensitive     CEFTAZIDIME <=1 SENSITIVE Sensitive     CEFTRIAXONE <=0.25 SENSITIVE Sensitive     CIPROFLOXACIN <=0.25 SENSITIVE Sensitive  GENTAMICIN <=1 SENSITIVE Sensitive     IMIPENEM <=0.25 SENSITIVE Sensitive     TRIMETH/SULFA >=320 RESISTANT Resistant     AMPICILLIN/SULBACTAM 4 SENSITIVE Sensitive     PIP/TAZO <=4 SENSITIVE Sensitive     * ESCHERICHIA COLI  Urine Culture     Status: Abnormal   Collection Time: 05/16/22  2:40 AM   Specimen: Urine, Random  Result Value Ref Range Status   Specimen Description   Final    URINE, RANDOM Performed at [facility], [address] [place], [place] [address]    Special Requests   Final    NONE Reflexed from [id number] Performed at [facility], [address] [place], [place] [address]    Culture >=100,000 COLONIES/mL ESCHERICHIA COLI (A)  Final   Report Status 05/18/2022 FINAL  Final   Organism ID, Bacteria ESCHERICHIA COLI (A)  Final      Susceptibility   Escherichia coli - MIC*    AMPICILLIN >=32 RESISTANT Resistant     CEFAZOLIN <=4 SENSITIVE Sensitive     CEFEPIME <=0.12 SENSITIVE Sensitive     CEFTRIAXONE <=0.25 SENSITIVE Sensitive     CIPROFLOXACIN <=0.25 SENSITIVE Sensitive     GENTAMICIN <=1 SENSITIVE  Sensitive     IMIPENEM <=0.25 SENSITIVE Sensitive     NITROFURANTOIN <=16 SENSITIVE Sensitive     TRIMETH/SULFA >=320 RESISTANT Resistant     AMPICILLIN/SULBACTAM 16 INTERMEDIATE Intermediate     PIP/TAZO <=4 SENSITIVE Sensitive     * >=100,000 COLONIES/mL ESCHERICHIA COLI  Blood Culture ID Panel (Reflexed)     Status: Abnormal   Collection Time: 05/16/22  2:40 AM  Result Value Ref Range Status   Enterococcus faecalis NOT DETECTED NOT DETECTED Final   Enterococcus Faecium NOT DETECTED NOT DETECTED Final   Listeria monocytogenes NOT DETECTED NOT DETECTED Final   Staphylococcus species NOT DETECTED NOT DETECTED Final   Staphylococcus aureus (BCID) NOT DETECTED NOT DETECTED Final   Staphylococcus epidermidis NOT DETECTED NOT DETECTED Final   Staphylococcus lugdunensis NOT DETECTED NOT DETECTED Final   Streptococcus species NOT DETECTED NOT DETECTED Final   Streptococcus agalactiae NOT DETECTED NOT DETECTED Final   Streptococcus pneumoniae NOT DETECTED NOT DETECTED Final   Streptococcus pyogenes NOT DETECTED NOT DETECTED Final   A.calcoaceticus-baumannii NOT DETECTED NOT DETECTED Final   Bacteroides fragilis NOT DETECTED NOT DETECTED Final   Enterobacterales DETECTED (A) NOT DETECTED Final    Comment: Enterobacterales represent a large order of gram negative bacteria, not a single organism. CRITICAL RESULT CALLED TO, READ BACK BY AND VERIFIED WITH: ALEX CHAPPELL PHARMD 1346 05/16/22 HNM    Enterobacter cloacae complex NOT DETECTED NOT DETECTED Final   Escherichia coli DETECTED (A) NOT DETECTED Final    Comment: CRITICAL RESULT CALLED TO, READ BACK BY AND VERIFIED WITH: ALEX CHAPPELL PHARMD 1346 05/16/22 HNM    Klebsiella aerogenes NOT DETECTED NOT DETECTED Final   Klebsiella oxytoca NOT DETECTED NOT DETECTED Final   Klebsiella pneumoniae NOT DETECTED NOT DETECTED Final   Proteus species NOT DETECTED NOT DETECTED Final   Salmonella species NOT DETECTED NOT DETECTED Final   Serratia  marcescens NOT DETECTED NOT DETECTED Final   Haemophilus influenzae NOT DETECTED NOT DETECTED Final   Neisseria meningitidis NOT DETECTED NOT DETECTED Final   Pseudomonas aeruginosa NOT DETECTED NOT DETECTED Final   Stenotrophomonas maltophilia NOT DETECTED NOT DETECTED Final   Candida albicans NOT DETECTED NOT DETECTED Final   Candida auris NOT DETECTED NOT DETECTED Final   Candida glabrata NOT DETECTED  NOT DETECTED Final   Candida krusei NOT DETECTED NOT DETECTED Final   Candida parapsilosis NOT DETECTED NOT DETECTED Final   Candida tropicalis NOT DETECTED NOT DETECTED Final   Cryptococcus neoformans/gattii NOT DETECTED NOT DETECTED Final   CTX-M ESBL NOT DETECTED NOT DETECTED Final   Carbapenem resistance IMP NOT DETECTED NOT DETECTED Final   Carbapenem resistance KPC NOT DETECTED NOT DETECTED Final   Carbapenem resistance NDM NOT DETECTED NOT DETECTED Final   Carbapenem resist OXA 48 LIKE NOT DETECTED NOT DETECTED Final   Carbapenem resistance VIM NOT DETECTED NOT DETECTED Final    Comment: Performed at [facility], [address], [place], [place] [address]  Blood Culture (routine x 2)     Status: Abnormal   Collection Time: 05/16/22  2:41 AM   Specimen: BLOOD  Result Value Ref Range Status   Specimen Description   Final    BLOOD  RIGHT FOREARM Performed at [facility], [address], [place], [place] [address]    Special Requests   Final    BOTTLES DRAWN AEROBIC AND ANAEROBIC Blood Culture results may not be optimal due to an excessive volume of blood received in culture bottles Performed at [facility], [address] [place], [place] [address]    Culture  Setup Time   Final    GRAM NEGATIVE RODS IN BOTH AEROBIC AND ANAEROBIC BOTTLES CRITICAL VALUE NOTED.  VALUE IS CONSISTENT WITH PREVIOUSLY REPORTED AND CALLED VALUE.    Culture ESCHERICHIA COLI (A)  Final   Report Status 05/19/2022 FINAL  Final  Gastrointestinal Panel by PCR , Stool      Status: None   Collection Time: 05/18/22  1:42 PM   Specimen: Stool  Result Value Ref Range Status   Campylobacter species NOT DETECTED NOT DETECTED Final   Plesimonas shigelloides NOT DETECTED NOT DETECTED Final   Salmonella species NOT DETECTED NOT DETECTED Final   Yersinia enterocolitica NOT DETECTED NOT DETECTED Final   Vibrio species NOT DETECTED NOT DETECTED Final   Vibrio cholerae NOT DETECTED NOT DETECTED Final   Enteroaggregative E coli (EAEC) NOT DETECTED NOT DETECTED Final   Enteropathogenic E coli (EPEC) NOT DETECTED NOT DETECTED Final   Enterotoxigenic E coli (ETEC) NOT DETECTED NOT DETECTED Final   Shiga like toxin producing E coli (STEC) NOT DETECTED NOT DETECTED Final   Shigella/Enteroinvasive E coli (EIEC) NOT DETECTED NOT DETECTED Final   Cryptosporidium NOT DETECTED NOT DETECTED Final   Cyclospora cayetanensis NOT DETECTED NOT DETECTED Final   Entamoeba histolytica NOT DETECTED NOT DETECTED Final   Giardia lamblia NOT DETECTED NOT DETECTED Final   Adenovirus F40/41 NOT DETECTED NOT DETECTED Final   Astrovirus NOT DETECTED NOT DETECTED Final   Norovirus GI/GII NOT DETECTED NOT DETECTED Final   Rotavirus A NOT DETECTED NOT DETECTED Final   Sapovirus (I, II, IV, and V) NOT DETECTED NOT DETECTED Final    Comment: Performed at [facility], [address] [place], [place] [address]  C Difficile Quick Screen w PCR reflex     Status: None   Collection Time: 05/18/22  1:42 PM   Specimen: STOOL  Result Value Ref Range Status   C Diff antigen NEGATIVE NEGATIVE Final   C Diff toxin NEGATIVE NEGATIVE Final   C Diff interpretation No C. difficile detected.  Final    Comment: Performed at [facility], [address] [place], [place] [address]     Labs: Basic Metabolic Panel: Recent Labs  Lab  05/16/22 0240 05/16/22 0602 05/17/22 0429 05/18/22 0524 05/19/22 0528  NA 137  --  137 141 141  K 3.7  --  3.2* 3.5 3.4*  CL 106  --  108 111 114*  CO2  22  --  24 22 21*  GLUCOSE 181*  --  117* 114* 105*  BUN 18  --  11 11 6*  CREATININE 0.92 0.83 0.62 0.62 0.57  CALCIUM 8.7*  --  7.8* 8.1* 8.1*  MG  --   --  2.0  --   --    Liver Function Tests: Recent Labs  Lab 05/16/22 0240 05/17/22 0429 05/18/22 0520  AST 20 22 24   ALT 17 15 17   ALKPHOS 68 46 46  BILITOT 0.6 0.9 0.8  PROT 6.9 5.2* 5.2*  ALBUMIN 3.9 3.0* 2.7*   Recent Labs  Lab 05/16/22 0240  LIPASE 23   No results for input(s): "AMMONIA" in the last 168 hours. CBC: Recent Labs  Lab 05/16/22 0240 05/16/22 0602 05/17/22 0429 05/18/22 0524 05/19/22 0528  WBC 6.0 7.5  7.4 5.6 4.4 3.6*  NEUTROABS 5.5 6.6  --   --   --   HGB 12.7 11.6*  11.5* 10.3* 10.4* 11.0*  HCT 39.7 36.5  36.8 31.3* 31.6* 33.0*  MCV 92.3 94.3  94.6 90.7 90.3 90.2  PLT 115* 103*  104* 94* 96* 107*   Cardiac Enzymes: No results for input(s): "CKTOTAL", "CKMB", "CKMBINDEX", "TROPONINI" in the last 168 hours. BNP: BNP (last 3 results) No results for input(s): "BNP" in the last 8760 hours.  ProBNP (last 3 results) No results for input(s): "PROBNP" in the last 8760 hours.  CBG: No results for input(s): "GLUCAP" in the last 168 hours.     Signed:  Silvano Bilis MD.  [facility] 05/19/2022, 10:35 AM

## 2022-05-20 ENCOUNTER — Telehealth: Payer: Self-pay | Admitting: *Deleted

## 2022-05-20 NOTE — Transitions of Care (Post Inpatient/ED Visit) (Signed)
   05/20/2022  Name: Jenny Roberts MRN: [id number] DOB: August 14, 1945  Today's TOC FU Call Status: Today's TOC FU Call Status:: Successful TOC FU Call Competed TOC FU Call Complete Date: 05/20/22  Transition Care Management Follow-up Telephone Call Date of Discharge: 05/19/22 Discharge Facility: [facility])  Items Reviewed: Do you have support at home?: No  Home Care and Equipment/Supplies: Were Home Health Services Ordered?: No Any new equipment or medical supplies ordered?: No  Functional Questionnaire: Do you need assistance with bathing/showering or dressing?: No Do you need assistance with meal preparation?: No Do you need assistance with eating?: No Do you have difficulty maintaining continence: No Do you have difficulty managing or taking your medications?: No  Follow up appointments reviewed: PCP Follow-up appointment confirmed?: Yes Date of PCP follow-up appointment?: 05/27/22 Follow-up Provider: Dr Birdie Sons [phone] [facility] Follow-up appointment confirmed?: NA Do you need transportation to your follow-up appointment?: No Do you understand care options if your condition(s) worsen?: Yes-patient verbalized understanding  SDOH Interventions Today    Flowsheet Row Most Recent Value  SDOH Interventions   Food Insecurity Interventions Intervention Not Indicated  Housing Interventions Intervention Not Indicated  Transportation Interventions Intervention Not Indicated      Interventions Today    Flowsheet Row Most Recent Value  General Interventions   General Interventions Discussed/Reviewed General Interventions Discussed, General Interventions Reviewed, Doctor Visits  Doctor Visits Discussed/Reviewed Doctor Visits Discussed, Doctor Visits Reviewed  Pharmacy Interventions   Pharmacy Dicussed/Reviewed Pharmacy Topics Discussed, Pharmacy Topics Reviewed      TOC Interventions Today    Flowsheet Row Most Recent Value   TOC Interventions   TOC Interventions Discussed/Reviewed TOC Interventions Discussed, TOC Interventions Reviewed, Arranged PCP follow up less than 12 days/Care Guide scheduled       Gean Maidens BSN RN [facility] [phone]

## 2022-05-21 ENCOUNTER — Encounter: Payer: Medicare Other | Admitting: Student in an Organized Health Care Education/Training Program

## 2022-05-25 ENCOUNTER — Other Ambulatory Visit: Payer: Self-pay | Admitting: Family Medicine

## 2022-05-25 DIAGNOSIS — E039 Hypothyroidism, unspecified: Secondary | ICD-10-CM

## 2022-05-27 ENCOUNTER — Encounter: Payer: Self-pay | Admitting: Family Medicine

## 2022-05-27 ENCOUNTER — Ambulatory Visit (INDEPENDENT_AMBULATORY_CARE_PROVIDER_SITE_OTHER): Payer: Medicare Other | Admitting: Family Medicine

## 2022-05-27 VITALS — BP 122/72 | HR 82 | Temp 98.0°F | Ht 62.0 in | Wt 149.0 lb

## 2022-05-27 DIAGNOSIS — N390 Urinary tract infection, site not specified: Secondary | ICD-10-CM

## 2022-05-27 DIAGNOSIS — B3731 Acute candidiasis of vulva and vagina: Secondary | ICD-10-CM | POA: Diagnosis not present

## 2022-05-27 DIAGNOSIS — E039 Hypothyroidism, unspecified: Secondary | ICD-10-CM | POA: Diagnosis not present

## 2022-05-27 DIAGNOSIS — A419 Sepsis, unspecified organism: Secondary | ICD-10-CM

## 2022-05-27 DIAGNOSIS — G8929 Other chronic pain: Secondary | ICD-10-CM

## 2022-05-27 DIAGNOSIS — M25551 Pain in right hip: Secondary | ICD-10-CM | POA: Diagnosis not present

## 2022-05-27 LAB — CBC
HCT: 41.4 % (ref 36.0–46.0)
Hemoglobin: 13.7 g/dL (ref 12.0–15.0)
MCHC: 33.2 g/dL (ref 30.0–36.0)
MCV: 89.7 fl (ref 78.0–100.0)
Platelets: 260 10*3/uL (ref 150.0–400.0)
RBC: 4.61 Mil/uL (ref 3.87–5.11)
RDW: 13 % (ref 11.5–15.5)
WBC: 7.2 10*3/uL (ref 4.0–10.5)

## 2022-05-27 LAB — BASIC METABOLIC PANEL
BUN: 14 mg/dL (ref 6–23)
CO2: 27 mEq/L (ref 19–32)
Calcium: 9.8 mg/dL (ref 8.4–10.5)
Chloride: 103 mEq/L (ref 96–112)
Creatinine, Ser: 0.83 mg/dL (ref 0.40–1.20)
GFR: 68.14 mL/min (ref >60.00)
Glucose, Bld: 98 mg/dL (ref 70–99)
Potassium: 3.9 mEq/L (ref 3.5–5.1)
Sodium: 142 mEq/L (ref 135–145)

## 2022-05-27 MED ORDER — FLUCONAZOLE 150 MG PO TABS
150.0000 mg | ORAL_TABLET | ORAL | 0 refills | Status: AC
Start: 2022-05-27 — End: 2022-05-31

## 2022-05-27 NOTE — Progress Notes (Signed)
[name], MD Phone: [phone]  Jenny Roberts is a 77 y.o. female who presents today for f/u.  UTI/sepsis: Patient presented to the hospital with hypotension and encephalopathy.  She had 3 days of nausea, vomiting, diarrhea.  She also noted foul odor to urine.  She was started on vancomycin and cefepime and then switched over to meropenem given history of ESBL.  Blood cultures were positive for E. coli.  She was transitioned to ceftriaxone and discharged on Keflex.  Patient reports she is doing better at this time.  She does still feel weak to a certain degree though is progressively improving.  She has not had any home health and does not feel as though it needs to be done.  Her appetite has been improving.  She notes no vomiting or nausea.  Her diarrhea that she had in the hospital has resolved and she had a negative infectious workup for that.  She was noted to be thrombocytopenic and needs labs rechecked today.  She notes no dysuria, urinary frequency, or urinary urgency.  She does note some vaginal itching.  Right hip pain: This is a chronic issue.  Not as bad as it was though continues to be an issue.  Notes discomfort in the middle of her right buttock.  The discomfort does go down her leg at times.  Her daughter reports she is wobbly at times.  Hypothyroidism: TSH was up while she was in the hospital.  She continues on her Synthroid.  Social History   Tobacco Use  Smoking Status Never  Smokeless Tobacco Never    Current Outpatient Medications on File Prior to Visit  Medication Sig Dispense Refill   acetaminophen (TYLENOL) 500 MG tablet Take 500 mg by mouth every 6 (six) hours as needed.     alendronate (FOSAMAX) 70 MG tablet Take 1 tablet (70 mg total) by mouth every 7 (seven) days. Take with a full glass of water on an empty stomach. 4 tablet 11   cholecalciferol (VITAMIN D) 1000 units tablet Take 1,000 Units by mouth 2 (two) times daily.     clonazePAM (KLONOPIN) 0.5 MG  tablet TAKE 1 TABLET(0.5 MG) BY MOUTH DAILY 30 tablet 1   diclofenac (VOLTAREN) 50 MG EC tablet Take 1 tablet (50 mg total) by mouth 2 (two) times daily between meals as needed. 30 tablet 1   levothyroxine (SYNTHROID) 75 MCG tablet TAKE 1 TABLET(75 MCG) BY MOUTH DAILY 90 tablet 1   loperamide (IMODIUM) 2 MG capsule Take 2 capsules (4 mg total) by mouth every 6 (six) hours as needed for diarrhea or loose stools. 30 capsule 0   lovastatin (MEVACOR) 40 MG tablet TAKE 1 TABLET(40 MG) BY MOUTH DAILY 90 tablet 3   sertraline (ZOLOFT) 100 MG tablet TAKE 1 TABLET BY MOUTH DAILY 90 tablet 1   traMADol (ULTRAM) 50 MG tablet Take 50 mg by mouth every 12 (twelve) hours as needed.     gabapentin (NEURONTIN) 300 MG capsule Take 1 capsule (300 mg total) by mouth 2 (two) times daily. 60 capsule 3   No current facility-administered medications on file prior to visit.     ROS see history of present illness  Objective  Physical Exam Vitals:   05/27/22 1105  BP: 122/72  Pulse: 82  Temp: 98 F (36.7 C)  SpO2: 97%    BP Readings from Last 3 Encounters:  05/27/22 122/72  05/19/22 128/67  02/11/22 130/80   Wt Readings from Last 3 Encounters:  05/27/22 149  lb (67.6 kg)  05/18/22 158 lb (71.7 kg)  02/11/22 149 lb (67.6 kg)    Physical Exam Constitutional:      General: She is not in acute distress.    Appearance: She is not diaphoretic.  Cardiovascular:     Rate and Rhythm: Normal rate and regular rhythm.     Heart sounds: Normal heart sounds.  Pulmonary:     Effort: Pulmonary effort is normal.     Breath sounds: Normal breath sounds.  Musculoskeletal:     Comments: Reduced internal range of motion right hip with some discomfort, good external range of motion right hip, intact internal and external range of motion left hip, nontender right mid buttock  Skin:    General: Skin is warm and dry.  Neurological:     Mental Status: She is alert.      Assessment/Plan: Please see individual  problem list.  Sepsis secondary to UTI [facility]) Assessment & Plan: Patient symptoms have resolved and she is been adequately treated with antibiotics.  She will monitor for any recurrent symptoms.  Will plan on rechecking her urine at her next visit.  Orders: -     CBC -     Basic metabolic panel  Hypothyroidism, unspecified type Assessment & Plan: Chronic issue.  TSH was up a little bit while she was in the hospital that that is likely really related to her acute illness.  We will plan on rechecking that in 3 months at her follow-up.  She will continue Synthroid 75 mcg daily.   Chronic pain of right hip -     DG HIP UNILAT W OR W/O PELVIS 2-3 VIEWS RIGHT; Future  Right hip pain Assessment & Plan: Chronic issue.  Plan for x-ray to evaluate for underlying joint issue.  Discussed this could be caused by a primary joint issue or piriformis syndrome.  She was given exercises to do for her right hip.  Advised not to do them if they are painful.   Yeast vaginitis Assessment & Plan: Suspect the patient's vaginal itching is related to yeast vaginitis resulting from recent antibiotic use.  Will treat with Diflucan 1 tablet every 3 days for 2 doses.  If not improving she will let us know.  Orders: -     Fluconazole; Take 1 tablet (150 mg total) by mouth every 3 (three) days for 2 doses.  Dispense: 2 tablet; Refill: 0   Return in about 1 day (around 05/28/2022) for x-ray at [facility], 3 months PCP.   [name], MD [facility]

## 2022-05-27 NOTE — Assessment & Plan Note (Signed)
Chronic issue.  Plan for x-ray to evaluate for underlying joint issue.  Discussed this could be caused by a primary joint issue or piriformis syndrome.  She was given exercises to do for her right hip.  Advised not to do them if they are painful.

## 2022-05-27 NOTE — Assessment & Plan Note (Signed)
Suspect the patient's vaginal itching is related to yeast vaginitis resulting from recent antibiotic use.  Will treat with Diflucan 1 tablet every 3 days for 2 doses.  If not improving she will let us know.

## 2022-05-27 NOTE — Assessment & Plan Note (Signed)
Patient symptoms have resolved and she is been adequately treated with antibiotics.  She will monitor for any recurrent symptoms.  Will plan on rechecking her urine at her next visit.

## 2022-05-27 NOTE — Assessment & Plan Note (Signed)
Chronic issue.  TSH was up a little bit while she was in the hospital that that is likely really related to her acute illness.  We will plan on rechecking that in 3 months at her follow-up.  She will continue Synthroid 75 mcg daily.

## 2022-05-27 NOTE — Patient Instructions (Signed)
Hip Exercises Ask your health care provider which exercises are safe for you. Do exercises exactly as told by your provider and adjust them as told. It is normal to feel mild stretching, pulling, tightness, or discomfort as you do these exercises. Stop right away if you feel sudden pain or your pain gets worse. Do not begin these exercises until told by your provider. Stretching and range-of-motion exercises These exercises warm up your muscles and joints and improve the movement and flexibility of your hip. They also help to relieve pain, numbness, and tingling. You may be asked to limit your range of motion if you had a hip replacement. Talk to your provider about these limits. Hamstrings, supine  Lie on your back (supine position). Loop a belt, towel, or exercise band over the ball of your left / right foot. The ball of your foot is on the walking surface, right under your toes. Straighten your left / right knee and slowly pull on the belt, towel, or band to raise your leg until you feel a gentle stretch behind your knee (hamstring). Do not let your knee bend while you do this. Keep your other leg flat on the floor. Hold this position for __________ seconds. Slowly return your leg to the starting position. Repeat __________ times. Complete this exercise __________ times a day. Hip rotation  Lie on your back on a firm surface. With your left / right hand, gently pull your left / right knee toward the shoulder that is on the same side of the body. Stop when your knee is pointing toward the ceiling. Hold your left / right ankle with your other hand. Keeping your knee steady, gently pull your left / right ankle toward your other shoulder until you feel a stretch in your butt. Keep your hips and shoulders firmly planted while you do this stretch. Hold this position for __________ seconds. Repeat __________ times. Complete this exercise __________ times a day. Seated stretch This exercise is  sometimes called hamstrings and adductors stretch. Sit on the floor with your legs stretched wide. Keep your knees straight during this exercise. Keeping your head and back in a straight line, bend at your waist to reach for your left foot (position A). You should feel a stretch in your right inner thigh (adductors). Hold this position for __________ seconds. Then slowly return to the upright position. Keeping your head and back in a straight line, bend at your waist to reach forward (position B). You should feel a stretch behind both of your thighs and knees (hamstrings). Hold this position for __________ seconds. Then slowly return to the upright position. Keeping your head and back in a straight line, bend at your waist to reach for your right foot (position C). You should feel a stretch in your left inner thigh (adductors). Hold this position for __________ seconds. Then slowly return to the upright position. Repeat __________ times. Complete this exercise __________ times a day. Lunge This exercise stretches the muscles of the hip (hip flexors). Place your left / right knee on the floor and bend your other knee so that is directly over your ankle. You should be half-kneeling. Keep good posture with your head over your shoulders. Tighten your butt muscles to point your tailbone downward. This will prevent your back from arching too much. You should feel a gentle stretch in the front of your left / right thigh and hip. If you do not feel a stretch, slide your other foot forward slightly and  then slowly lunge forward with your chest up until your knee once again lines up over your ankle. Make sure your tailbone continues to point downward. Hold this position for __________ seconds. Slowly return to the starting position. Repeat __________ times. Complete this exercise __________ times a day. Strengthening exercises These exercises build strength and endurance in your hip. Endurance is the  ability to use your muscles for a long time, even after they get tired. Bridge This exercise strengthens the muscles of your hip (hip extensors). Lie on your back on a firm surface with your knees bent and your feet flat on the floor. Tighten your butt muscles and lift your bottom off the floor until the trunk of your body and your hips are level with your thighs. Do not arch your back. You should feel the muscles working in your butt and the back of your thighs. If you do not feel these muscles, slide your feet 1-2 inches (2.5-5 cm) farther away from your butt. Hold this position for __________ seconds. Slowly lower your hips to the starting position. Let your muscles relax completely between repetitions. Repeat __________ times. Complete this exercise __________ times a day. Straight leg raises, side-lying This exercise strengthens the muscles that move the hip joint away from the center of the body (hip abductors). Lie on your side with your left / right leg in the top position. Lie so your head, shoulder, hip, and knee line up. You may bend your bottom knee slightly to help you balance. Roll your hips slightly forward, so your hips are stacked directly over each other and your left / right knee is facing forward. Leading with your heel, lift your top leg 4-6 inches (10-15 cm). You should feel the muscles in your top hip lifting. Do not let your foot drift forward. Do not let your knee roll toward the ceiling. Hold this position for __________ seconds. Slowly return to the starting position. Let your muscles relax completely between repetitions. Repeat __________ times. Complete this exercise __________ times a day. Straight leg raises, side-lying This exercise strengthens the muscles that move the hip joint toward the center of the body (hip adductors). Lie on your side with your left / right leg in the bottom position. Lie so your head, shoulder, hip, and knee line up. You may place your  upper foot in front to help you balance. Roll your hips slightly forward, so your hips are stacked directly over each other and your left / right knee is facing forward. Tense the muscles in your inner thigh and lift your bottom leg 4-6 inches (10-15 cm). Hold this position for __________ seconds. Slowly return to the starting position. Let your muscles relax completely between repetitions. Repeat __________ times. Complete this exercise __________ times a day. Straight leg raises, supine This exercise strengthens the muscles in the front of your thigh (quadriceps and hip flexors). Lie on your back (supine position) with your left / right leg extended and your other knee bent. Tense the muscles in the front of your left / right thigh. You should see your kneecap slide up or see increased dimpling just above your knee. Keep these muscles tight as you raise your leg 4-6 inches (10-15 cm) off the floor. Do not let your knee bend. Hold this position for __________ seconds. Keep these muscles tense as you lower your leg. Relax the muscles slowly and completely between repetitions. Repeat __________ times. Complete this exercise __________ times a day. Hip abductors, standing This  exercise strengthens the muscles that move the leg and hip joint away from the center of the body (hip abductors). Tie one end of a rubber exercise band or tubing to a secure surface, such as a chair, table, or pole. Loop the other end of the band or tubing around your left / right ankle. Keeping your ankle with the band or tubing directly opposite the secured end, step away until there is tension in the tubing or band. Hold on to a chair, table, or pole as needed for balance. Lift your left / right leg out to your side. While you do this: Keep your back upright. Keep your shoulders over your hips. Keep your toes pointing forward. Make sure to use your hip muscles to slowly lift your leg. Do not tip your body or  forcefully lift your leg. Hold this position for __________ seconds. Slowly return to the starting position. Repeat __________ times. Complete this exercise __________ times a day. Squats This exercise strengthens the muscles in the front of your thigh (quadriceps). Stand in front of a table, or stand in a doorframe so your feet and knees are in line with the frame. You may place your hands on the table or frame for balance. Slowly bend your knees and lower your hips like you are going to sit in a chair. Keep your lower legs in a straight up-and-down position. Do not let your hips go lower than your knees. Do not bend your knees lower than told by your provider. If your hip pain increases, do not bend as low. Hold this position for ___________ seconds. Slowly push with your legs to return to standing. Do not use your hands to pull yourself to standing. Repeat __________ times. Complete this exercise __________ times a day. This information is not intended to replace advice given to you by your health care provider. Make sure you discuss any questions you have with your health care provider. Document Revised: 09/11/2021 Document Reviewed: 09/11/2021 Elsevier Patient Education  2023 [employer].

## 2022-05-28 ENCOUNTER — Ambulatory Visit
Admission: RE | Admit: 2022-05-28 | Discharge: 2022-05-28 | Disposition: A | Discharge status: Home / Self Care | Payer: Medicare Other | Source: Ambulatory Visit | Attending: Family Medicine | Admitting: Family Medicine

## 2022-05-28 ENCOUNTER — Ambulatory Visit (INDEPENDENT_AMBULATORY_CARE_PROVIDER_SITE_OTHER)
Admission: RE | Admit: 2022-05-28 | Discharge: 2022-05-28 | Disposition: A | Discharge status: Home / Self Care | Payer: Medicare Other | Source: Ambulatory Visit | Attending: Family Medicine | Admitting: Family Medicine

## 2022-05-28 ENCOUNTER — Other Ambulatory Visit: Payer: Medicare Other

## 2022-05-28 DIAGNOSIS — G8929 Other chronic pain: Secondary | ICD-10-CM

## 2022-05-28 DIAGNOSIS — M25551 Pain in right hip: Secondary | ICD-10-CM

## 2022-06-04 ENCOUNTER — Other Ambulatory Visit: Payer: Self-pay | Admitting: Family Medicine

## 2022-06-04 DIAGNOSIS — M1611 Unilateral primary osteoarthritis, right hip: Secondary | ICD-10-CM

## 2022-06-09 ENCOUNTER — Other Ambulatory Visit: Payer: Self-pay

## 2022-06-09 ENCOUNTER — Inpatient Hospital Stay
Admission: EM | Admit: 2022-06-09 | Discharge: 2022-06-11 | DRG: 872 | Disposition: A | Discharge status: Home Health | Payer: Medicare Other | Attending: Student | Admitting: Student

## 2022-06-09 ENCOUNTER — Emergency Department: Payer: Medicare Other

## 2022-06-09 ENCOUNTER — Encounter: Payer: Self-pay | Admitting: Emergency Medicine

## 2022-06-09 DIAGNOSIS — Z885 Allergy status to narcotic agent status: Secondary | ICD-10-CM | POA: Diagnosis not present

## 2022-06-09 DIAGNOSIS — M810 Age-related osteoporosis without current pathological fracture: Secondary | ICD-10-CM | POA: Diagnosis present

## 2022-06-09 DIAGNOSIS — Z923 Personal history of irradiation: Secondary | ICD-10-CM

## 2022-06-09 DIAGNOSIS — M5416 Radiculopathy, lumbar region: Secondary | ICD-10-CM | POA: Diagnosis present

## 2022-06-09 DIAGNOSIS — E785 Hyperlipidemia, unspecified: Secondary | ICD-10-CM | POA: Diagnosis not present

## 2022-06-09 DIAGNOSIS — A419 Sepsis, unspecified organism: Principal | ICD-10-CM

## 2022-06-09 DIAGNOSIS — E876 Hypokalemia: Secondary | ICD-10-CM | POA: Diagnosis not present

## 2022-06-09 DIAGNOSIS — Z79890 Hormone replacement therapy (postmenopausal): Secondary | ICD-10-CM

## 2022-06-09 DIAGNOSIS — R6883 Chills (without fever): Secondary | ICD-10-CM | POA: Diagnosis not present

## 2022-06-09 DIAGNOSIS — Z853 Personal history of malignant neoplasm of breast: Secondary | ICD-10-CM | POA: Diagnosis not present

## 2022-06-09 DIAGNOSIS — Z79899 Other long term (current) drug therapy: Secondary | ICD-10-CM

## 2022-06-09 DIAGNOSIS — N390 Urinary tract infection, site not specified: Secondary | ICD-10-CM | POA: Diagnosis present

## 2022-06-09 DIAGNOSIS — Z800 Family history of malignant neoplasm of digestive organs: Secondary | ICD-10-CM | POA: Diagnosis not present

## 2022-06-09 DIAGNOSIS — E039 Hypothyroidism, unspecified: Secondary | ICD-10-CM | POA: Diagnosis present

## 2022-06-09 DIAGNOSIS — A4151 Sepsis due to Escherichia coli [E. coli]: Principal | ICD-10-CM | POA: Diagnosis present

## 2022-06-09 DIAGNOSIS — Z90710 Acquired absence of both cervix and uterus: Secondary | ICD-10-CM | POA: Diagnosis not present

## 2022-06-09 DIAGNOSIS — Z85828 Personal history of other malignant neoplasm of skin: Secondary | ICD-10-CM | POA: Diagnosis not present

## 2022-06-09 DIAGNOSIS — Z1152 Encounter for screening for COVID-19: Secondary | ICD-10-CM | POA: Diagnosis not present

## 2022-06-09 DIAGNOSIS — D6959 Other secondary thrombocytopenia: Secondary | ICD-10-CM | POA: Diagnosis present

## 2022-06-09 DIAGNOSIS — Z808 Family history of malignant neoplasm of other organs or systems: Secondary | ICD-10-CM

## 2022-06-09 DIAGNOSIS — Z881 Allergy status to other antibiotic agents status: Secondary | ICD-10-CM

## 2022-06-09 DIAGNOSIS — F419 Anxiety disorder, unspecified: Secondary | ICD-10-CM | POA: Diagnosis not present

## 2022-06-09 DIAGNOSIS — Z7983 Long term (current) use of bisphosphonates: Secondary | ICD-10-CM | POA: Diagnosis not present

## 2022-06-09 DIAGNOSIS — F32A Depression, unspecified: Secondary | ICD-10-CM | POA: Diagnosis not present

## 2022-06-09 DIAGNOSIS — Z91040 Latex allergy status: Secondary | ICD-10-CM

## 2022-06-09 DIAGNOSIS — Z9221 Personal history of antineoplastic chemotherapy: Secondary | ICD-10-CM

## 2022-06-09 DIAGNOSIS — M48061 Spinal stenosis, lumbar region without neurogenic claudication: Secondary | ICD-10-CM | POA: Diagnosis present

## 2022-06-09 DIAGNOSIS — Z880 Allergy status to penicillin: Secondary | ICD-10-CM | POA: Diagnosis not present

## 2022-06-09 DIAGNOSIS — R6520 Severe sepsis without septic shock: Secondary | ICD-10-CM | POA: Diagnosis present

## 2022-06-09 LAB — CBC WITH DIFFERENTIAL/PLATELET
Abs Immature Granulocytes: 0.04 10*3/uL (ref 0.00–0.07)
Basophils Absolute: 0 10*3/uL (ref 0.0–0.1)
Basophils Relative: 0 %
Eosinophils Absolute: 0 10*3/uL (ref 0.0–0.5)
Eosinophils Relative: 0 %
HCT: 37.9 % (ref 36.0–46.0)
Hemoglobin: 12.5 g/dL (ref 12.0–15.0)
Immature Granulocytes: 0 %
Lymphocytes Relative: 4 %
Lymphs Abs: 0.4 10*3/uL — ABNORMAL LOW (ref 0.7–4.0)
MCH: 29.6 pg (ref 26.0–34.0)
MCHC: 33 g/dL (ref 30.0–36.0)
MCV: 89.8 fL (ref 80.0–100.0)
Monocytes Absolute: 1 10*3/uL (ref 0.1–1.0)
Monocytes Relative: 11 %
Neutro Abs: 7.7 10*3/uL (ref 1.7–7.7)
Neutrophils Relative %: 85 %
Platelets: 102 10*3/uL — ABNORMAL LOW (ref 150–400)
RBC: 4.22 MIL/uL (ref 3.87–5.11)
RDW: 12.5 % (ref 11.5–15.5)
WBC: 9.1 10*3/uL (ref 4.0–10.5)
nRBC: 0 % (ref 0.0–0.2)

## 2022-06-09 LAB — COMPREHENSIVE METABOLIC PANEL
ALT: 13 U/L (ref 0–44)
AST: 16 U/L (ref 15–41)
Albumin: 4.3 g/dL (ref 3.5–5.0)
Alkaline Phosphatase: 66 U/L (ref 38–126)
Anion gap: 10 (ref 5–15)
BUN: 15 mg/dL (ref 8–23)
CO2: 23 mmol/L (ref 22–32)
Calcium: 8.9 mg/dL (ref 8.9–10.3)
Chloride: 101 mmol/L (ref 98–111)
Creatinine, Ser: 0.76 mg/dL (ref 0.44–1.00)
GFR, Estimated: >60 mL/min (ref >60)
Glucose, Bld: 135 mg/dL — ABNORMAL HIGH (ref 70–99)
Potassium: 3.4 mmol/L — ABNORMAL LOW (ref 3.5–5.1)
Sodium: 134 mmol/L — ABNORMAL LOW (ref 135–145)
Total Bilirubin: 1 mg/dL (ref 0.3–1.2)
Total Protein: 6.9 g/dL (ref 6.5–8.1)

## 2022-06-09 LAB — URINALYSIS, ROUTINE W REFLEX MICROSCOPIC
Bilirubin Urine: NEGATIVE
Glucose, UA: NEGATIVE mg/dL
Hgb urine dipstick: NEGATIVE
Ketones, ur: NEGATIVE mg/dL
Nitrite: POSITIVE — AB
Protein, ur: 100 mg/dL — AB
Specific Gravity, Urine: 1.019 (ref 1.005–1.030)
WBC, UA: >50 WBC/hpf (ref 0–5)
pH: 5 (ref 5.0–8.0)

## 2022-06-09 LAB — PROTIME-INR
INR: 1.2 (ref 0.8–1.2)
Prothrombin Time: 15.9 seconds — ABNORMAL HIGH (ref 11.4–15.2)

## 2022-06-09 LAB — RESP PANEL BY RT-PCR (RSV, FLU A&B, COVID)  RVPGX2
Influenza A by PCR: NEGATIVE
Influenza B by PCR: NEGATIVE
Resp Syncytial Virus by PCR: NEGATIVE
SARS Coronavirus 2 by RT PCR: NEGATIVE

## 2022-06-09 LAB — LACTIC ACID, PLASMA
Lactic Acid, Venous: 1.3 mmol/L (ref 0.5–1.9)
Lactic Acid, Venous: 1.3 mmol/L (ref 0.5–1.9)

## 2022-06-09 MED ORDER — ONDANSETRON HCL 4 MG PO TABS: 4.0000 mg | ORAL_TABLET | Freq: Four times a day (QID) | ORAL | Status: DC | PRN

## 2022-06-09 MED ORDER — BISACODYL 5 MG PO TBEC: 5.0000 mg | DELAYED_RELEASE_TABLET | Freq: Every day | ORAL | Status: DC | PRN

## 2022-06-09 MED ORDER — SODIUM CHLORIDE 0.9 % IV SOLN: 2.0000 g | Freq: Once | INTRAVENOUS | Status: DC

## 2022-06-09 MED ORDER — VANCOMYCIN HCL IN DEXTROSE 1-5 GM/200ML-% IV SOLN: 1000.0000 mg | Freq: Once | INTRAVENOUS | Status: DC

## 2022-06-09 MED ORDER — SERTRALINE HCL 50 MG PO TABS
100.0000 mg | ORAL_TABLET | Freq: Every day | ORAL | Status: DC
  Administered 2022-06-10 – 2022-06-11 (×2): 100 mg via ORAL
  Filled 2022-06-09 (×2): qty 2

## 2022-06-09 MED ORDER — ACETAMINOPHEN 325 MG PO TABS
650.0000 mg | ORAL_TABLET | Freq: Four times a day (QID) | ORAL | Status: DC | PRN
  Administered 2022-06-10 – 2022-06-11 (×3): 650 mg via ORAL
  Filled 2022-06-09 (×3): qty 2

## 2022-06-09 MED ORDER — LACTATED RINGERS IV SOLN: INTRAVENOUS | Status: AC

## 2022-06-09 MED ORDER — SODIUM CHLORIDE 0.9 % IV SOLN: INTRAVENOUS | Status: DC

## 2022-06-09 MED ORDER — GABAPENTIN 300 MG PO CAPS
600.0000 mg | ORAL_CAPSULE | Freq: Every day | ORAL | Status: DC
  Administered 2022-06-10: 600 mg via ORAL
  Filled 2022-06-09: qty 2

## 2022-06-09 MED ORDER — ENOXAPARIN SODIUM 40 MG/0.4ML IJ SOSY
40.0000 mg | PREFILLED_SYRINGE | INTRAMUSCULAR | Status: DC
  Administered 2022-06-10 – 2022-06-11 (×2): 40 mg via SUBCUTANEOUS
  Filled 2022-06-09 (×2): qty 0.4

## 2022-06-09 MED ORDER — LEVOTHYROXINE SODIUM 50 MCG PO TABS
75.0000 ug | ORAL_TABLET | Freq: Every day | ORAL | Status: DC
  Administered 2022-06-10 – 2022-06-11 (×2): 75 ug via ORAL
  Filled 2022-06-09 (×2): qty 2

## 2022-06-09 MED ORDER — ONDANSETRON HCL 4 MG/2ML IJ SOLN
4.0000 mg | Freq: Four times a day (QID) | INTRAMUSCULAR | Status: DC | PRN
  Administered 2022-06-10 (×2): 4 mg via INTRAVENOUS
  Filled 2022-06-09 (×2): qty 2

## 2022-06-09 MED ORDER — ACETAMINOPHEN 650 MG RE SUPP: 650.0000 mg | Freq: Four times a day (QID) | RECTAL | Status: DC | PRN

## 2022-06-09 MED ORDER — SENNOSIDES-DOCUSATE SODIUM 8.6-50 MG PO TABS: 1.0000 | ORAL_TABLET | Freq: Every evening | ORAL | Status: DC | PRN

## 2022-06-09 MED ORDER — METRONIDAZOLE 500 MG/100ML IV SOLN
500.0000 mg | Freq: Once | INTRAVENOUS | Status: AC
  Administered 2022-06-09: 500 mg via INTRAVENOUS
  Filled 2022-06-09: qty 100

## 2022-06-09 MED ORDER — VITAMIN D 25 MCG (1000 UNIT) PO TABS
1000.0000 [IU] | ORAL_TABLET | Freq: Two times a day (BID) | ORAL | Status: DC
  Administered 2022-06-10 – 2022-06-11 (×3): 1000 [IU] via ORAL
  Filled 2022-06-09 (×3): qty 1

## 2022-06-09 MED ORDER — CLONAZEPAM 0.5 MG PO TABS
0.5000 mg | ORAL_TABLET | Freq: Every day | ORAL | Status: DC
  Administered 2022-06-10 – 2022-06-11 (×2): 0.5 mg via ORAL
  Filled 2022-06-09 (×2): qty 1

## 2022-06-09 MED ORDER — VANCOMYCIN HCL 1500 MG/300ML IV SOLN
1500.0000 mg | Freq: Once | INTRAVENOUS | Status: AC
  Administered 2022-06-09: 1500 mg via INTRAVENOUS
  Filled 2022-06-09: qty 300

## 2022-06-09 MED ORDER — SODIUM CHLORIDE 0.9 % IV SOLN
2.0000 g | Freq: Two times a day (BID) | INTRAVENOUS | Status: DC
  Administered 2022-06-10 – 2022-06-11 (×3): 2 g via INTRAVENOUS
  Filled 2022-06-09 (×4): qty 12.5

## 2022-06-09 MED ORDER — PRAVASTATIN SODIUM 20 MG PO TABS
40.0000 mg | ORAL_TABLET | Freq: Every day | ORAL | Status: DC
  Administered 2022-06-10: 40 mg via ORAL
  Filled 2022-06-09: qty 2

## 2022-06-09 MED ORDER — HYDRALAZINE HCL 20 MG/ML IJ SOLN: 5.0000 mg | Freq: Three times a day (TID) | INTRAMUSCULAR | Status: DC | PRN

## 2022-06-09 MED ORDER — LACTATED RINGERS IV BOLUS (SEPSIS)
1000.0000 mL | Freq: Once | INTRAVENOUS | Status: AC
  Administered 2022-06-09: 1000 mL via INTRAVENOUS

## 2022-06-09 MED ORDER — SODIUM CHLORIDE 0.9% FLUSH
3.0000 mL | Freq: Two times a day (BID) | INTRAVENOUS | Status: DC
  Administered 2022-06-10 – 2022-06-11 (×4): 3 mL via INTRAVENOUS

## 2022-06-09 MED ORDER — TRAZODONE HCL 50 MG PO TABS: 25.0000 mg | ORAL_TABLET | Freq: Every evening | ORAL | Status: DC | PRN

## 2022-06-09 MED ORDER — SODIUM CHLORIDE 0.9 % IV SOLN
2.0000 g | Freq: Once | INTRAVENOUS | Status: AC
  Administered 2022-06-09: 2 g via INTRAVENOUS
  Filled 2022-06-09: qty 12.5

## 2022-06-09 MED ORDER — ACETAMINOPHEN 500 MG PO TABS
1000.0000 mg | ORAL_TABLET | Freq: Once | ORAL | Status: AC
  Administered 2022-06-09: 1000 mg via ORAL
  Filled 2022-06-09: qty 2

## 2022-06-09 NOTE — ED Notes (Signed)
This RN called the lab to check on status of CBC results. Per Trula Ore in the lab, they are releasing results now.

## 2022-06-09 NOTE — ED Notes (Signed)
Full rainbow, 1 set of blood cultures and lactic acid sent to the lab.  Pt given cup for UA

## 2022-06-09 NOTE — Code Documentation (Signed)
CODE SEPSIS - PHARMACY COMMUNICATION  **Broad Spectrum Antibiotics should be administered within 1 hour of Sepsis diagnosis**  Time Code Sepsis Called/Page Received: 1757  Antibiotics Ordered: vancomycin, cefepime, metronidazole  Time of 1st antibiotic administration: 1834  Additional action taken by pharmacy:   If necessary, Name of Provider/Nurse Contacted:     Sharen Hones ,PharmD Clinical Pharmacist  06/09/2022  6:14 PM

## 2022-06-09 NOTE — ED Triage Notes (Signed)
Pt to ED via POV for chills. Pt states that her chills started last night. Pt febrile in triage. Pt admitted recently for UTI. Pt states that she finished all of her medications. Pt is A & O at this time.

## 2022-06-09 NOTE — Consult Note (Signed)
Pharmacy Antibiotic Note  ARLAYNE HASSELMAN is a 77 y.o. female admitted on 06/09/2022 with fever, chills. Patient with PMH of recent recent admission for urosepsis and c. Coli bacteremia 4/25-4/28 discharged on PO keflex to complete 7-day course of antibiotics. Of note, patient with history of lumbar stenosis/radiculitis, R hip pain, s/p epidural injection 04/2022. l UA c/f UTI.  Pharmacy has been consulted for cefepime dosing.  Plan: Cefepime 2 gram Q12H  Height: 5\' 3"  (160 cm) Weight: 67.1 kg (148 lb) IBW/kg (Calculated) : 52.4  Temp (24hrs), Avg:101.6 F (38.7 C), Min:101.6 F (38.7 C), Max:101.6 F (38.7 C)  Recent Labs  Lab 06/09/22 1724  WBC 9.1  CREATININE 0.76  LATICACIDVEN 1.3    Estimated Creatinine Clearance: 54.2 mL/min (by C-G formula based on SCr of 0.76 mg/dL).    Allergies  Allergen Reactions   Amoxicillin-Pot Clavulanate Diarrhea and Other (See Comments)    Has patient had a PCN reaction causing immediate rash, facial/tongue/throat swelling, SOB or lightheadedness with hypotension: No Has patient had a PCN reaction causing severe rash involving mucus membranes or skin necrosis: No Has patient had a PCN reaction that required hospitalization No Has patient had a PCN reaction occurring within the last 10 years: Yes If all of the above answers are "NO", then may proceed with Cephalosporin use.    Codeine Hives and Other (See Comments)    shakes   Latex Rash and Itching    Antimicrobials this admission: 5/19 Vancomycin x 1  5/19 metronidazole  5/19 cefepime >>  Dose adjustments this admission:   Microbiology results: 5/19 BCx: pending 5/19 UCx: sent    Thank you for allowing pharmacy to be a part of this patient's care.  Sharen Hones, PharmD, BCPS Clinical Pharmacist   06/09/2022 7:53 PM

## 2022-06-09 NOTE — H&P (Signed)
History and Physical   [facility] - [place] @ [facility] Admission History and Physical [employer], D.O.    Patient Name: Jenny Roberts MR#: [id number] Date of Birth: 1945/08/19 Date of Admission: 06/09/2022  Referring MD/NP/PA: Dr. Lenard Lance Primary Care Physician: Glori Luis, MD  Chief Complaint:  Chief Complaint  Patient presents with   Chills    HPI: Jenny Roberts is a 77 y.o. female with a known history of breast cancer in remission since 2021, anxiety, depression, hyperlipidemia, hypothyroidism presents to the emergency department for evaluation of chills.  Patient was in a usual state of health until 3 weeks ago she was hospitalized for sepsis secondary to urinary tract infection/right-sided pyelo and was treated with cefepime and Vanc, transitioned to Rocephin.  Blood and urine culture was pansensitive E. Coli She was discharged and completed her antibiotics (Keflex).  However chills returned last night.  In the emergency department she was found to to be febrile and tachycardic ,have positive urinalysis and received vancomycin, cefepime and metronidazole  Patient denies weakness, dizziness, chest pain, shortness of breath, N/V/C/D, abdominal pain, dysuria/frequency, changes in mental status.    Otherwise there has been no change in status. Patient has been taking medication as prescribed and there has been no recent change in medication or diet.  No recent antibiotics.  There has been no travel or sick contacts.     Review of Systems:  CONSTITUTIONAL: Positive chills, no fevers, fatigue, weakness, weight gain/loss, headache. EYES: No blurry or double vision. ENT: No tinnitus, postnasal drip, redness or soreness of the oropharynx. RESPIRATORY: No cough, dyspnea, wheeze.  No hemoptysis.  CARDIOVASCULAR: No chest pain, palpitations, syncope, orthopnea. No lower extremity edema.  GASTROINTESTINAL: No nausea, vomiting, abdominal pain, diarrhea, constipation.   No hematemesis, melena or hematochezia. GENITOURINARY: No dysuria, frequency, hematuria. ENDOCRINE: No polyuria or nocturia. No heat or cold intolerance. HEMATOLOGY: No anemia, bruising, bleeding. INTEGUMENTARY: No rashes, ulcers, lesions. MUSCULOSKELETAL: No arthritis, gout. NEUROLOGIC: No numbness, tingling, ataxia, seizure-type activity, weakness. PSYCHIATRIC: No anxiety, depression, insomnia.   Past Medical History:  Diagnosis Date   Anxiety    Breast cancer (HCC) 2001   Right. Treated with lumpectomy followed by Chemotherapy and Radiation therapy   Depression    Hyperlipidemia    Nontraumatic rupture of tendon of thumb 06/15/2014   Personal history of chemotherapy    Personal history of radiation therapy    Right forearm fracture 05/22/2020   Skin cancer 09/2004   treated at [facility]   Thyroid disease     Past Surgical History:  Procedure Laterality Date   ABDOMINAL HYSTERECTOMY  1983   Left parts of each ovary    BREAST BIOPSY Right 2001   positive   BREAST EXCISIONAL BIOPSY Right 1990's   benign   BREAST LUMPECTOMY Right 08/16/1999   Metaplastic carcinoma, grade 3; 1.7 cm, T1c, N0; 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  Focal positive posterior margin.  0/5 lymph nodes.   CARPAL TUNNEL RELEASE  2016   COLONOSCOPY  2008   COLONOSCOPY WITH PROPOFOL N/A 02/16/2018   Procedure: COLONOSCOPY WITH PROPOFOL;  Surgeon: Wyline Mood, MD;  Location: [facility] ENDOSCOPY;  Service: Endoscopy;  Laterality: N/A;   KNEE SURGERY  2003   mole excision  2006   Basal cell    WRIST SURGERY Right 2010     reports that she has never smoked. She has never used smokeless tobacco. She reports that she does not drink alcohol and does not use drugs.  Allergies  Allergen Reactions   Amoxicillin-Pot Clavulanate Diarrhea and Other (See Comments)    Has patient had a PCN reaction causing immediate rash, facial/tongue/throat swelling, SOB or lightheadedness with hypotension: No Has patient had a PCN  reaction causing severe rash involving mucus membranes or skin necrosis: No Has patient had a PCN reaction that required hospitalization No Has patient had a PCN reaction occurring within the last 10 years: Yes If all of the above answers are "NO", then may proceed with Cephalosporin use.    Codeine Hives and Other (See Comments)    shakes   Latex Rash and Itching    Family History  Problem Relation Age of Onset   Brain cancer Father    Cancer Maternal Grandfather 61       Colon Cancer   Breast cancer Neg Hx     Prior to Admission medications   Medication Sig Start Date End Date Taking? Authorizing Provider  acetaminophen (TYLENOL) 500 MG tablet Take 500 mg by mouth every 6 (six) hours as needed.    [provider]  alendronate (FOSAMAX) 70 MG tablet Take 1 tablet (70 mg total) by mouth every 7 (seven) days. Take with a full glass of water on an empty stomach. 10/10/21   Glori Luis, MD  cholecalciferol (VITAMIN D) 1000 units tablet Take 1,000 Units by mouth 2 (two) times daily.    [provider]  clonazePAM (KLONOPIN) 0.5 MG tablet TAKE 1 TABLET(0.5 MG) BY MOUTH DAILY 04/23/22   Glori Luis, MD  diclofenac (VOLTAREN) 50 MG EC tablet Take 1 tablet (50 mg total) by mouth 2 (two) times daily between meals as needed. 10/04/21   Edward Jolly, MD  gabapentin (NEURONTIN) 300 MG capsule Take 1 capsule (300 mg total) by mouth 2 (two) times daily. 01/22/22 05/22/22  Edward Jolly, MD  levothyroxine (SYNTHROID) 75 MCG tablet TAKE 1 TABLET(75 MCG) BY MOUTH DAILY 05/28/22   Glori Luis, MD  loperamide (IMODIUM) 2 MG capsule Take 2 capsules (4 mg total) by mouth every 6 (six) hours as needed for diarrhea or loose stools. 05/19/22   Wouk, Wilfred Curtis, MD  lovastatin (MEVACOR) 40 MG tablet TAKE 1 TABLET(40 MG) BY MOUTH DAILY 02/11/22   Glori Luis, MD  sertraline (ZOLOFT) 100 MG tablet TAKE 1 TABLET BY MOUTH DAILY 05/28/22   Glori Luis, MD  traMADol (ULTRAM)  50 MG tablet Take 50 mg by mouth every 12 (twelve) hours as needed. 04/17/22   [provider]    Physical Exam: Vitals:   06/09/22 1719 06/09/22 1720 06/09/22 1830  BP: 131/83  133/63  Pulse: (!) 105  90  Resp: 18  (!) 21  Temp: (!) 101.6 F (38.7 C)    TempSrc: Oral    SpO2: 94%  97%  Weight:  67.1 kg   Height:  5\' 3"  (1.6 m)     GENERAL: 77 y.o.-year-old white female patient, well-developed, well-nourished lying in the bed in no acute distress.  Pleasant and cooperative.   HEENT: Head atraumatic, normocephalic. Pupils equal. Mucus membranes moist. NECK: Supple. No JVD. CHEST: Normal breath sounds bilaterally. No wheezing, rales, rhonchi or crackles. No use of accessory muscles of respiration.  No reproducible chest wall tenderness.  CARDIOVASCULAR: S1, S2 normal. No murmurs, rubs, or gallops. Cap refill <2 seconds. Pulses intact distally.  ABDOMEN: Soft, nondistended, nontender. No rebound, guarding, rigidity. Normoactive bowel sounds present in all four quadrants.  EXTREMITIES: No pedal edema, cyanosis, or clubbing. No calf tenderness  or Homan's sign.  NEUROLOGIC: The patient is alert and oriented x 3. Cranial nerves II through XII are grossly intact with no focal sensorimotor deficit. PSYCHIATRIC:  Normal affect, mood, thought content. SKIN: Warm, dry, and intact without obvious rash, lesion, or ulcer.    Labs on Admission:  CBC: Recent Labs  Lab 06/09/22 1724  WBC 9.1  NEUTROABS 7.7  HGB 12.5  HCT 37.9  MCV 89.8  PLT 102*   Basic Metabolic Panel: Recent Labs  Lab 06/09/22 1724  NA 134*  K 3.4*  CL 101  CO2 23  GLUCOSE 135*  BUN 15  CREATININE 0.76  CALCIUM 8.9   GFR: Estimated Creatinine Clearance: 54.2 mL/min (by C-G formula based on SCr of 0.76 mg/dL). Liver Function Tests: Recent Labs  Lab 06/09/22 1724  AST 16  ALT 13  ALKPHOS 66  BILITOT 1.0  PROT 6.9  ALBUMIN 4.3   No results for input(s): "LIPASE", "AMYLASE" in the last 168  hours. No results for input(s): "AMMONIA" in the last 168 hours. Coagulation Profile: Recent Labs  Lab 06/09/22 1724  INR 1.2   Cardiac Enzymes: No results for input(s): "CKTOTAL", "CKMB", "CKMBINDEX", "TROPONINI" in the last 168 hours. BNP (last 3 results) No results for input(s): "PROBNP" in the last 8760 hours. HbA1C: No results for input(s): "HGBA1C" in the last 72 hours. CBG: No results for input(s): "GLUCAP" in the last 168 hours. Lipid Profile: No results for input(s): "CHOL", "HDL", "LDLCALC", "TRIG", "CHOLHDL", "LDLDIRECT" in the last 72 hours. Thyroid Function Tests: No results for input(s): "TSH", "T4TOTAL", "FREET4", "T3FREE", "THYROIDAB" in the last 72 hours. Anemia Panel: No results for input(s): "VITAMINB12", "FOLATE", "FERRITIN", "TIBC", "IRON", "RETICCTPCT" in the last 72 hours. Urine analysis:    Component Value Date/Time   COLORURINE YELLOW (A) 06/09/2022 1722   APPEARANCEUR CLOUDY (A) 06/09/2022 1722   APPEARANCEUR Hazy 09/12/2012 1611   LABSPEC 1.019 06/09/2022 1722   LABSPEC 1.020 09/12/2012 1611   PHURINE 5.0 06/09/2022 1722   GLUCOSEU NEGATIVE 06/09/2022 1722   GLUCOSEU Negative 09/12/2012 1611   HGBUR NEGATIVE 06/09/2022 1722   BILIRUBINUR NEGATIVE 06/09/2022 1722   BILIRUBINUR neg 10/27/2018 0906   BILIRUBINUR Negative 09/12/2012 1611   KETONESUR NEGATIVE 06/09/2022 1722   PROTEINUR 100 (A) 06/09/2022 1722   UROBILINOGEN 0.2 10/27/2018 0906   NITRITE POSITIVE (A) 06/09/2022 1722   LEUKOCYTESUR LARGE (A) 06/09/2022 1722   LEUKOCYTESUR 3+ 09/12/2012 1611   Sepsis Labs: @LABRCNTIP (procalcitonin:4,lacticidven:4) ) Recent Results (from the past 240 hour(s))  Resp panel by RT-PCR (RSV, Flu A&B, Covid) Anterior Nasal Swab     Status: None   Collection Time: 06/09/22  6:17 PM   Specimen: Anterior Nasal Swab  Result Value Ref Range Status   SARS Coronavirus 2 by RT PCR NEGATIVE NEGATIVE Final    Comment: (NOTE) SARS-CoV-2 target nucleic acids are  NOT DETECTED.  The SARS-CoV-2 RNA is generally detectable in upper respiratory specimens during the acute phase of infection. The lowest concentration of SARS-CoV-2 viral copies this assay can detect is 138 copies/mL. A negative result does not preclude SARS-Cov-2 infection and should not be used as the sole basis for treatment or other patient management decisions. A negative result may occur with  improper specimen collection/handling, submission of specimen other than nasopharyngeal swab, presence of viral mutation(s) within the areas targeted by this assay, and inadequate number of viral copies(<138 copies/mL). A negative result must be combined with clinical observations, patient history, and epidemiological information. The expected result is Negative.  Fact Sheet for Patients:  BloggerCourse.com  Fact Sheet for Healthcare Providers:  SeriousBroker.it  This test is no t yet approved or cleared by the [place] FDA and  has been authorized for detection and/or diagnosis of SARS-CoV-2 by FDA under an Emergency Use Authorization (EUA). This EUA will remain  in effect (meaning this test can be used) for the duration of the COVID-19 declaration under Section 564(b)(1) of the Act, 21 U.S.C.section 360bbb-3(b)(1), unless the authorization is terminated  or revoked sooner.       Influenza A by PCR NEGATIVE NEGATIVE Final   Influenza B by PCR NEGATIVE NEGATIVE Final    Comment: (NOTE) The Xpert Xpress SARS-CoV-2/FLU/RSV plus assay is intended as an aid in the diagnosis of influenza from Nasopharyngeal swab specimens and should not be used as a sole basis for treatment. Nasal washings and aspirates are unacceptable for Xpert Xpress SARS-CoV-2/FLU/RSV testing.  Fact Sheet for Patients: BloggerCourse.com  Fact Sheet for Healthcare Providers: SeriousBroker.it  This test is not  yet approved or cleared by the [place] FDA and has been authorized for detection and/or diagnosis of SARS-CoV-2 by FDA under an Emergency Use Authorization (EUA). This EUA will remain in effect (meaning this test can be used) for the duration of the COVID-19 declaration under Section 564(b)(1) of the Act, 21 U.S.C. section 360bbb-3(b)(1), unless the authorization is terminated or revoked.     Resp Syncytial Virus by PCR NEGATIVE NEGATIVE Final    Comment: (NOTE) Fact Sheet for Patients: BloggerCourse.com  Fact Sheet for Healthcare Providers: SeriousBroker.it  This test is not yet approved or cleared by the [place] FDA and has been authorized for detection and/or diagnosis of SARS-CoV-2 by FDA under an Emergency Use Authorization (EUA). This EUA will remain in effect (meaning this test can be used) for the duration of the COVID-19 declaration under Section 564(b)(1) of the Act, 21 U.S.C. section 360bbb-3(b)(1), unless the authorization is terminated or revoked.  Performed at [facility], [address] [place], [place] [address]      Radiological Exams on Admission: DG Chest 2 View  Result Date: 06/09/2022 CLINICAL DATA:  Chills EXAM: CHEST - 2 VIEW COMPARISON:  Chest radiograph dated 05/16/2022 FINDINGS: Similar asymmetric elevation of the right hemidiaphragm. No focal consolidations. No pleural effusion or pneumothorax. Similar cardiomediastinal silhouette. No acute osseous abnormality. Surgical clips project over the right axilla. IMPRESSION: No active cardiopulmonary disease. Electronically Signed   By: Agustin Cree M.D.   On: 06/09/2022 18:14    EKG: Normal sinus rhythm at 85 bpm with normal axis and nonspecific ST-T wave changes.   Assessment/Plan  This is a 77 y.o. female with a history of breast cancer in remission since 2021, anxiety, depression, hyperlipidemia, hypothyroidism now being admitted  with:  #. Sepsis secondary to UTI, recent hospitalization for same - Admit to inpatient with telemetry monitoring - IV antibiotics:  - IV fluid hydration - Follow up blood,urine & sputum cultures - Repeat CBC in am.  - Infectious disease consultation has been requested  #. Thrombocytopenia likely 2/2 sepsis, similar levels on prior admission - Monitor CBC  #. Mild hypokalemia - Replace orally - Check mag level  #.  History of anxiety and depression -Continue Klonopin and sertraline  #.  History of hyperlipidemia -Continue lovastatin  #. History of hypothyroidism - Continue levothyroxine  #. History of osteoporosis -May resume Fosamax after discharge  Admission status: Inpatient, telemetry IV Fluids: Normal saline Diet/Nutrition: Heart healthy Consults called: None DVT Px:  Lovenox, SCDs and early ambulation. Code Status: Full Code  Disposition Plan: To home in 1-2 days  All the records are reviewed and case discussed with ED provider. Management plans discussed with the patient and/or family who express understanding and agree with plan of care.  Maxxwell Edgett D.O. on 06/09/2022 at 7:40 PM CC: Primary care physician; Glori Luis, MD   06/09/2022, 7:40 PM

## 2022-06-09 NOTE — Progress Notes (Signed)
Elink following for sepsis protocol. 

## 2022-06-09 NOTE — ED Provider Notes (Signed)
[facility] Provider Note    Event Date/Time   First MD Initiated Contact with Patient 06/09/22 1757     (approximate)  History   Chief Complaint: Chills  HPI  Jenny Roberts is a 77 y.o. female with a past medical history of Zaidi, depression, hyperlipidemia, presents to the emergency department for chills.  According to the daughter patient was discharged from the hospital 3 weeks ago completed 3 additional days of antibiotics for urinary tract infection.  Since that admission the patient has been feeling weak today patient developed chills and increased weakness.  Patient found to be febrile to 101.6 and tachycardic to 105 in the emergency department meeting sepsis criteria.  Physical Exam   Triage Vital Signs: ED Triage Vitals  Enc Vitals Group     BP 06/09/22 1719 131/83     Pulse Rate 06/09/22 1719 (!) 105     Resp 06/09/22 1719 18     Temp 06/09/22 1719 (!) 101.6 F (38.7 C)     Temp Source 06/09/22 1719 Oral     SpO2 06/09/22 1719 94 %     Weight 06/09/22 1720 148 lb (67.1 kg)     Height 06/09/22 1720 5\' 3"  (1.6 m)     Head Circumference --      Peak Flow --      Pain Score 06/09/22 1720 8     Pain Loc --      Pain Edu? --      Excl. in GC? --     Most recent vital signs: Vitals:   06/09/22 1719  BP: 131/83  Pulse: (!) 105  Resp: 18  Temp: (!) 101.6 F (38.7 C)  SpO2: 94%    General: Awake, no distress.  CV:  Good peripheral perfusion.  Regular rate and rhythm  Resp:  Normal effort.  Equal breath sounds bilaterally.  Abd:  No distention.  Soft, nontender.  No rebound or guarding.  Benign abdomen  ED Results / Procedures / Treatments   RADIOLOGY  I have reviewed and interpreted chest x-ray images.  No consolidation seen on my evaluation. Radiology has read the x-ray as negative.   MEDICATIONS ORDERED IN ED: Medications  lactated ringers infusion (has no administration in time range)  lactated ringers bolus 1,000 mL  (1,000 mLs Intravenous New Bag/Given 06/09/22 1817)  metroNIDAZOLE (FLAGYL) IVPB 500 mg (has no administration in time range)  vancomycin (VANCOREADY) IVPB 1500 mg/300 mL (has no administration in time range)  ceFEPIme (MAXIPIME) 2 g in sodium chloride 0.9 % 100 mL IVPB (has no administration in time range)     IMPRESSION / MDM / ASSESSMENT AND PLAN / ED COURSE  I reviewed the triage vital signs and the nursing notes.  Patient's presentation is most consistent with acute presentation with potential threat to life or bodily function.  Patient presents to the emergency department for generalized weakness chills found to be febrile to 101.6 tachycardic to 105 meeting sepsis criteria.  We will check labs, blood cultures, urinalysis, chest x-ray we will start the patient on broad-spectrum antibiotics while awaiting further results.  Patient's labs have resulted showing a reassuring chemistry, lactate of 1.3.  CBC overall reassuring as well with a white blood cell count of 9.1.  Patient's urinalysis however shows significant amount of leukocytes with bacteria and nitrite positive highly suspect the patient's fever is coming from urinary tract infection.  Chest x-ray is clear and patient denies any respiratory symptoms.  We will  continue the patient on broad-spectrum antibiotics admit to the hospital service and continue to closely monitor.  Patient agreeable to plan of care.  I reviewed the patient's discharge summary from 05/19/2022 that time the patient was admitted for pyelonephritis and sepsis found to have E. coli bacteremia.  Very possible the patient may have bacteremia once again blood cultures have been sent.  CRITICAL CARE Performed by: Minna Antis   Total critical care time: 30 minutes  Critical care time was exclusive of separately billable procedures and treating other patients.  Critical care was necessary to treat or prevent imminent or life-threatening deterioration.  Critical  care was time spent personally by me on the following activities: development of treatment plan with patient and/or surrogate as well as nursing, discussions with consultants, evaluation of patient's response to treatment, examination of patient, obtaining history from patient or surrogate, ordering and performing treatments and interventions, ordering and review of laboratory studies, ordering and review of radiographic studies, pulse oximetry and re-evaluation of patient's condition.   FINAL CLINICAL IMPRESSION(S) / ED DIAGNOSES   Sepsis Urinary tract infection   Note:  This document was prepared using Dragon voice recognition software and may include unintentional dictation errors.   Minna Antis, MD 06/09/22 [phone]

## 2022-06-09 NOTE — Consult Note (Signed)
PHARMACY -  BRIEF ANTIBIOTIC NOTE   Pharmacy has received consult(s) for vancomycin and aztreonam from an ED provider.  The patient's profile has been reviewed for ht/wt/allergies/indication/available labs.    One time order(s) placed for  Vancomycin 1500 mg Cefepime 2 gram - after review of listed beta-lactam allergy - diarrhea  Further antibiotics/pharmacy consults should be ordered by admitting physician if indicated.                       Thank you, Sharen Hones, PharmD, BCPS Clinical Pharmacist   06/09/2022  6:15 PM

## 2022-06-10 ENCOUNTER — Inpatient Hospital Stay: Payer: Medicare Other

## 2022-06-10 DIAGNOSIS — A419 Sepsis, unspecified organism: Secondary | ICD-10-CM | POA: Diagnosis not present

## 2022-06-10 DIAGNOSIS — N390 Urinary tract infection, site not specified: Secondary | ICD-10-CM | POA: Diagnosis not present

## 2022-06-10 LAB — COMPREHENSIVE METABOLIC PANEL
ALT: 11 U/L (ref 0–44)
AST: 15 U/L (ref 15–41)
Albumin: 3.5 g/dL (ref 3.5–5.0)
Alkaline Phosphatase: 60 U/L (ref 38–126)
Anion gap: 9 (ref 5–15)
BUN: 14 mg/dL (ref 8–23)
CO2: 20 mmol/L — ABNORMAL LOW (ref 22–32)
Calcium: 8.7 mg/dL — ABNORMAL LOW (ref 8.9–10.3)
Chloride: 108 mmol/L (ref 98–111)
Creatinine, Ser: 0.68 mg/dL (ref 0.44–1.00)
GFR, Estimated: >60 mL/min (ref >60)
Glucose, Bld: 139 mg/dL — ABNORMAL HIGH (ref 70–99)
Potassium: 3.2 mmol/L — ABNORMAL LOW (ref 3.5–5.1)
Sodium: 137 mmol/L (ref 135–145)
Total Bilirubin: 1.1 mg/dL (ref 0.3–1.2)
Total Protein: 5.9 g/dL — ABNORMAL LOW (ref 6.5–8.1)

## 2022-06-10 LAB — CBC
HCT: 34.8 % — ABNORMAL LOW (ref 36.0–46.0)
Hemoglobin: 11.5 g/dL — ABNORMAL LOW (ref 12.0–15.0)
MCH: 29.7 pg (ref 26.0–34.0)
MCHC: 33 g/dL (ref 30.0–36.0)
MCV: 89.9 fL (ref 80.0–100.0)
Platelets: 86 10*3/uL — ABNORMAL LOW (ref 150–400)
RBC: 3.87 MIL/uL (ref 3.87–5.11)
RDW: 12.7 % (ref 11.5–15.5)
WBC: 8.5 10*3/uL (ref 4.0–10.5)
nRBC: 0 % (ref 0.0–0.2)

## 2022-06-10 LAB — APTT: aPTT: 33 seconds (ref 24–36)

## 2022-06-10 LAB — LACTIC ACID, PLASMA
Lactic Acid, Venous: 1 mmol/L (ref 0.5–1.9)
Lactic Acid, Venous: 0.8 mmol/L (ref 0.5–1.9)

## 2022-06-10 LAB — PHOSPHORUS: Phosphorus: 1.6 mg/dL — ABNORMAL LOW (ref 2.5–4.6)

## 2022-06-10 LAB — CULTURE, BLOOD (ROUTINE X 2): Culture: NO GROWTH

## 2022-06-10 LAB — PROTIME-INR
INR: 1.4 — ABNORMAL HIGH (ref 0.8–1.2)
Prothrombin Time: 17 seconds — ABNORMAL HIGH (ref 11.4–15.2)

## 2022-06-10 LAB — MAGNESIUM: Magnesium: 1.9 mg/dL (ref 1.7–2.4)

## 2022-06-10 MED ORDER — POTASSIUM PHOSPHATES 15 MMOLE/5ML IV SOLN
30.0000 mmol | Freq: Once | INTRAVENOUS | Status: AC
  Administered 2022-06-10: 30 mmol via INTRAVENOUS
  Filled 2022-06-10: qty 10

## 2022-06-10 MED ORDER — ENSURE ENLIVE PO LIQD
237.0000 mL | Freq: Three times a day (TID) | ORAL | Status: DC
  Administered 2022-06-10: 237 mL via ORAL

## 2022-06-10 MED ORDER — POTASSIUM CHLORIDE CRYS ER 20 MEQ PO TBCR: 40.0000 meq | EXTENDED_RELEASE_TABLET | Freq: Once | ORAL | Status: DC

## 2022-06-10 MED ORDER — POTASSIUM CHLORIDE CRYS ER 20 MEQ PO TBCR
40.0000 meq | EXTENDED_RELEASE_TABLET | Freq: Once | ORAL | Status: AC
  Administered 2022-06-10: 40 meq via ORAL
  Filled 2022-06-10: qty 2

## 2022-06-10 NOTE — Progress Notes (Signed)
[facility] Progress Note  Patient: Jenny Roberts    ZOX:[id number]  DOA: 06/09/2022     Date of Service: the patient was seen and examined on 06/10/2022  Chief Complaint  Patient presents with   Chills   Brief hospital course: Jenny Roberts is a 77 y.o. female with a known history of breast cancer in remission since 2021, anxiety, depression, hyperlipidemia, hypothyroidism presents to the emergency department for evaluation of chills.  Patient was in a usual state of health until 3 weeks ago she was hospitalized for sepsis secondary to urinary tract infection/right-sided pyelo and was treated with cefepime and Vanc, transitioned to Rocephin.  Blood and urine culture was pansensitive E. Coli She was discharged and completed her antibiotics (Keflex).  However chills returned last night.  In the emergency department she was found to to be febrile and tachycardic ,have positive urinalysis and received vancomycin, cefepime and metronidazole     Assessment and Plan:  # Sepsis secondary to UTI, recent hospitalization for same - IV antibiotics:  - IV fluid hydration - Follow up blood,urine & sputum cultures - Repeat CBC in am.  Follow renal sonogram   # Thrombocytopenia likely 2/2 sepsis, similar levels on prior admission - Monitor CBC   # Mild hypokalemia - Replace orally -  mag level wnl  # Hypophosphatemia, Phos repleted. Monitor electrolytes and replete as needed.     #  History of anxiety and depression -Continue Klonopin and sertraline   # History of hyperlipidemia -Continue lovastatin   # History of hypothyroidism - Continue levothyroxine   # History of osteoporosis -May resume Fosamax after discharge  Body mass index is 26.22 kg/m.  Interventions:       Diet: Heart healthy diet DVT Prophylaxis: Subcutaneous Lovenox   Advance goals of care discussion: Full code  Family Communication: family was present at bedside, at the time of interview.  The pt  provided permission to discuss medical plan with the family. Opportunity was given to ask question and all questions were answered satisfactorily.   Disposition:  Pt is from Home, admitted with sepsis due to UTI, still on IV Abx, which precludes a safe discharge. Discharge to Home, when clinically stable..  Subjective: No significant events overnight, patient denies any abdominal pain, no dysuria, no chest pain or palpitation, no nausea vomiting or diarrhea. Patient just feels weak and tired, presented with chills, no active issues at this time.   Physical Exam: General: NAD, lying comfortably Appear in no distress, affect appropriate Eyes: PERRLA ENT: Oral Mucosa Clear, moist  Neck: no JVD,  Cardiovascular: S1 and S2 Present, no Murmur,  Respiratory: good respiratory effort, Bilateral Air entry equal and Decreased, no Crackles, no wheezes Abdomen: Bowel Sound present, Soft and no tenderness,  Skin: no rashes Extremities: no Pedal edema, no calf tenderness Neurologic: without any new focal findings Gait not checked due to patient safety concerns  Vitals:   06/09/22 2100 06/09/22 2245 06/10/22 0423 06/10/22 0804  BP: 108/64 105/74 105/71 (!) 115/49  Pulse: 89 93 83 83  Resp:  20 16 18   Temp:  98.2 F (36.8 C) 98.2 F (36.8 C) 98.2 F (36.8 C)  TempSrc:  Oral Oral   SpO2: 95% 97% 98% 95%  Weight:      Height:        Intake/Output Summary (Last 24 hours) at 06/10/2022 1443 Last data filed at 06/10/2022 1419 Gross per 24 hour  Intake 1371.51 ml  Output --  Net 1371.51 ml  Filed Weights   06/09/22 1720  Weight: 67.1 kg    Data Reviewed: I have personally reviewed and interpreted daily labs, tele strips, imagings as discussed above. I reviewed all nursing notes, pharmacy notes, vitals, pertinent old records I have discussed plan of care as described above with RN and patient/family.  CBC: Recent Labs  Lab 06/09/22 1724 06/10/22 0140  WBC 9.1 8.5  NEUTROABS 7.7   --   HGB 12.5 11.5*  HCT 37.9 34.8*  MCV 89.8 89.9  PLT 102* 86*   Basic Metabolic Panel: Recent Labs  Lab 06/09/22 1724 06/10/22 0136 06/10/22 0140  NA 134*  --  137  K 3.4*  --  3.2*  CL 101  --  108  CO2 23  --  20*  GLUCOSE 135*  --  139*  BUN 15  --  14  CREATININE 0.76  --  0.68  CALCIUM 8.9  --  8.7*  MG  --   --  1.9  PHOS  --  1.6*  --     Studies: DG Chest 2 View  Result Date: 06/09/2022 CLINICAL DATA:  Chills EXAM: CHEST - 2 VIEW COMPARISON:  Chest radiograph dated 05/16/2022 FINDINGS: Similar asymmetric elevation of the right hemidiaphragm. No focal consolidations. No pleural effusion or pneumothorax. Similar cardiomediastinal silhouette. No acute osseous abnormality. Surgical clips project over the right axilla. IMPRESSION: No active cardiopulmonary disease. Electronically Signed   By: Jenny Roberts M.D.   On: 06/09/2022 18:14    Scheduled Meds:  cholecalciferol  1,000 Units Oral BID   clonazePAM  0.5 mg Oral Daily   enoxaparin (LOVENOX) injection  40 mg Subcutaneous Q24H   gabapentin  600 mg Oral QHS   levothyroxine  75 mcg Oral Q0600   potassium chloride  40 mEq Oral Once   pravastatin  40 mg Oral q1800   sertraline  100 mg Oral Daily   sodium chloride flush  3 mL Intravenous Q12H   Continuous Infusions:  sodium chloride 75 mL/hr at 06/10/22 0018   ceFEPime (MAXIPIME) IV Stopped (06/10/22 0800)   potassium PHOSPHATE IVPB (in mmol)     PRN Meds: acetaminophen **OR** acetaminophen, bisacodyl, hydrALAZINE, ondansetron **OR** ondansetron (ZOFRAN) IV, senna-docusate, traZODone  Time spent: 35 minutes  Author: Gillis Santa. MD [facility] 06/10/2022 2:43 PM  To reach On-call, see care teams to locate the attending and reach out to them via [email or web address]. If 7PM-7AM, please contact night-coverage If you still have difficulty reaching the attending provider, please page the [facility] (Director on Call) for [facility] on amion for assistance.

## 2022-06-11 ENCOUNTER — Other Ambulatory Visit: Payer: Self-pay | Admitting: *Deleted

## 2022-06-11 DIAGNOSIS — A419 Sepsis, unspecified organism: Secondary | ICD-10-CM

## 2022-06-11 DIAGNOSIS — N390 Urinary tract infection, site not specified: Secondary | ICD-10-CM | POA: Diagnosis not present

## 2022-06-11 LAB — URINE CULTURE: Culture: NO GROWTH

## 2022-06-11 LAB — MAGNESIUM: Magnesium: 2 mg/dL (ref 1.7–2.4)

## 2022-06-11 LAB — BASIC METABOLIC PANEL
Anion gap: 6 (ref 5–15)
BUN: 9 mg/dL (ref 8–23)
CO2: 23 mmol/L (ref 22–32)
Calcium: 8.4 mg/dL — ABNORMAL LOW (ref 8.9–10.3)
Chloride: 108 mmol/L (ref 98–111)
Creatinine, Ser: 0.56 mg/dL (ref 0.44–1.00)
GFR, Estimated: >60 mL/min (ref >60)
Glucose, Bld: 113 mg/dL — ABNORMAL HIGH (ref 70–99)
Potassium: 3.7 mmol/L (ref 3.5–5.1)
Sodium: 137 mmol/L (ref 135–145)

## 2022-06-11 LAB — PHOSPHORUS: Phosphorus: 2.4 mg/dL — ABNORMAL LOW (ref 2.5–4.6)

## 2022-06-11 LAB — CBC
HCT: 32.7 % — ABNORMAL LOW (ref 36.0–46.0)
Hemoglobin: 10.8 g/dL — ABNORMAL LOW (ref 12.0–15.0)
MCH: 29.3 pg (ref 26.0–34.0)
MCHC: 33 g/dL (ref 30.0–36.0)
MCV: 88.6 fL (ref 80.0–100.0)
Platelets: 97 10*3/uL — ABNORMAL LOW (ref 150–400)
RBC: 3.69 MIL/uL — ABNORMAL LOW (ref 3.87–5.11)
RDW: 12.6 % (ref 11.5–15.5)
WBC: 6.1 10*3/uL (ref 4.0–10.5)
nRBC: 0 % (ref 0.0–0.2)

## 2022-06-11 LAB — CULTURE, BLOOD (ROUTINE X 2)

## 2022-06-11 MED ORDER — CEFADROXIL 500 MG PO CAPS: 500.0000 mg | ORAL_CAPSULE | Freq: Two times a day (BID) | ORAL | 0 refills | Status: AC

## 2022-06-11 MED ORDER — K PHOS MONO-SOD PHOS DI & MONO 155-852-130 MG PO TABS
500.0000 mg | ORAL_TABLET | Freq: Four times a day (QID) | ORAL | Status: DC
  Administered 2022-06-11: 500 mg via ORAL
  Filled 2022-06-11 (×2): qty 2

## 2022-06-11 NOTE — Consult Note (Signed)
[facility] Customer service manager [facility]) Accountable Care Organization (ACO) [facility] Liaison Note  06/11/2022  Jenny Roberts [birth year] [id number]  Location: [facility] RN Hospital Liaison met patient at bedside at [facility].  Insurance: [employer]   [name] is a 77 y.o. female who is a Primary Care Patient of Birdie Sons, Yehuda Mao, MD ([place] [facility]). The patient was screened for 30 day readmission hospitalization with noted medium risk score for unplanned readmission risk with 2 IP in 6 months.  The patient was assessed for potential [facility]) Care Management service needs for post hospital transition for care coordination. Review of patient's electronic medical record reveals patient was admitted for Sepsis.  Spoke with pt and spouse Faylene Million) at bedside today concerning [facility] services and benefits. Pt receptive to a follow up call from the [facility] RN care coordinator. Patient was given an appointment reminder card and 24 hour Nurse Advice Line magnet.   Plan: [facility] Liaison will continue to follow progress and disposition to asess for post hospital community care coordination/management needs.  Referral request for community care coordination: Will refer due to admitting diagnosis and hospital prevention readmission.   [facility] Care Management/Population Health does not replace or interfere with any arrangements made by the Inpatient Transition of Care team.   For questions contact:   Elliot Cousin, RN, BSN [facility] Liaison [place]   [facility]  Population Health Office Hours MTWF  8:00 am-6:00 pm Off on Thursday [phone] mobile [phone] [Office toll free line][facility] Office Hours are M-F 8:30 - 5 pm 24 hour nurse advise line [phone] Concierge  [email or web address]

## 2022-06-11 NOTE — Discharge Summary (Signed)
[facility] Discharge Summary   Patient: Jenny Roberts ZOX:[id number]  PCP: Glori Luis, MD  Date of admission: 06/09/2022   Date of discharge:  06/11/2022     Discharge Diagnoses:  Principal Problem:   Sepsis secondary to UTI [facility])   Admitted From: Home Disposition:  Home with [facility] services  Recommendations for Outpatient Follow-up:  PCP: In 1 week Follow up LABS/TEST: CBC after 1 to 2 weeks to check platelet count and hemoglobin   Diet recommendation: Cardiac diet  Activity: The patient is advised to gradually reintroduce usual activities, as tolerated  Discharge Condition: stable  Code Status: Full code   History of present illness: As per the H and P dictated on admission Hospital Course:  Marg Koebel is a 77 y.o. female with a known history of breast cancer in remission since 2021, anxiety, depression, hyperlipidemia, hypothyroidism presents to the emergency department for evaluation of chills.  Patient was in a usual state of health until 3 weeks ago she was hospitalized for sepsis secondary to urinary tract infection/right-sided pyelo and was treated with cefepime and Vanc, transitioned to Rocephin.  Blood and urine culture was pansensitive E. Coli She was discharged and completed her antibiotics (Keflex).  However chills returned last night.  In the emergency department she was found to to be febrile and tachycardic ,have positive urinalysis and received vancomycin, cefepime and metronidazole  Assessment and Plan: # Sepsis secondary to UTI, recent hospitalization for same. Patient was started on cefepime 2 g IV twice daily.  Patient received a dose of vancomycin, Flagyl which were discontinued. Blood culture and urine culture negative. US Renal: No significant sonographic abnormality of the kidneys.  Patient's vital signs remained stable, asymptomatic.  Patient was discharged on cefadroxil 500 mg p.o. twice daily for 5 days.  Patient was advised to follow-up with  PCP and urologist for recurrent UTI. # Thrombocytopenia likely 2/2 sepsis, similar levels on prior admission.  Platelet count remained stable and improved.  Follow with PCP to repeat CBC after 1 to 2 weeks. # Mild hypokalemia, Replaced orally and resolved # Hypophosphatemia, Phos repleted. #  History of anxiety and depression. continue Klonopin and sertraline # History of hyperlipidemia, Continue lovastatin # History of hypothyroidism, Continue levothyroxine # History of osteoporosis, resumed Fosamax after discharge Body mass index is 26.22 kg/m.  Nutrition Interventions:     On the day of the discharge the patient's vitals were stable, and no other acute medical condition were reported by patient. the patient was felt safe to be discharge at Home with Home health.  Consultants: None Procedures: None  Discharge Exam: General: Appear in no distress, no Rash; Oral Mucosa Clear, moist. Cardiovascular: S1 and S2 Present, no Murmur, Respiratory: normal respiratory effort, Bilateral Air entry present and no Crackles, no wheezes Abdomen: Bowel Sound present, Soft and no tenderness, no hernia Extremities: no Pedal edema, no calf tenderness Neurology: alert and oriented to time, place, and person affect appropriate.  Filed Weights   06/09/22 1720  Weight: 67.1 kg   Vitals:   06/10/22 1947 06/11/22 0806  BP: 129/60 102/61  Pulse: 98 81  Resp: 16 18  Temp: 99.6 F (37.6 C) 98.3 F (36.8 C)  SpO2: 95% 94%    DISCHARGE MEDICATION: Allergies as of 06/11/2022       Reactions   Amoxicillin-pot Clavulanate Diarrhea, Other (See Comments)   Has patient had a PCN reaction causing immediate rash, facial/tongue/throat swelling, SOB or lightheadedness with hypotension: No Has patient had a  PCN reaction causing severe rash involving mucus membranes or skin necrosis: No Has patient had a PCN reaction that required hospitalization No Has patient had a PCN reaction occurring within the last 10  years: Yes If all of the above answers are "NO", then may proceed with Cephalosporin use.   Codeine Hives, Other (See Comments)   shakes   Latex Rash, Itching        Medication List     TAKE these medications    acetaminophen 500 MG tablet Commonly known as: TYLENOL Take 500 mg by mouth every 6 (six) hours as needed.   alendronate 70 MG tablet Commonly known as: FOSAMAX Take 1 tablet (70 mg total) by mouth every 7 (seven) days. Take with a full glass of water on an empty stomach.   cefadroxil 500 MG capsule Commonly known as: DURICEF Take 1 capsule (500 mg total) by mouth 2 (two) times daily for 5 days.   cholecalciferol 1000 units tablet Commonly known as: VITAMIN D Take 1,000 Units by mouth 2 (two) times daily.   clonazePAM 0.5 MG tablet Commonly known as: KLONOPIN TAKE 1 TABLET(0.5 MG) BY MOUTH DAILY   diclofenac 50 MG EC tablet Commonly known as: VOLTAREN Take 1 tablet (50 mg total) by mouth 2 (two) times daily between meals as needed.   gabapentin 300 MG capsule Commonly known as: Neurontin Take 1 capsule (300 mg total) by mouth 2 (two) times daily.   levothyroxine 75 MCG tablet Commonly known as: SYNTHROID TAKE 1 TABLET(75 MCG) BY MOUTH DAILY   loperamide 2 MG capsule Commonly known as: IMODIUM Take 2 capsules (4 mg total) by mouth every 6 (six) hours as needed for diarrhea or loose stools.   lovastatin 40 MG tablet Commonly known as: MEVACOR TAKE 1 TABLET(40 MG) BY MOUTH DAILY   sertraline 100 MG tablet Commonly known as: ZOLOFT TAKE 1 TABLET BY MOUTH DAILY   traMADol 50 MG tablet Commonly known as: ULTRAM Take 50 mg by mouth every 12 (twelve) hours as needed.       Allergies  Allergen Reactions   Amoxicillin-Pot Clavulanate Diarrhea and Other (See Comments)    Has patient had a PCN reaction causing immediate rash, facial/tongue/throat swelling, SOB or lightheadedness with hypotension: No Has patient had a PCN reaction causing severe rash  involving mucus membranes or skin necrosis: No Has patient had a PCN reaction that required hospitalization No Has patient had a PCN reaction occurring within the last 10 years: Yes If all of the above answers are "NO", then may proceed with Cephalosporin use.    Codeine Hives and Other (See Comments)    shakes   Latex Rash and Itching   Discharge Instructions     Call MD for:  extreme fatigue   Complete by: As directed    Call MD for:  persistant dizziness or light-headedness   Complete by: As directed    Call MD for:  persistant nausea and vomiting   Complete by: As directed    Call MD for:  severe uncontrolled pain   Complete by: As directed    Call MD for:  temperature >100.4   Complete by: As directed    Diet - low sodium heart healthy   Complete by: As directed    Discharge instructions   Complete by: As directed    F/u PCP in 1 wk   Increase activity slowly   Complete by: As directed        The results of significant  diagnostics from this hospitalization (including imaging, microbiology, ancillary and laboratory) are listed below for reference.    Significant Diagnostic Studies: US RENAL  Result Date: 06/10/2022 CLINICAL DATA:  Urinary tract infection EXAM: RENAL / URINARY TRACT ULTRASOUND COMPLETE COMPARISON:  CT chest abdomen pelvis 05/16/2022 FINDINGS: Right Kidney: Renal measurements: 12.0 x 4.6 x 5.6 cm = volume: 161 mL. Echogenicity within normal limits. No mass or hydronephrosis visualized. Left Kidney: Renal measurements: 12.3 x 5.0 x 5.5 cm = volume: 180 mL. Echogenicity within normal limits. No mass or hydronephrosis visualized. Bladder: Appears normal for degree of bladder distention. Other: Mildly enlarged spleen again noted. IMPRESSION: No significant sonographic abnormality of the kidneys. Electronically Signed   By: Acquanetta Belling M.D.   On: 06/10/2022 16:41   DG Chest 2 View  Result Date: 06/09/2022 CLINICAL DATA:  Chills EXAM: CHEST - 2 VIEW COMPARISON:   Chest radiograph dated 05/16/2022 FINDINGS: Similar asymmetric elevation of the right hemidiaphragm. No focal consolidations. No pleural effusion or pneumothorax. Similar cardiomediastinal silhouette. No acute osseous abnormality. Surgical clips project over the right axilla. IMPRESSION: No active cardiopulmonary disease. Electronically Signed   By: Agustin Cree M.D.   On: 06/09/2022 18:14   DG Hip Unilat W OR W/O Pelvis 2-3 Views Right  Result Date: 05/31/2022 CLINICAL DATA:  Chronic right hip pain. EXAM: DG HIP (WITH OR WITHOUT PELVIS) 2-3V RIGHT COMPARISON:  None Available. FINDINGS: There is no evidence of hip fracture or dislocation. Moderate to marked severity degenerative changes are seen in the form of joint space narrowing, acetabular sclerosis and lateral acetabular bony spurring. IMPRESSION: Moderate to marked severity degenerative changes. Electronically Signed   By: Aram Candela M.D.   On: 05/31/2022 23:57   CT CHEST ABDOMEN PELVIS W CONTRAST  Result Date: 05/16/2022 CLINICAL DATA:  Sepsis. Fever and abdominal pain with nausea and vomiting for the past 3 days EXAM: CT CHEST, ABDOMEN, AND PELVIS WITH CONTRAST TECHNIQUE: Multidetector CT imaging of the chest, abdomen and pelvis was performed following the standard protocol during bolus administration of intravenous contrast. RADIATION DOSE REDUCTION: This exam was performed according to the departmental dose-optimization program which includes automated exposure control, adjustment of the mA and/or kV according to patient size and/or use of iterative reconstruction technique. CONTRAST:  OMNIPAQUE IOHEXOL 300 MG/ML  SOLN COMPARISON:  09/12/2012 abdominal CT FINDINGS: CT CHEST FINDINGS Cardiovascular: Normal heart size. No pericardial effusion. 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 Atheromatous calcification of the aorta. Mediastinum/Nodes: Negative for mass or adenopathy Lungs/Pleura: Mild dependent atelectasis.  There is no edema, consolidation, effusion, or pneumothorax. Musculoskeletal: Generalized thoracic spine degeneration which is advanced with mild scoliosis. Sclerosis in line along the lateral right ribs from old fractures. CT ABDOMEN PELVIS FINDINGS Hepatobiliary: Multiple liver cysts with benign/simple appearance measuring up to 5.4 cm in the right lobe. No evidence of abscess. Small hypervascular area in segment 3 is a shunt based on the delayed phase.No evidence of biliary obstruction or stone. Pancreas: Generalized atrophy. Spleen: Mild enlargement with 5 cm thickness at this similar to before. 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 Adrenals/Urinary Tract: Negative adrenals. Patchy hypoenhancement of the right renal cortex inferiorly with asymmetric right urothelial thickening and perinephric stranding. No abscess, hydronephrosis, or calculus. Unremarkable bladder. Stomach/Bowel: No obstruction. No appendicitis. Numerous colonic diverticula. Vascular/Lymphatic: No acute vascular abnormality. No mass or adenopathy. Reproductive:Hysterectomy Other: No ascites or pneumoperitoneum. Musculoskeletal: Advanced and generalized lumbar spine degeneration with multilevel disc and facet spurring and foraminal  impingement. Scoliosis and L5-S1 anterolisthesis. IMPRESSION: Right pyelonephritis without hydronephrosis, stone, or abscess. Electronically Signed   By: Tiburcio Pea M.D.   On: 05/16/2022 04:33   DG Chest Port 1 View  Result Date: 05/16/2022 CLINICAL DATA:  Questionable sepsis. EXAM: PORTABLE CHEST 1 VIEW COMPARISON:  July 08, 2007 FINDINGS: The heart size and mediastinal contours are within normal limits. Low lung volumes are noted with moderate severity elevation of the right hemidiaphragm. Mild to moderate severity diffuse, chronic appearing increased interstitial lung markings are seen. There is no evidence of focal consolidation, pleural effusion or pneumothorax. Radiopaque  surgical clips are seen along the lateral aspect of the upper right chest wall. Multilevel degenerative changes are noted throughout the thoracic spine. IMPRESSION: Low lung volumes with chronic appearing increased interstitial lung markings. Electronically Signed   By: Aram Candela M.D.   On: 05/16/2022 03:24    Microbiology: Recent Results (from the past 240 hour(s))  Culture, blood (Routine x 2)     Status: None (Preliminary result)   Collection Time: 06/09/22  5:24 PM   Specimen: BLOOD  Result Value Ref Range Status   Specimen Description BLOOD RIGHT ANTECUBITAL  Final   Special Requests   Final    BOTTLES DRAWN AEROBIC AND ANAEROBIC Blood Culture adequate volume   Culture   Final    NO GROWTH 2 DAYS Performed at [facility], [address], [place], [place] [address]    Report Status PENDING  Incomplete  Culture, blood (Routine x 2)     Status: None (Preliminary result)   Collection Time: 06/09/22  6:17 PM   Specimen: BLOOD  Result Value Ref Range Status   Specimen Description BLOOD BLOOD LEFT ARM  Final   Special Requests   Final    BOTTLES DRAWN AEROBIC AND ANAEROBIC Blood Culture results may not be optimal due to an excessive volume of blood received in culture bottles   Culture   Final    NO GROWTH 2 DAYS Performed at [facility], [address] [place], [place] [address]    Report Status PENDING  Incomplete  Resp panel by RT-PCR (RSV, Flu A&B, Covid) Anterior Nasal Swab     Status: None   Collection Time: 06/09/22  6:17 PM   Specimen: Anterior Nasal Swab  Result Value Ref Range Status   SARS Coronavirus 2 by RT PCR NEGATIVE NEGATIVE Final    Comment: (NOTE) SARS-CoV-2 target nucleic acids are NOT DETECTED.  The SARS-CoV-2 RNA is generally detectable in upper respiratory specimens during the acute phase of infection. The lowest concentration of SARS-CoV-2 viral copies this assay can detect is 138 copies/mL. A negative result does not  preclude SARS-Cov-2 infection and should not be used as the sole basis for treatment or other patient management decisions. A negative result may occur with  improper specimen collection/handling, submission of specimen other than nasopharyngeal swab, presence of viral mutation(s) within the areas targeted by this assay, and inadequate number of viral copies(<138 copies/mL). A negative result must be combined with clinical observations, patient history, and epidemiological information. The expected result is Negative.  Fact Sheet for Patients:  BloggerCourse.com  Fact Sheet for Healthcare Providers:  SeriousBroker.it  This test is no t yet approved or cleared by the [place] FDA and  has been authorized for detection and/or diagnosis of SARS-CoV-2 by FDA under an Emergency Use Authorization (EUA). This EUA will remain  in effect (meaning this test can be used) for the duration of  the COVID-19 declaration under Section 564(b)(1) of the Act, 21 U.S.C.section 360bbb-3(b)(1), unless the authorization is terminated  or revoked sooner.       Influenza A by PCR NEGATIVE NEGATIVE Final   Influenza B by PCR NEGATIVE NEGATIVE Final    Comment: (NOTE) The Xpert Xpress SARS-CoV-2/FLU/RSV plus assay is intended as an aid in the diagnosis of influenza from Nasopharyngeal swab specimens and should not be used as a sole basis for treatment. Nasal washings and aspirates are unacceptable for Xpert Xpress SARS-CoV-2/FLU/RSV testing.  Fact Sheet for Patients: BloggerCourse.com  Fact Sheet for Healthcare Providers: SeriousBroker.it  This test is not yet approved or cleared by the [place] FDA and has been authorized for detection and/or diagnosis of SARS-CoV-2 by FDA under an Emergency Use Authorization (EUA). This EUA will remain in effect (meaning this test can be used) for the  duration of the COVID-19 declaration under Section 564(b)(1) of the Act, 21 U.S.C. section 360bbb-3(b)(1), unless the authorization is terminated or revoked.     Resp Syncytial Virus by PCR NEGATIVE NEGATIVE Final    Comment: (NOTE) Fact Sheet for Patients: BloggerCourse.com  Fact Sheet for Healthcare Providers: SeriousBroker.it  This test is not yet approved or cleared by the [place] FDA and has been authorized for detection and/or diagnosis of SARS-CoV-2 by FDA under an Emergency Use Authorization (EUA). This EUA will remain in effect (meaning this test can be used) for the duration of the COVID-19 declaration under Section 564(b)(1) of the Act, 21 U.S.C. section 360bbb-3(b)(1), unless the authorization is terminated or revoked.  Performed at [facility], [address], [place], [place] [address]   Urine Culture (for pregnant, neutropenic or urologic patients or patients with an indwelling urinary catheter)     Status: None   Collection Time: 06/09/22  7:35 PM   Specimen: Urine, Clean Catch  Result Value Ref Range Status   Specimen Description   Final    URINE, CLEAN CATCH Performed at [facility], [address], [place], [place] [address]    Special Requests   Final    NONE Performed at [facility], [address] [place], [place] [address]    Culture   Final    NO GROWTH Performed at [facility], [address] [place], [place] [address]    Report Status 06/11/2022 FINAL  Final     Labs: CBC: Recent Labs  Lab 06/09/22 1724 06/10/22 0140 06/11/22 0438  WBC 9.1 8.5 6.1  NEUTROABS 7.7  --   --   HGB 12.5 11.5* 10.8*  HCT 37.9 34.8* 32.7*  MCV 89.8 89.9 88.6  PLT 102* 86* 97*   Basic Metabolic Panel: Recent Labs  Lab 06/09/22 1724 06/10/22 0136 06/10/22 0140 06/11/22 0438  NA 134*  --  137 137  K 3.4*  --  3.2* 3.7  CL 101  --  108 108  CO2 23  --  20*  23  GLUCOSE 135*  --  139* 113*  BUN 15  --  14 9  CREATININE 0.76  --  0.68 0.56  CALCIUM 8.9  --  8.7* 8.4*  MG  --   --  1.9 2.0  PHOS  --  1.6*  --  2.4*   Liver Function Tests: Recent Labs  Lab 06/09/22 1724 06/10/22 0140  AST 16 15  ALT 13 11  ALKPHOS 66 60  BILITOT 1.0 1.1  PROT 6.9 5.9*  ALBUMIN 4.3 3.5   No  results for input(s): "LIPASE", "AMYLASE" in the last 168 hours. No results for input(s): "AMMONIA" in the last 168 hours. Cardiac Enzymes: No results for input(s): "CKTOTAL", "CKMB", "CKMBINDEX", "TROPONINI" in the last 168 hours. BNP (last 3 results) No results for input(s): "BNP" in the last 8760 hours. CBG: No results for input(s): "GLUCAP" in the last 168 hours.  Time spent: 35 minutes  Signed:  Gillis Santa  [facility] 06/11/2022 1:42 PM

## 2022-06-12 ENCOUNTER — Telehealth: Payer: Self-pay | Admitting: *Deleted

## 2022-06-12 LAB — CULTURE, BLOOD (ROUTINE X 2): Culture: NO GROWTH

## 2022-06-12 NOTE — Progress Notes (Signed)
  Care Coordination   Note   06/12/2022 Name: Jenny Roberts MRN: [id number] DOB: [birth year]  Jenny Roberts is a 77 y.o. year old female who sees Birdie Sons, Yehuda Mao, MD for primary care. I reached out to [name] by phone today to offer care coordination services.  Ms. Burgard was given information about Care Coordination services today including:   The Care Coordination services include support from the care team which includes your Nurse Coordinator, Clinical Social Worker, or Pharmacist.  The Care Coordination team is here to help remove barriers to the health concerns and goals most important to you. Care Coordination services are voluntary, and the patient may decline or stop services at any time by request to their care team member.   Care Coordination Consent Status: Patient agreed to services and verbal consent obtained.   Follow up plan:  Telephone appointment with care coordination team member scheduled for:  06/18/2022  Encounter Outcome:  Pt. Scheduled from referral   Burman Nieves, [facility] Care Coordination Care Guide Direct Dial: [phone]

## 2022-06-12 NOTE — Transitions of Care (Post Inpatient/ED Visit) (Signed)
06/12/2022  Name: Jenny Roberts MRN: [id number] DOB: Mar 20, 1945  Today's TOC FU Call Status: Today's TOC FU Call Status:: Successful TOC FU Call Competed TOC FU Call Complete Date: 06/12/22  Transition Care Management Follow-up Telephone Call Date of Discharge: 06/10/20 Discharge Facility: [facility]) Type of Discharge: Inpatient Admission Primary Inpatient Discharge Diagnosis:: Sepsis secondary to UTI Any questions or concerns?: No  Items Reviewed: Did you receive and understand the discharge instructions provided?: Yes Medications obtained,verified, and reconciled?: Yes (Medications Reviewed) Any new allergies since your discharge?: No Dietary orders reviewed?: No Do you have support at home?: Yes People in Home: spouse Name of Support/Comfort Primary Source: Faylene Million  Medications Reviewed Today: Medications Reviewed Today     Reviewed by Luella Cook, RN (Case Manager) on 06/12/22 at 1348  Med List Status: <None>   Medication Order Taking? Sig Documenting Provider Last Dose Status Informant  acetaminophen (TYLENOL) 500 MG tablet [id number] Yes Take 500 mg by mouth every 6 (six) hours as needed. [provider] Taking Active Pharmacy Records, Multiple Informants, Other  alendronate (FOSAMAX) 70 MG tablet [id number] Yes Take 1 tablet (70 mg total) by mouth every 7 (seven) days. Take with a full glass of water on an empty stomach. Glori Luis, MD Taking Active Pharmacy Records, Multiple Informants, Other  cefadroxil (DURICEF) 500 MG capsule [id number] Yes Take 1 capsule (500 mg total) by mouth 2 (two) times daily for 5 days. Gillis Santa, MD Taking Active   cholecalciferol (VITAMIN D) 1000 units tablet [id number] Yes Take 1,000 Units by mouth 2 (two) times daily. [provider] Taking Active Pharmacy Records, Multiple Informants, Other  clonazePAM (KLONOPIN) 0.5 MG tablet [id number] Yes TAKE 1 TABLET(0.5 MG) BY MOUTH DAILY  Glori Luis, MD Taking Active Pharmacy Records, Multiple Informants, Other  diclofenac (VOLTAREN) 50 MG EC tablet [id number] Yes Take 1 tablet (50 mg total) by mouth 2 (two) times daily between meals as needed. Edward Jolly, MD Taking Active Pharmacy Records, Multiple Informants, Other  gabapentin (NEURONTIN) 300 MG capsule [id number]  Take 1 capsule (300 mg total) by mouth 2 (two) times daily. Edward Jolly, MD  Expired 05/22/22 2359 Pharmacy Records, Multiple Informants, Other  levothyroxine (SYNTHROID) 75 MCG tablet [id number] Yes TAKE 1 TABLET(75 MCG) BY MOUTH DAILY Glori Luis, MD Taking Active   loperamide (IMODIUM) 2 MG capsule [id number] Yes Take 2 capsules (4 mg total) by mouth every 6 (six) hours as needed for diarrhea or loose stools. Wouk, Wilfred Curtis, MD Taking Active   lovastatin (MEVACOR) 40 MG tablet [id number] Yes TAKE 1 TABLET(40 MG) BY MOUTH DAILY Glori Luis, MD Taking Active Pharmacy Records, Multiple Informants, Other  sertraline (ZOLOFT) 100 MG tablet [id number] Yes TAKE 1 TABLET BY MOUTH DAILY Glori Luis, MD Taking Active   traMADol (ULTRAM) 50 MG tablet [id number] Yes Take 50 mg by mouth every 12 (twelve) hours as needed. [provider] Taking Active Pharmacy Records, Multiple Informants, Other  Med List Note Newman Pies, RN 10/04/21 1052): MR 02/03/21            Home Care and Equipment/Supplies: Were Home Health Services Ordered?: NA Any new equipment or medical supplies ordered?: NA  Functional Questionnaire: Do you need assistance with bathing/showering or dressing?: No Do you need assistance with meal preparation?: No Do you need assistance with eating?: No Do you have difficulty maintaining continence: No Do you need assistance with getting out of bed/getting out of a chair/moving?: No  Do you have difficulty managing or taking your medications?: No  Follow up appointments reviewed: PCP Follow-up appointment confirmed?:  Yes Date of PCP follow-up appointment?: 06/19/22 Follow-up Provider: Dr Birdie Sons 11:00 Specialist [facility] Follow-up appointment confirmed?: NA Do you need transportation to your follow-up appointment?: No Do you understand care options if your condition(s) worsen?: Yes-patient verbalized understanding  SDOH Interventions Today    Flowsheet Row Most Recent Value  SDOH Interventions   Food Insecurity Interventions Intervention Not Indicated  Housing Interventions Intervention Not Indicated  Transportation Interventions Intervention Not Indicated      Interventions Today    Flowsheet Row Most Recent Value  General Interventions   General Interventions Discussed/Reviewed General Interventions Discussed, General Interventions Reviewed, Doctor Visits, Communication with  [facility] Care Coordinator. Conflict in patient time schedule]  Doctor Visits Discussed/Reviewed Doctor Visits Discussed, Doctor Visits Reviewed  Pharmacy Interventions   Pharmacy Dicussed/Reviewed Pharmacy Topics Discussed, Pharmacy Topics Reviewed      TOC Interventions Today    Flowsheet Row Most Recent Value  TOC Interventions   TOC Interventions Discussed/Reviewed TOC Interventions Discussed, TOC Interventions Reviewed, Arranged PCP follow up within 7 days/Care Guide scheduled       Gean Maidens BSN RN [facility] Care Management [phone]

## 2022-06-14 LAB — CULTURE, BLOOD (ROUTINE X 2): Special Requests: ADEQUATE

## 2022-06-19 ENCOUNTER — Encounter: Payer: Self-pay | Admitting: Family Medicine

## 2022-06-19 ENCOUNTER — Ambulatory Visit (INDEPENDENT_AMBULATORY_CARE_PROVIDER_SITE_OTHER): Payer: Medicare Other | Admitting: Family Medicine

## 2022-06-19 VITALS — BP 140/80 | HR 71 | Temp 97.8°F | Resp 17 | Ht 63.0 in | Wt 147.4 lb

## 2022-06-19 DIAGNOSIS — A419 Sepsis, unspecified organism: Secondary | ICD-10-CM | POA: Diagnosis not present

## 2022-06-19 DIAGNOSIS — D696 Thrombocytopenia, unspecified: Secondary | ICD-10-CM | POA: Insufficient documentation

## 2022-06-19 DIAGNOSIS — N390 Urinary tract infection, site not specified: Secondary | ICD-10-CM

## 2022-06-19 LAB — POCT URINALYSIS DIPSTICK
Bilirubin, UA: NEGATIVE
Blood, UA: NEGATIVE
Glucose, UA: NEGATIVE
Ketones, UA: NEGATIVE
Nitrite, UA: NEGATIVE
Protein, UA: NEGATIVE
Spec Grav, UA: 1.015 (ref 1.010–1.025)
Urobilinogen, UA: 0.2 E.U./dL
pH, UA: 5.5 (ref 5.0–8.0)

## 2022-06-19 LAB — CBC
HCT: 40.8 % (ref 36.0–46.0)
Hemoglobin: 13.2 g/dL (ref 12.0–15.0)
MCHC: 32.3 g/dL (ref 30.0–36.0)
MCV: 90 fl (ref 78.0–100.0)
Platelets: 255 10*3/uL (ref 150.0–400.0)
RBC: 4.54 Mil/uL (ref 3.87–5.11)
RDW: 13.5 % (ref 11.5–15.5)
WBC: 5.6 10*3/uL (ref 4.0–10.5)

## 2022-06-19 NOTE — Assessment & Plan Note (Signed)
Likely related to sepsis.  She was also noted to be slightly anemic.  Recheck CBC today.

## 2022-06-19 NOTE — Patient Instructions (Signed)
Nice to see you. I have referred you to urology.  They should contact you to set up an appointment.  If you do not hear from them in the next 2 weeks please let us know. If you develop any urinary symptoms please contact us right away. We will contact you with your lab results.

## 2022-06-19 NOTE — Progress Notes (Addendum)
[name], MD Phone: [phone]  Jenny Roberts is a 77 y.o. female who presents today for f/u.  UTI/sepsis: Patient was hospitalized again for UTI with sepsis.  She was treated with broad-spectrum antibiotics initially though this was narrowed to cefadroxil at discharge.  She noted she had some fever and chills as well as urinary frequency and bad odor to her urine prior to going to the hospital.  It came on fairly suddenly.  Since discharge she has not had any dysuria, urinary frequency, urinary urgency, or hematuria.  She notes the abnormal smell to her urine is gone.  She notes no fevers.  She does note still having low energy levels though she has been hospitalized twice in the last month.  Social History   Tobacco Use  Smoking Status Never  Smokeless Tobacco Never    Current Outpatient Medications on File Prior to Visit  Medication Sig Dispense Refill   acetaminophen (TYLENOL) 500 MG tablet Take 500 mg by mouth every 6 (six) hours as needed.     alendronate (FOSAMAX) 70 MG tablet Take 1 tablet (70 mg total) by mouth every 7 (seven) days. Take with a full glass of water on an empty stomach. 4 tablet 11   cholecalciferol (VITAMIN D) 1000 units tablet Take 1,000 Units by mouth 2 (two) times daily.     diclofenac (VOLTAREN) 50 MG EC tablet Take 1 tablet (50 mg total) by mouth 2 (two) times daily between meals as needed. 30 tablet 1   levothyroxine (SYNTHROID) 75 MCG tablet TAKE 1 TABLET(75 MCG) BY MOUTH DAILY 90 tablet 1   loperamide (IMODIUM) 2 MG capsule Take 2 capsules (4 mg total) by mouth every 6 (six) hours as needed for diarrhea or loose stools. 30 capsule 0   lovastatin (MEVACOR) 40 MG tablet TAKE 1 TABLET(40 MG) BY MOUTH DAILY 90 tablet 3   sertraline (ZOLOFT) 100 MG tablet TAKE 1 TABLET BY MOUTH DAILY 90 tablet 1   traMADol (ULTRAM) 50 MG tablet Take 50 mg by mouth every 12 (twelve) hours as needed.     gabapentin (NEURONTIN) 300 MG capsule Take 1 capsule (300 mg  total) by mouth 2 (two) times daily. 60 capsule 3   No current facility-administered medications on file prior to visit.     ROS see history of present illness  Objective  Physical Exam Vitals:   06/19/22 1050 06/19/22 1114  BP: (!) 140/80 (!) 140/80  Pulse: 71   Resp: 17   Temp: 97.8 F (36.6 C)   SpO2: 97%     BP Readings from Last 3 Encounters:  06/19/22 (!) 140/80  06/11/22 102/61  05/27/22 122/72   Wt Readings from Last 3 Encounters:  06/19/22 147 lb 6 oz (66.8 kg)  06/09/22 148 lb (67.1 kg)  05/27/22 149 lb (67.6 kg)    Physical Exam Constitutional:      General: She is not in acute distress.    Appearance: She is not diaphoretic.  Cardiovascular:     Rate and Rhythm: Normal rate and regular rhythm.     Heart sounds: Normal heart sounds.  Pulmonary:     Effort: Pulmonary effort is normal.     Breath sounds: Normal breath sounds.  Abdominal:     General: Bowel sounds are normal. There is no distension.     Palpations: Abdomen is soft.     Tenderness: There is no abdominal tenderness.  Skin:    General: Skin is warm and dry.  Neurological:  Mental Status: She is alert.      Assessment/Plan: Please see individual problem list.  Sepsis secondary to UTI [facility]) Assessment & Plan: Patient presumably with a recurrent UTI leading to sepsis.  Her urine culture was negative though her urine dipstick did indicate urinary tract infection.  Has improved with antibiotics.  We will recheck a urinalysis and culture today.  We will refer to urology given recurrent UTIs leading to sepsis.  Advised to contact us immediately if she develops any urinary symptoms at all.  Discussed if she develops significant systemic symptoms she should go to the emergency department.  Orders: -     CBC -     POCT urinalysis dipstick -     Urine Culture -     Ambulatory referral to Urology  Thrombocytopenia [facility]) Assessment & Plan: Likely related to sepsis.  She was also noted to  be slightly anemic.  Recheck CBC today.  Orders: -     CBC  Recurrent UTI -     Ambulatory referral to Urology   Return for As scheduled.   [name], MD [employer] Primary [facility]

## 2022-06-19 NOTE — Assessment & Plan Note (Addendum)
Patient presumably with a recurrent UTI leading to sepsis.  Her urine culture was negative though her urine dipstick did indicate urinary tract infection.  Has improved with antibiotics.  We will recheck a urinalysis and culture today.  We will refer to urology given recurrent UTIs leading to sepsis.  Advised to contact us immediately if she develops any urinary symptoms at all.  Discussed if she develops significant systemic symptoms she should go to the emergency department.

## 2022-06-20 LAB — URINE CULTURE
MICRO NUMBER:: 15014615
SPECIMEN QUALITY:: ADEQUATE

## 2022-06-22 ENCOUNTER — Other Ambulatory Visit: Payer: Self-pay | Admitting: Family Medicine

## 2022-06-25 DIAGNOSIS — H353131 Nonexudative age-related macular degeneration, bilateral, early dry stage: Secondary | ICD-10-CM | POA: Diagnosis not present

## 2022-06-25 DIAGNOSIS — H524 Presbyopia: Secondary | ICD-10-CM | POA: Diagnosis not present

## 2022-07-01 ENCOUNTER — Ambulatory Visit: Payer: Self-pay

## 2022-07-01 NOTE — Addendum Note (Signed)
Addended by: Glori Luis on: 07/01/2022 04:52 PM   Modules accepted: Orders

## 2022-07-01 NOTE — Patient Instructions (Signed)
Visit Information  Thank you for taking time to visit with me today. Please don't hesitate to contact me if I can be of assistance to you.   Following are the goals we discussed today:   Goals Addressed             This Visit's Progress    Continued improvement post hospitalization and education on UTI's / falls       Interventions Today    Flowsheet Row Most Recent Value  Chronic Disease   Chronic disease during today's visit Other  [recurrent UTI's]  General Interventions   General Interventions Discussed/Reviewed General Interventions Discussed, Doctor Visits  [evaluation of current treatment plan for recurrent UTI's and patients adherence to plan as establised by provider.]  Doctor Visits Discussed/Reviewed Doctor Visits Discussed  [reviewed scheduled / upcoming appointments.  Inquired if patient had received call regarding urology appointment.]  Education Interventions   Education Provided Provided Education, Provided Printed Education  [education information sent to patient on fall prevention]  Provided Verbal Education On Other  [discussed UTI management:  advised to drink plenty of fluids, cranberry juice, dont hold urine, wipe front to back, keep vaginal area clean.]  Pharmacy Interventions   Pharmacy Dicussed/Reviewed Pharmacy Topics Discussed  [medications reviewed and compliance discussed. Confirmed patient completed prescribed antibiotics.]  Safety Interventions   Safety Discussed/Reviewed Fall Risk  [fall prevention discussed.]              Our next appointment is by telephone on 07/19/22 at 9:30 am  Please call the care guide team at [phone] if you need to cancel or reschedule your appointment.   If you are experiencing a Mental Health or Behavioral Health Crisis or need someone to talk to, please call the Suicide and Crisis Lifeline: 988 call 1-800-273-TALK (toll free, 24 hour hotline)  Patient verbalizes understanding of instructions and care plan  provided today and agrees to view in MyChart. Active MyChart status and patient understanding of how to access instructions and care plan via MyChart confirmed with patient.     George Ina RN,BSN,CCM [facility] Care Coordination [phone] direct line  Urinary Tract Infection, Adult A urinary tract infection (UTI) is an infection of any part of the urinary tract. The urinary tract includes: The kidneys. The ureters. The bladder. The urethra. These organs make, store, and get rid of pee (urine) in the body. What are the causes? This infection is caused by germs (bacteria) in your genital area. These germs grow and cause swelling (inflammation) of your urinary tract. What increases the risk? The following factors may make you more likely to develop this condition: Using a small, thin tube (catheter) to drain pee. Not being able to control when you pee or poop (incontinence). Being female. If you are female, these things can increase the risk: Using these methods to prevent pregnancy: A medicine that kills sperm (spermicide). A device that blocks sperm (diaphragm). Having low levels of a female hormone (estrogen). Being pregnant. You are more likely to develop this condition if: You have genes that add to your risk. You are sexually active. You take antibiotic medicines. You have trouble peeing because of: A prostate that is bigger than normal, if you are female. A blockage in the part of your body that drains pee from the bladder. A kidney stone. A nerve condition that affects your bladder. Not getting enough to drink. Not peeing often enough. You have other conditions, such as: Diabetes. A weak disease-fighting system (immune system). Sickle cell  disease. Gout. Injury of the spine. What are the signs or symptoms? Symptoms of this condition include: Needing to pee right away. Peeing small amounts often. Pain or burning when peeing. Blood in the pee. Pee that smells bad or  not like normal. Trouble peeing. Pee that is cloudy. Fluid coming from the vagina, if you are female. Pain in the belly or lower back. Other symptoms include: Vomiting. Not feeling hungry. Feeling mixed up (confused). This may be the first symptom in older adults. Being tired and grouchy (irritable). A fever. Watery poop (diarrhea). How is this treated? Taking antibiotic medicine. Taking other medicines. Drinking enough water. In some cases, you may need to see a specialist. Follow these instructions at home:  Medicines Take over-the-counter and prescription medicines only as told by your doctor. If you were prescribed an antibiotic medicine, take it as told by your doctor. Do not stop taking it even if you start to feel better. General instructions Make sure you: Pee until your bladder is empty. Do not hold pee for a long time. Empty your bladder after sex. Wipe from front to back after peeing or pooping if you are a female. Use each tissue one time when you wipe. Drink enough fluid to keep your pee pale yellow. Keep all follow-up visits. Contact a doctor if: You do not get better after 1-2 days. Your symptoms go away and then come back. Get help right away if: You have very bad back pain. You have very bad pain in your lower belly. You have a fever. You have chills. You feeling like you will vomit or you vomit. Summary A urinary tract infection (UTI) is an infection of any part of the urinary tract. This condition is caused by germs in your genital area. There are many risk factors for a UTI. Treatment includes antibiotic medicines. Drink enough fluid to keep your pee pale yellow. This information is not intended to replace advice given to you by your health care provider. Make sure you discuss any questions you have with your health care provider. Document Revised: 08/15/2019 Document Reviewed: 08/20/2019 Elsevier Patient Education  2024 [employer].  Fall  Prevention in the Home, Adult Falls can cause injuries and can happen to people of all ages. There are many things you can do to make your home safer and to help prevent falls. What actions can I take to prevent falls? General information Use good lighting in all rooms. Make sure to: Replace any light bulbs that burn out. Turn on the lights in dark areas and use night-lights. Keep items that you use often in easy-to-reach places. Lower the shelves around your home if needed. Move furniture so that there are clear paths around it. Do not use throw rugs or other things on the floor that can make you trip. If any of your floors are uneven, fix them. Add color or contrast paint or tape to clearly mark and help you see: Grab bars or handrails. First and last steps of staircases. Where the edge of each step is. If you use a ladder or stepladder: Make sure that it is fully opened. Do not climb a closed ladder. Make sure the sides of the ladder are locked in place. Have someone hold the ladder while you use it. Know where your pets are as you move through your home. What can I do in the bathroom?     Keep the floor dry. Clean up any water on the floor right away. Remove soap  buildup in the bathtub or shower. Buildup makes bathtubs and showers slippery. Use non-skid mats or decals on the floor of the bathtub or shower. Attach bath mats securely with double-sided, non-slip rug tape. If you need to sit down in the shower, use a non-slip stool. Install grab bars by the toilet and in the bathtub and shower. Do not use towel bars as grab bars. What can I do in the bedroom? Make sure that you have a light by your bed that is easy to reach. Do not use any sheets or blankets on your bed that hang to the floor. Have a firm chair or bench with side arms that you can use for support when you get dressed. What can I do in the kitchen? Clean up any spills right away. If you need to reach something  above you, use a step stool with a grab bar. Keep electrical cords out of the way. Do not use floor polish or wax that makes floors slippery. What can I do with my stairs? Do not leave anything on the stairs. Make sure that you have a light switch at the top and the bottom of the stairs. Make sure that there are handrails on both sides of the stairs. Fix handrails that are broken or loose. Install non-slip stair treads on all your stairs if they do not have carpet. Avoid having throw rugs at the top or bottom of the stairs. Choose a carpet that does not hide the edge of the steps on the stairs. Make sure that the carpet is firmly attached to the stairs. Fix carpet that is loose or worn. What can I do on the outside of my home? Use bright outdoor lighting. Fix the edges of walkways and driveways and fix any cracks. Clear paths of anything that can make you trip, such as tools or rocks. Add color or contrast paint or tape to clearly mark and help you see anything that might make you trip as you walk through a door, such as a raised step or threshold. Trim any bushes or trees on paths to your home. Check to see if handrails are loose or broken and that both sides of all steps have handrails. Install guardrails along the edges of any raised decks and porches. Have leaves, snow, or ice cleared regularly. Use sand, salt, or ice melter on paths if you live where there is ice and snow during the winter. Clean up any spills in your garage right away. This includes grease or oil spills. What other actions can I take? Review your medicines with your doctor. Some medicines can cause dizziness or changes in blood pressure, which increase your risk of falling. Wear shoes that: Have a low heel. Do not wear high heels. Have rubber bottoms and are closed at the toe. Feel good on your feet and fit well. Use tools that help you move around if needed. These include: Canes. Walkers. Scooters. Crutches. Ask  your doctor what else you can do to help prevent falls. This may include seeing a physical therapist to learn to do exercises to move better and get stronger. Where to find more information Centers for Disease Control and Prevention, [employer]: TonerPromos.no [employer] on Aging: BaseRingTones.pl National Institute on Aging: BaseRingTones.pl Contact a doctor if: You are afraid of falling at home. You feel weak, drowsy, or dizzy at home. You fall at home. Get help right away if you: Lose consciousness or have trouble moving after a fall. Have a fall  that causes a head injury. These symptoms may be an emergency. Get help right away. Call 911. Do not wait to see if the symptoms will go away. Do not drive yourself to the hospital. This information is not intended to replace advice given to you by your health care provider. Make sure you discuss any questions you have with your health care provider. Document Revised: 09/10/2021 Document Reviewed: 09/10/2021 Elsevier Patient Education  2024 [employer].

## 2022-07-01 NOTE — Patient Outreach (Signed)
  Care Coordination   Initial Visit Note   07/01/2022 Name: Jenny Roberts MRN: [id number] DOB: 1945-08-19  Jenny Roberts is a 77 y.o. year old female who sees Birdie Sons, Yehuda Mao, MD for primary care. I spoke with  [name] by phone today.  What matters to the patients health and wellness today?  Patient states she is feeling much better since having UTI.  She denies any new UTI symptoms. She reports completing her prescribed antibiotic.  Patient states she has not heard from anyone regarding urology appointment.  Patient reports fall within the last year. She states she tripped over a throw rug in her home. Denies injury.     Goals Addressed             This Visit's Progress    Continued improvement post hospitalization and education on UTI's / falls       Interventions Today    Flowsheet Row Most Recent Value  Chronic Disease   Chronic disease during today's visit Other  [recurrent UTI's]  General Interventions   General Interventions Discussed/Reviewed General Interventions Discussed, Doctor Visits  [evaluation of current treatment plan for recurrent UTI's and patients adherence to plan as establised by provider.]  Doctor Visits Discussed/Reviewed Doctor Visits Discussed  [reviewed scheduled / upcoming appointments.  Inquired if patient had received call regarding urology appointment.]  Education Interventions   Education Provided Provided Education, Provided Printed Education  [education information sent to patient on fall prevention]  Provided Verbal Education On Other  [discussed UTI management:  advised to drink plenty of fluids, cranberry juice, dont hold urine, wipe front to back, keep vaginal area clean.]  Pharmacy Interventions   Pharmacy Dicussed/Reviewed Pharmacy Topics Discussed  [medications reviewed and compliance discussed. Confirmed patient completed prescribed antibiotics.]  Safety Interventions   Safety Discussed/Reviewed Fall Risk  [fall prevention  discussed.]              SDOH assessments and interventions completed:  Yes  SDOH Interventions Today    Flowsheet Row Most Recent Value  SDOH Interventions   Food Insecurity Interventions Intervention Not Indicated  Housing Interventions Intervention Not Indicated  Transportation Interventions Intervention Not Indicated        Care Coordination Interventions:  Yes, provided   Follow up plan: Follow up call scheduled for 07/19/22    Encounter Outcome:  Pt. Visit Completed   George Ina RN,BSN,CCM [facility] Care Coordination [phone] direct line

## 2022-07-19 ENCOUNTER — Ambulatory Visit: Payer: Self-pay

## 2022-07-19 NOTE — Patient Outreach (Signed)
  Care Coordination   Follow Up Visit Note   07/19/2022 Name: Jenny Roberts MRN: [id number] DOB: [birth year]  Jenny Roberts is a 77 y.o. year old female who sees Jenny Roberts, Jenny Mao, MD for primary care. I spoke with  Jenny Roberts by phone today.  What matters to the patients health and wellness today?  Patient states she is doing very well. Denies any UTI symptoms. Reports having appointment scheduled with urologist on 07/29/22.     Goals Addressed             This Visit's Progress    Continued improvement post hospitalization and education on UTI's / falls       Interventions Today    Flowsheet Row Most Recent Value  Chronic Disease   Chronic disease during today's visit Other  [recurrent UTI]  General Interventions   General Interventions Discussed/Reviewed General Interventions Reviewed, Doctor Visits  [evaluation of current treatment plan for UTI and patients adherence to plan as established by provider.Assessed for UTI symptoms.]  Doctor Visits Discussed/Reviewed Doctor Visits Reviewed  [facility] patient has appointment to see urologist.]  Education Interventions   Education Provided Provided Education  [Advised to continue to drink plenty of water, cranberry juice, don't hold urine, keep vaginal area clean, wipe front to back and notify provider for UTI symptoms.  Reviewed urinary track/ kidney infection symptoms.]  Pharmacy Interventions   Pharmacy Dicussed/Reviewed --  [Advised to take all medications as prescribed.]  Safety Interventions   Safety Discussed/Reviewed Fall Risk  [assessed for falls.]              SDOH assessments and interventions completed:  No     Care Coordination Interventions:  Yes, provided   Follow up plan: Follow up call scheduled for 08/13/22    Encounter Outcome:  Pt. Visit Completed   George Ina RN,BSN,CCM [facility] Care Coordination [phone] direct line

## 2022-07-19 NOTE — Patient Instructions (Signed)
Visit Information  Thank you for taking time to visit with me today. Please don't hesitate to contact me if I can be of assistance to you.   Following are the goals we discussed today:   Goals Addressed             This Visit's Progress    Continued improvement post hospitalization and education on UTI's / falls       Interventions Today    Flowsheet Row Most Recent Value  Chronic Disease   Chronic disease during today's visit Other  [recurrent UTI]  General Interventions   General Interventions Discussed/Reviewed General Interventions Reviewed, Doctor Visits  [evaluation of current treatment plan for UTI and patients adherence to plan as established by provider.Assessed for UTI symptoms.]  Doctor Visits Discussed/Reviewed Doctor Visits Reviewed  [facility] patient has appointment to see urologist.]  Education Interventions   Education Provided Provided Education  [Advised to continue to drink plenty of water, cranberry juice, don't hold urine, keep vaginal area clean, wipe front to back and notify provider for UTI symptoms.  Reviewed urinary track/ kidney infection symptoms.]  Pharmacy Interventions   Pharmacy Dicussed/Reviewed --  [Advised to take all medications as prescribed.]  Safety Interventions   Safety Discussed/Reviewed Fall Risk  [assessed for falls.]              Our next appointment is by telephone on 08/13/22 at 9:30 AM  Please call the care guide team at [phone] if you need to cancel or reschedule your appointment.   If you are experiencing a Mental Health or Behavioral Health Crisis or need someone to talk to, please call the Suicide and Crisis Lifeline: 988 call 1-800-273-TALK (toll free, 24 hour hotline)  Patient verbalizes understanding of instructions and care plan provided today and agrees to view in MyChart. Active MyChart status and patient understanding of how to access instructions and care plan via MyChart confirmed with patient.     George Ina RN,BSN,CCM [facility] Care Coordination [phone] direct line

## 2022-07-23 ENCOUNTER — Other Ambulatory Visit: Payer: Self-pay | Admitting: Family Medicine

## 2022-07-27 DIAGNOSIS — Z87448 Personal history of other diseases of urinary system: Secondary | ICD-10-CM | POA: Diagnosis not present

## 2022-07-27 DIAGNOSIS — H2511 Age-related nuclear cataract, right eye: Secondary | ICD-10-CM | POA: Diagnosis not present

## 2022-07-27 DIAGNOSIS — H2513 Age-related nuclear cataract, bilateral: Secondary | ICD-10-CM | POA: Diagnosis not present

## 2022-07-27 DIAGNOSIS — H353131 Nonexudative age-related macular degeneration, bilateral, early dry stage: Secondary | ICD-10-CM | POA: Diagnosis not present

## 2022-07-27 DIAGNOSIS — H25013 Cortical age-related cataract, bilateral: Secondary | ICD-10-CM | POA: Diagnosis not present

## 2022-07-27 DIAGNOSIS — H25043 Posterior subcapsular polar age-related cataract, bilateral: Secondary | ICD-10-CM | POA: Diagnosis not present

## 2022-07-27 DIAGNOSIS — Z09 Encounter for follow-up examination after completed treatment for conditions other than malignant neoplasm: Secondary | ICD-10-CM | POA: Diagnosis not present

## 2022-07-29 ENCOUNTER — Ambulatory Visit: Payer: Medicare Other | Admitting: Urology

## 2022-07-29 ENCOUNTER — Encounter: Payer: Self-pay | Admitting: Urology

## 2022-07-29 VITALS — BP 130/70 | HR 74 | Ht 63.0 in | Wt 147.0 lb

## 2022-07-29 DIAGNOSIS — N390 Urinary tract infection, site not specified: Secondary | ICD-10-CM | POA: Diagnosis not present

## 2022-07-29 DIAGNOSIS — Z09 Encounter for follow-up examination after completed treatment for conditions other than malignant neoplasm: Secondary | ICD-10-CM

## 2022-07-29 DIAGNOSIS — Z87448 Personal history of other diseases of urinary system: Secondary | ICD-10-CM

## 2022-07-29 LAB — MICROSCOPIC EXAMINATION

## 2022-07-29 LAB — URINALYSIS, COMPLETE
Bilirubin, UA: NEGATIVE
Glucose, UA: NEGATIVE
Ketones, UA: NEGATIVE
Nitrite, UA: NEGATIVE
Protein,UA: NEGATIVE
RBC, UA: NEGATIVE
Specific Gravity, UA: 1.025 (ref 1.005–1.030)
Urobilinogen, Ur: 0.2 mg/dL (ref 0.2–1.0)
pH, UA: 5.5 (ref 5.0–7.5)

## 2022-07-29 NOTE — Progress Notes (Signed)
I, Jenny Roberts,acting as a scribe for Jenny Altes, MD.,have documented all relevant documentation on the behalf of Jenny Altes, MD,as directed by  Jenny Altes, MD while in the presence of Jenny Altes, MD.  07/29/2022 12:16 PM   Jenny Roberts 12/21/1945 [id number]  Referring provider: Glori Luis, MD [address] [place],  [place] [address]  Chief Complaint  Patient presents with   Recurrent UTI    HPI: Jenny Roberts is a 77 y.o. female referred for recurrent UTI.  She was initially admitted [facility] 05/16/22 with pyelonephritis and E. coli bacteremia. After IV antibiotic therapy, she was discharged on cefalexin 500 mg four times daily x 7 days. During the hospitalization, CT showed evidence of right pyelonephritis, but no obstruction/urinary tract calculi.  Readmitted 06/09/22 for recurrent fever and urinalysis with pyuria and nitrite positive. Though urine and blood cultures were a negative. She was discharged on  cefadroxil 500 mg twice daily for 5 days. Renal ultrasound performed during that hospitalization was unremarkable. Since her last hospital discharge, she has done well and currently having no symptoms.  No history of recurrent UTIs prior to her recent hospitalizations.    PMH: Past Medical History:  Diagnosis Date   Anxiety    Breast cancer (HCC) 2001   Right. Treated with lumpectomy followed by Chemotherapy and Radiation therapy   Depression    Hyperlipidemia    Nontraumatic rupture of tendon of thumb 06/15/2014   Personal history of chemotherapy    Personal history of radiation therapy    Right forearm fracture 05/22/2020   Skin cancer 09/2004   treated at [facility]   Thyroid disease     Surgical History: Past Surgical History:  Procedure Laterality Date   ABDOMINAL HYSTERECTOMY  1983   Left parts of each ovary    BREAST BIOPSY Right 2001   positive   BREAST EXCISIONAL BIOPSY Right 1990's   benign   BREAST LUMPECTOMY  Right 08/16/1999   Metaplastic carcinoma, grade 3; 1.7 cm, T1c, N0; 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  Focal positive posterior margin.  0/5 lymph nodes.   CARPAL TUNNEL RELEASE  2016   COLONOSCOPY  2008   COLONOSCOPY WITH PROPOFOL N/A 02/16/2018   Procedure: COLONOSCOPY WITH PROPOFOL;  Surgeon: Jenny Mood, MD;  Location: [facility] ENDOSCOPY;  Service: Endoscopy;  Laterality: N/A;   KNEE SURGERY  2003   mole excision  2006   Basal cell    WRIST SURGERY Right 2010    Home Medications:  Allergies as of 07/29/2022       Reactions   Amoxicillin-pot Clavulanate Diarrhea, Other (See Comments)   Has patient had a PCN reaction causing immediate rash, facial/tongue/throat swelling, SOB or lightheadedness with hypotension: No Has patient had a PCN reaction causing severe rash involving mucus membranes or skin necrosis: No Has patient had a PCN reaction that required hospitalization No Has patient had a PCN reaction occurring within the last 10 years: Yes If all of the above answers are "NO", then may proceed with Cephalosporin use.   Codeine Hives, Other (See Comments)   shakes   Latex Rash, Itching        Medication List        Accurate as of July 29, 2022 12:16 PM. If you have any questions, ask your nurse or doctor.          acetaminophen 500 MG tablet Commonly known as: TYLENOL Take 500 mg by mouth  every 6 (six) hours as needed.   alendronate 70 MG tablet Commonly known as: FOSAMAX Take 1 tablet (70 mg total) by mouth every 7 (seven) days. Take with a full glass of water on an empty stomach.   cholecalciferol 1000 units tablet Commonly known as: VITAMIN D Take 1,000 Units by mouth 2 (two) times daily.   clonazePAM 0.5 MG tablet Commonly known as: KLONOPIN TAKE 1 TABLET(0.5 MG) BY MOUTH DAILY   diclofenac 50 MG EC tablet Commonly known as: VOLTAREN Take 1 tablet (50 mg total) by mouth 2 (two) times daily between meals as needed.   gabapentin 300 MG  capsule Commonly known as: Neurontin Take 1 capsule (300 mg total) by mouth 2 (two) times daily.   levothyroxine 75 MCG tablet Commonly known as: SYNTHROID TAKE 1 TABLET(75 MCG) BY MOUTH DAILY   loperamide 2 MG capsule Commonly known as: IMODIUM Take 2 capsules (4 mg total) by mouth every 6 (six) hours as needed for diarrhea or loose stools.   lovastatin 40 MG tablet Commonly known as: MEVACOR TAKE 1 TABLET(40 MG) BY MOUTH DAILY   sertraline 100 MG tablet Commonly known as: ZOLOFT TAKE 1 TABLET BY MOUTH DAILY   traMADol 50 MG tablet Commonly known as: ULTRAM Take 50 mg by mouth every 12 (twelve) hours as needed.        Allergies:  Allergies  Allergen Reactions   Amoxicillin-Pot Clavulanate Diarrhea and Other (See Comments)    Has patient had a PCN reaction causing immediate rash, facial/tongue/throat swelling, SOB or lightheadedness with hypotension: No Has patient had a PCN reaction causing severe rash involving mucus membranes or skin necrosis: No Has patient had a PCN reaction that required hospitalization No Has patient had a PCN reaction occurring within the last 10 years: Yes If all of the above answers are "NO", then may proceed with Cephalosporin use.    Codeine Hives and Other (See Comments)    shakes   Latex Rash and Itching    Family History: Family History  Problem Relation Age of Onset   Brain cancer Father    Cancer Maternal Grandfather 31       Colon Cancer   Breast cancer Neg Hx     Social History:  reports that she has never smoked. She has never used smokeless tobacco. She reports that she does not drink alcohol and does not use drugs.   Physical Exam: BP 130/70   Pulse 74   Ht 5\' 3"  (1.6 m)   Wt 147 lb (66.7 kg)   BMI 26.04 kg/m   Constitutional:  Alert and oriented, No acute distress. HEENT: [place] AT Respiratory: Normal respiratory effort, no increased work of breathing. Psychiatric: Normal Roberts and affect.   Urinalysis Dipstick  trace leukocytes, microscopy 6-10 WBC.   Pertinent Imaging: CT was personally reviewed and interpreted.   CT ABDOMEN AND PELVIS  EXAM: CT CHEST, ABDOMEN, AND PELVIS WITH CONTRAST   TECHNIQUE: Multidetector CT imaging of the chest, abdomen and pelvis was performed following the standard protocol during bolus administration of intravenous contrast.   RADIATION DOSE REDUCTION: This exam was performed according to the departmental dose-optimization program which includes automated exposure control, adjustment of the mA and/or kV according to patient size and/or use of iterative reconstruction technique.   CONTRAST:  OMNIPAQUE IOHEXOL 300 MG/ML  SOLN   COMPARISON:  09/12/2012 abdominal CT   FINDINGS: CT CHEST FINDINGS   Cardiovascular: Normal heart size. No pericardial effusion. 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 Atheromatous calcification of the aorta.   Mediastinum/Nodes: Negative for mass or adenopathy   Lungs/Pleura: Mild dependent atelectasis. There is no edema, consolidation, effusion, or pneumothorax.   Musculoskeletal: Generalized thoracic spine degeneration which is advanced with mild scoliosis. Sclerosis in line along the lateral right ribs from old fractures.   CT ABDOMEN PELVIS FINDINGS   Hepatobiliary: Multiple liver cysts with benign/simple appearance measuring up to 5.4 cm in the right lobe. No evidence of abscess. Small hypervascular area in segment 3 is a shunt based on the delayed phase.No evidence of biliary obstruction or stone.   Pancreas: Generalized atrophy.   Spleen: Mild enlargement with 5 cm thickness at this similar to before. 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   Adrenals/Urinary Tract: Negative adrenals. Patchy hypoenhancement of the right renal cortex inferiorly with asymmetric right urothelial thickening and perinephric stranding. No abscess, hydronephrosis,  or calculus. Unremarkable bladder.   Stomach/Bowel: No obstruction. No appendicitis. Numerous colonic diverticula.   Vascular/Lymphatic: No acute vascular abnormality. No mass or adenopathy.   Reproductive:Hysterectomy   Other: No ascites or pneumoperitoneum.   Musculoskeletal: Advanced and generalized lumbar spine degeneration with multilevel disc and facet spurring and foraminal impingement. Scoliosis and L5-S1 anterolisthesis.   IMPRESSION: Right pyelonephritis without hydronephrosis, stone, or abscess.     Electronically Signed   By: Tiburcio Pea M.D.   On: 05/16/2022 04:33   Assessment & Plan:    1. History of pyelonephritis Initially she had positive blood cultures and was only treated with a 1 week course of Keflex.  Clinical picture consistent with a persistent and recurrent urinary tract infection.  Presently asymptomatic; UA with mild pyuria and urine culture was repeated. Follow-up PRN.  I have reviewed the above documentation for accuracy and completeness, and I agree with the above.   Jenny Altes, MD  [facility] [address] [place], [place] [address] [phone]

## 2022-08-02 LAB — CULTURE, URINE COMPREHENSIVE

## 2022-08-13 ENCOUNTER — Ambulatory Visit: Payer: Self-pay

## 2022-08-13 NOTE — Patient Outreach (Signed)
  Care Coordination   Follow Up Visit Note   08/13/2022 Name: LATRICA CLOWERS MRN: [id number] DOB: February 27, 1945  TAMIYAH MOULIN is a 77 y.o. year old female who sees Birdie Sons, Yehuda Mao, MD for primary care. I spoke with  [name] by phone today.  What matters to the patients health and wellness today?  Patient states she is doing very well. She denies any UTI symptoms. Patient reports having visit with urologist. She states urologist felt she was doing well and didn't find any physical reason for UTI.  She states she is to follow up with urologist as needed.     Goals Addressed             This Visit's Progress    Continued improvement post hospitalization and education on UTI's / falls       Interventions Today    Flowsheet Row Most Recent Value  Chronic Disease   Chronic disease during today's visit Diabetes  General Interventions   General Interventions Discussed/Reviewed General Interventions Reviewed, Doctor Visits  [evalution of current treatment plan for UTI's and patients adherence to plan as established by provider.  Assessed for UTI symptoms. Patient agreeable to additional outreach with RNCM.]  Doctor Visits Discussed/Reviewed Doctor Visits Reviewed  [Discussed recent urology visit.  Reviewed scheduled/ upcomig provider visits.]  Education Interventions   Education Provided Provided Education  [encouraged patient to continue to drink plenty of water, don't hold urine for long periods of time, keep perineal area clean, consider cranberry juice. Advised to notify provider immediately for UTI symptoms.]  Pharmacy Interventions   Pharmacy Dicussed/Reviewed Pharmacy Topics Reviewed  [medications addressed for additions/ changes.]              SDOH assessments and interventions completed:  No     Care Coordination Interventions:  Yes, provided   Follow up plan: Follow up call scheduled for 09/17/22    Encounter Outcome:  Pt. Visit Completed   George Ina  RN,BSN,CCM [facility] Care Coordination [phone] direct line

## 2022-08-13 NOTE — Patient Instructions (Signed)
Visit Information  Thank you for taking time to visit with me today. Please don't hesitate to contact me if I can be of assistance to you.   Following are the goals we discussed today:   Goals Addressed             This Visit's Progress    Continued improvement post hospitalization and education on UTI's / falls       Interventions Today    Flowsheet Row Most Recent Value  Chronic Disease   Chronic disease during today's visit Diabetes  General Interventions   General Interventions Discussed/Reviewed General Interventions Reviewed, Doctor Visits  [evalution of current treatment plan for UTI's and patients adherence to plan as established by provider.  Assessed for UTI symptoms. Patient agreeable to additional outreach with RNCM.]  Doctor Visits Discussed/Reviewed Doctor Visits Reviewed  [Discussed recent urology visit.  Reviewed scheduled/ upcomig provider visits.]  Education Interventions   Education Provided Provided Education  [encouraged patient to continue to drink plenty of water, don't hold urine for long periods of time, keep perineal area clean, consider cranberry juice. Advised to notify provider immediately for UTI symptoms.]  Pharmacy Interventions   Pharmacy Dicussed/Reviewed Pharmacy Topics Reviewed  [medications addressed for additions/ changes.]              Our next appointment is by telephone on 09/17/22 at 10 am  Please call the care guide team at [phone] if you need to cancel or reschedule your appointment.   If you are experiencing a Mental Health or Behavioral Health Crisis or need someone to talk to, please call the Suicide and Crisis Lifeline: 988 call 1-800-273-TALK (toll free, 24 hour hotline)  Patient verbalizes understanding of instructions and care plan provided today and agrees to view in MyChart. Active MyChart status and patient understanding of how to access instructions and care plan via MyChart confirmed with patient.     Jenny Ina  RN,BSN,CCM [facility] Care Coordination [phone] direct line

## 2022-08-21 ENCOUNTER — Encounter (INDEPENDENT_AMBULATORY_CARE_PROVIDER_SITE_OTHER): Payer: Self-pay

## 2022-08-24 ENCOUNTER — Other Ambulatory Visit: Payer: Self-pay | Admitting: Family Medicine

## 2022-08-24 DIAGNOSIS — E039 Hypothyroidism, unspecified: Secondary | ICD-10-CM

## 2022-08-25 ENCOUNTER — Telehealth: Payer: Self-pay | Admitting: Family Medicine

## 2022-08-26 NOTE — Telephone Encounter (Signed)
Prescription Request  08/26/2022  LOV: 06/19/2022  What is the name of the medication or equipment? clonazePAM (KLONOPIN) 0.5 MG tablet  Have you contacted your pharmacy to request a refill? Yes   Which pharmacy would you like this sent to?   [id number] - [place], [place] - [address] AT [facility] [address] [facility] [address] [address] [place] [address] Phone: [phone] Fax: [phone]    Patient notified that their request is being sent to the clinical staff for review and that they should receive a response within 2 business days.   Please advise at Mobile [phone] (mobile)

## 2022-08-27 NOTE — Telephone Encounter (Signed)
Pt called in to check status of previous refill.

## 2022-08-29 NOTE — Telephone Encounter (Signed)
Prescription sent to the Pharmacy.

## 2022-09-03 ENCOUNTER — Encounter: Payer: Self-pay | Admitting: Family Medicine

## 2022-09-03 ENCOUNTER — Ambulatory Visit (INDEPENDENT_AMBULATORY_CARE_PROVIDER_SITE_OTHER): Payer: Medicare Other | Admitting: Family Medicine

## 2022-09-03 VITALS — BP 120/70 | HR 71 | Temp 97.6°F | Ht 63.0 in | Wt 146.0 lb

## 2022-09-03 DIAGNOSIS — M810 Age-related osteoporosis without current pathological fracture: Secondary | ICD-10-CM

## 2022-09-03 DIAGNOSIS — E785 Hyperlipidemia, unspecified: Secondary | ICD-10-CM | POA: Diagnosis not present

## 2022-09-03 DIAGNOSIS — E039 Hypothyroidism, unspecified: Secondary | ICD-10-CM

## 2022-09-03 DIAGNOSIS — F411 Generalized anxiety disorder: Secondary | ICD-10-CM | POA: Diagnosis not present

## 2022-09-03 DIAGNOSIS — M25551 Pain in right hip: Secondary | ICD-10-CM

## 2022-09-03 DIAGNOSIS — M5441 Lumbago with sciatica, right side: Secondary | ICD-10-CM

## 2022-09-03 DIAGNOSIS — G8929 Other chronic pain: Secondary | ICD-10-CM

## 2022-09-03 DIAGNOSIS — R7309 Other abnormal glucose: Secondary | ICD-10-CM

## 2022-09-03 DIAGNOSIS — R195 Other fecal abnormalities: Secondary | ICD-10-CM

## 2022-09-03 LAB — HEMOGLOBIN A1C: Hgb A1c MFr Bld: 6 % (ref 4.6–6.5)

## 2022-09-03 LAB — BASIC METABOLIC PANEL
BUN: 20 mg/dL (ref 6–23)
CO2: 28 mEq/L (ref 19–32)
Calcium: 10 mg/dL (ref 8.4–10.5)
Chloride: 103 mEq/L (ref 96–112)
Creatinine, Ser: 0.79 mg/dL (ref 0.40–1.20)
GFR: 72.16 mL/min (ref >60.00)
Glucose, Bld: 101 mg/dL — ABNORMAL HIGH (ref 70–99)
Potassium: 4.5 mEq/L (ref 3.5–5.1)
Sodium: 139 mEq/L (ref 135–145)

## 2022-09-03 LAB — LIPID PANEL
Cholesterol: 154 mg/dL (ref 0–200)
HDL: 49.5 mg/dL (ref >39.00)
LDL Cholesterol: 87 mg/dL (ref 0–99)
NonHDL: 104.18
Total CHOL/HDL Ratio: 3
Triglycerides: 86 mg/dL (ref 0.0–149.0)
VLDL: 17.2 mg/dL (ref 0.0–40.0)

## 2022-09-03 LAB — TSH: TSH: 8.4 u[IU]/mL — ABNORMAL HIGH (ref 0.35–5.50)

## 2022-09-03 MED ORDER — ALENDRONATE SODIUM 70 MG PO TABS: 70.0000 mg | ORAL_TABLET | ORAL | 3 refills | Status: DC

## 2022-09-03 NOTE — Progress Notes (Signed)
[name], MD Phone: [phone]  Jenny Roberts is a 77 y.o. female who presents today for follow-up.  Anxiety: Patient notes this is stable.  She uses the clonazepam to help with anxiety and sleep at night.  No drowsiness the next day.  She notes no depression or SI.  Hyperlipidemia: Taking lovastatin.  No chest pain, claudication, right upper quadrant pain, or myalgias.  Chronic hip pain and leg pain: Patient notes continued right hip and leg pain after prior hip surgery.  She notes she needs to see orthopedics.  Back pain: Patient notes the 2 injection she got in her back helped with the pain.  She does take gabapentin for this.  She wonders if there is any risk to taking this.  Social History   Tobacco Use  Smoking Status Never  Smokeless Tobacco Never    Current Outpatient Medications on File Prior to Visit  Medication Sig Dispense Refill   acetaminophen (TYLENOL) 500 MG tablet Take 500 mg by mouth every 6 (six) hours as needed.     cholecalciferol (VITAMIN D) 1000 units tablet Take 1,000 Units by mouth 2 (two) times daily.     clonazePAM (KLONOPIN) 0.5 MG tablet TAKE 1 TABLET(0.5 MG) BY MOUTH DAILY 30 tablet 0   diclofenac (VOLTAREN) 50 MG EC tablet Take 1 tablet (50 mg total) by mouth 2 (two) times daily between meals as needed. 30 tablet 1   levothyroxine (SYNTHROID) 75 MCG tablet TAKE 1 TABLET(75 MCG) BY MOUTH DAILY 90 tablet 1   loperamide (IMODIUM) 2 MG capsule Take 2 capsules (4 mg total) by mouth every 6 (six) hours as needed for diarrhea or loose stools. 30 capsule 0   lovastatin (MEVACOR) 40 MG tablet TAKE 1 TABLET(40 MG) BY MOUTH DAILY 90 tablet 3   sertraline (ZOLOFT) 100 MG tablet TAKE 1 TABLET BY MOUTH DAILY 90 tablet 1   traMADol (ULTRAM) 50 MG tablet Take 50 mg by mouth every 12 (twelve) hours as needed.     gabapentin (NEURONTIN) 300 MG capsule Take 1 capsule (300 mg total) by mouth 2 (two) times daily. 60 capsule 3   No current facility-administered  medications on file prior to visit.     ROS see history of present illness  Objective  Physical Exam Vitals:   09/03/22 1035  BP: 120/70  Pulse: 71  Temp: 97.6 F (36.4 C)  SpO2: 97%    BP Readings from Last 3 Encounters:  09/03/22 120/70  07/29/22 130/70  06/19/22 (!) 140/80   Wt Readings from Last 3 Encounters:  09/03/22 146 lb (66.2 kg)  07/29/22 147 lb (66.7 kg)  06/19/22 147 lb 6 oz (66.8 kg)    Physical Exam Constitutional:      General: She is not in acute distress.    Appearance: She is not diaphoretic.  Cardiovascular:     Rate and Rhythm: Normal rate and regular rhythm.     Heart sounds: Normal heart sounds.  Pulmonary:     Effort: Pulmonary effort is normal.     Breath sounds: Normal breath sounds.  Skin:    General: Skin is warm and dry.  Neurological:     Mental Status: She is alert.      Assessment/Plan: Please see individual problem list.  Hypothyroidism, unspecified type Assessment & Plan: Chronic issue.  Recheck TSH.  She will continue Synthroid 75 mcg daily.  Orders: -     TSH  Age-related osteoporosis without current pathological fracture -  Alendronate Sodium; Take 1 tablet (70 mg total) by mouth every 7 (seven) days. Take with a full glass of water on an empty stomach.  Dispense: 12 tablet; Refill: 3  Chronic right-sided low back pain with right-sided sciatica Assessment & Plan: Chronic issue.  Improved with injections.  She will monitor.  Discussed risk of drowsiness, confusion, and vision issues with gabapentin.  If she notices those or other side effects she will let us know.   Anxiety state Assessment & Plan: Chronic issue.  Stable.  Patient can continue clonazepam 0.5 mg nightly as needed for sleep.   Hyperlipidemia, unspecified hyperlipidemia type Assessment & Plan: Chronic issue.  Check lipid panel.  Continue lovastatin 40 mg daily.  Orders: -     Lipid panel  Elevated glucose -     Hemoglobin A1c -     Basic  metabolic panel  Positive colorectal cancer screening using Cologuard test Assessment & Plan: I discussed that the patient should have follow-up colonoscopy given that her prior colonoscopy did not have a good prep.  Discussed the risk of colon cancer or having had a polyp that could turn into colon cancer.  Patient is hesitant to have colonoscopy and notes she will think about this.   Right hip pain Assessment & Plan: Chronic issue.  Patient will follow-up with orthopedics for this issue.     Return in about 3 months (around 12/04/2022).   [name], MD [facility]

## 2022-09-03 NOTE — Assessment & Plan Note (Addendum)
Chronic issue.  Improved with injections.  She will monitor.  Discussed risk of drowsiness, confusion, and vision issues with gabapentin.  If she notices those or other side effects she will let us know.

## 2022-09-03 NOTE — Assessment & Plan Note (Signed)
Chronic issue.  Check lipid panel.  Continue lovastatin 40 mg daily.

## 2022-09-03 NOTE — Assessment & Plan Note (Signed)
Chronic issue.  Patient will follow-up with orthopedics for this issue.

## 2022-09-03 NOTE — Assessment & Plan Note (Signed)
I discussed that the patient should have follow-up colonoscopy given that her prior colonoscopy did not have a good prep.  Discussed the risk of colon cancer or having had a polyp that could turn into colon cancer.  Patient is hesitant to have colonoscopy and notes she will think about this.

## 2022-09-03 NOTE — Patient Instructions (Signed)
Nice to see you. We will contact you with your lab results.  Please consider getting a colonoscopy to follow-up on your prior positive Cologuard test.

## 2022-09-03 NOTE — Assessment & Plan Note (Signed)
Chronic issue.  Recheck TSH.  She will continue Synthroid 75 mcg daily.

## 2022-09-03 NOTE — Assessment & Plan Note (Signed)
Chronic issue.  Stable.  Patient can continue clonazepam 0.5 mg nightly as needed for sleep.

## 2022-09-04 ENCOUNTER — Ambulatory Visit (INDEPENDENT_AMBULATORY_CARE_PROVIDER_SITE_OTHER): Payer: Medicare Other | Admitting: *Deleted

## 2022-09-04 VITALS — Ht 63.0 in | Wt 146.0 lb

## 2022-09-04 DIAGNOSIS — Z0000 Encounter for general adult medical examination without abnormal findings: Secondary | ICD-10-CM | POA: Diagnosis not present

## 2022-09-04 NOTE — Patient Instructions (Signed)
[name] , Thank you for taking time to come for your Medicare Wellness Visit. I appreciate your ongoing commitment to your health goals. Please review the following plan we discussed and let me know if I can assist you in the future.   Referrals/Orders/Follow-Ups/Clinician Recommendations: None  This is a list of the screening recommended for you and due dates:  Health Maintenance  Topic Date Due   Zoster (Shingles) Vaccine (1 of 2) Never done   COVID-19 Vaccine (4 - 2023-24 season) 09/21/2021   Flu Shot  08/22/2022   Mammogram  12/12/2022   Medicare Annual Wellness Visit  09/04/2023   DTaP/Tdap/Td vaccine (2 - Td or Tdap) 04/22/2030   Pneumonia Vaccine  Completed   DEXA scan (bone density measurement)  Completed   Hepatitis C Screening  Completed   HPV Vaccine  Aged Out   Colon Cancer Screening  Discontinued    Advanced directives: (Declined) Advance directive discussed with you today. Even though you declined this today, please call our office should you change your mind, and we can give you the proper paperwork for you to fill out. Patient will pick up paperwork next time she is at the office  Next Medicare Annual Wellness Visit scheduled for next year: Yes 09/08/23 @ 9:45  Managing Pain Without Opioids Opioids are strong medicines used to treat moderate to severe pain. For some people, especially those who have long-term (chronic) pain, opioids may not be the best choice for pain management due to: Side effects like nausea, constipation, and sleepiness. The risk of addiction (opioid use disorder). The longer you take opioids, the greater your risk of addiction. Pain that lasts for more than 3 months is called chronic pain. Managing chronic pain usually requires more than one approach and is often provided by a team of health care providers working together (multidisciplinary approach). Pain management may be done at a pain management center or pain clinic. How to manage pain  without the use of opioids Use non-opioid medicines Non-opioid medicines for pain may include: Over-the-counter or prescription non-steroidal anti-inflammatory drugs (NSAIDs). These may be the first medicines used for pain. They work well for muscle and bone pain, and they reduce swelling. Acetaminophen. This over-the-counter medicine may work well for milder pain but not swelling. Antidepressants. These may be used to treat chronic pain. A certain type of antidepressant (tricyclics) is often used. These medicines are given in lower doses for pain than when used for depression. Anticonvulsants. These are usually used to treat seizures but may also reduce nerve (neuropathic) pain. Muscle relaxants. These relieve pain caused by sudden muscle tightening (spasms). You may also use a pain medicine that is applied to the skin as a patch, cream, or gel (topical analgesic), such as a numbing medicine. These may cause fewer side effects than medicines taken by mouth. Do certain therapies as directed Some therapies can help with pain management. They include: Physical therapy. You will do exercises to gain strength and flexibility. A physical therapist may teach you exercises to move and stretch parts of your body that are weak, stiff, or painful. You can learn these exercises at physical therapy visits and practice them at home. Physical therapy may also involve: Massage. Heat wraps or applying heat or cold to affected areas. Electrical signals that interrupt pain signals (transcutaneous electrical nerve stimulation, TENS). Weak lasers that reduce pain and swelling (low-level laser therapy). Signals from your body that help you learn to regulate pain (biofeedback). Occupational therapy. This helps you  to learn ways to function at home and work with less pain. Recreational therapy. This involves trying new activities or hobbies, such as a physical activity or drawing. Mental health therapy,  including: Cognitive behavioral therapy (CBT). This helps you learn coping skills for dealing with pain. Acceptance and commitment therapy (ACT) to change the way you think and react to pain. Relaxation therapies, including muscle relaxation exercises and mindfulness-based stress reduction. Pain management counseling. This may be individual, family, or group counseling.  Receive medical treatments Medical treatments for pain management include: Nerve block injections. These may include a pain blocker and anti-inflammatory medicines. You may have injections: Near the spine to relieve chronic back or neck pain. Into joints to relieve back or joint pain. Into nerve areas that supply a painful area to relieve body pain. Into muscles (trigger point injections) to relieve some painful muscle conditions. A medical device placed near your spine to help block pain signals and relieve nerve pain or chronic back pain (spinal cord stimulation device). Acupuncture. Follow these instructions at home Medicines Take over-the-counter and prescription medicines only as told by your health care provider. If you are taking pain medicine, ask your health care providers about possible side effects to watch out for. Do not drive or use heavy machinery while taking prescription opioid pain medicine. Lifestyle  Do not use drugs or alcohol to reduce pain. If you drink alcohol, limit how much you have to: 0-1 drink a day for women who are not pregnant. 0-2 drinks a day for men. Know how much alcohol is in a drink. In the [place] one drink equals one 12 oz bottle of beer (355 mL), one 5 oz glass of wine (148 mL), or one 1 oz glass of hard liquor (44 mL). Do not use any products that contain nicotine or tobacco. These products include cigarettes, chewing tobacco, and vaping devices, such as e-cigarettes. If you need help quitting, ask your health care provider. Eat a healthy diet and maintain a healthy weight. Poor  diet and excess weight may make pain worse. Eat foods that are high in fiber. These include fresh fruits and vegetables, whole grains, and beans. Limit foods that are high in fat and processed sugars, such as fried and sweet foods. Exercise regularly. Exercise lowers stress and may help relieve pain. Ask your health care provider what activities and exercises are safe for you. If your health care provider approves, join an exercise class that combines movement and stress reduction. Examples include yoga and tai chi. Get enough sleep. Lack of sleep may make pain worse. Lower stress as much as possible. Practice stress reduction techniques as told by your therapist. General instructions Work with all your pain management providers to find the treatments that work best for you. You are an important member of your pain management team. There are many things you can do to reduce pain on your own. Consider joining an online or in-person support group for people who have chronic pain. Keep all follow-up visits. This is important. Where to find more information You can find more information about managing pain without opioids from: American Academy of Pain Medicine: painmed.org Institute for Chronic Pain: instituteforchronicpain.org American Chronic Pain Association: theacpa.org Contact a health care provider if: You have side effects from pain medicine. Your pain gets worse or does not get better with treatments or home therapy. You are struggling with anxiety or depression. Summary Many types of pain can be managed without opioids. Chronic pain may respond better  to pain management without opioids. Pain is best managed when you and a team of health care providers work together. Pain management without opioids may include non-opioid medicines, medical treatments, physical therapy, mental health therapy, and lifestyle changes. Tell your health care providers if your pain gets worse or is not being  managed well enough. This information is not intended to replace advice given to you by your health care provider. Make sure you discuss any questions you have with your health care provider. Document Revised: 04/19/2020 Document Reviewed: 04/19/2020 Elsevier Patient Education  2024 [employer].   Preventive Care 65 Years and Older, Female Preventive care refers to lifestyle choices and visits with your health care provider that can promote health and wellness. What does preventive care include? A yearly physical exam. This is also called an annual well check. Dental exams once or twice a year. Routine eye exams. Ask your health care provider how often you should have your eyes checked. Personal lifestyle choices, including: Daily care of your teeth and gums. Regular physical activity. Eating a healthy diet. Avoiding tobacco and drug use. Limiting alcohol use. Practicing safe sex. Taking low-dose aspirin every day. Taking vitamin and mineral supplements as recommended by your health care provider. What happens during an annual well check? The services and screenings done by your health care provider during your annual well check will depend on your age, overall health, lifestyle risk factors, and family history of disease. Counseling  Your health care provider may ask you questions about your: Alcohol use. Tobacco use. Drug use. Emotional well-being. Home and relationship well-being. Sexual activity. Eating habits. History of falls. Memory and ability to understand (cognition). Work and work Astronomer. Reproductive health. Screening  You may have the following tests or measurements: Height, weight, and BMI. Blood pressure. Lipid and cholesterol levels. These may be checked every 5 years, or more frequently if you are over 11 years old. Skin check. Lung cancer screening. You may have this screening every year starting at age 39 if you have a 30-pack-year history of  smoking and currently smoke or have quit within the past 15 years. Fecal occult blood test (FOBT) of the stool. You may have this test every year starting at age 68. Flexible sigmoidoscopy or colonoscopy. You may have a sigmoidoscopy every 5 years or a colonoscopy every 10 years starting at age 62. Hepatitis C blood test. Hepatitis B blood test. Sexually transmitted disease (STD) testing. Diabetes screening. This is done by checking your blood sugar (glucose) after you have not eaten for a while (fasting). You may have this done every 1-3 years. Bone density scan. This is done to screen for osteoporosis. You may have this done starting at age 51. Mammogram. This may be done every 1-2 years. Talk to your health care provider about how often you should have regular mammograms. Talk with your health care provider about your test results, treatment options, and if necessary, the need for more tests. Vaccines  Your health care provider may recommend certain vaccines, such as: Influenza vaccine. This is recommended every year. Tetanus, diphtheria, and acellular pertussis (Tdap, Td) vaccine. You may need a Td booster every 10 years. Zoster vaccine. You may need this after age 28. Pneumococcal 13-valent conjugate (PCV13) vaccine. One dose is recommended after age 34. Pneumococcal polysaccharide (PPSV23) vaccine. One dose is recommended after age 31. Talk to your health care provider about which screenings and vaccines you need and how often you need them. This information is not intended  to replace advice given to you by your health care provider. Make sure you discuss any questions you have with your health care provider. Document Released: 02/03/2015 Document Revised: 09/27/2015 Document Reviewed: 11/08/2014 Elsevier Interactive Patient Education  2017 [employer].  Fall Prevention in the Home Falls can cause injuries. They can happen to people of all ages. There are many things you can do to make  your home safe and to help prevent falls. What can I do on the outside of my home? Regularly fix the edges of walkways and driveways and fix any cracks. Remove anything that might make you trip as you walk through a door, such as a raised step or threshold. Trim any bushes or trees on the path to your home. Use bright outdoor lighting. Clear any walking paths of anything that might make someone trip, such as rocks or tools. Regularly check to see if handrails are loose or broken. Make sure that both sides of any steps have handrails. Any raised decks and porches should have guardrails on the edges. Have any leaves, snow, or ice cleared regularly. Use sand or salt on walking paths during winter. Clean up any spills in your garage right away. This includes oil or grease spills. What can I do in the bathroom? Use night lights. Install grab bars by the toilet and in the tub and shower. Do not use towel bars as grab bars. Use non-skid mats or decals in the tub or shower. If you need to sit down in the shower, use a plastic, non-slip stool. Keep the floor dry. Clean up any water that spills on the floor as soon as it happens. Remove soap buildup in the tub or shower regularly. Attach bath mats securely with double-sided non-slip rug tape. Do not have throw rugs and other things on the floor that can make you trip. What can I do in the bedroom? Use night lights. Make sure that you have a light by your bed that is easy to reach. Do not use any sheets or blankets that are too big for your bed. They should not hang down onto the floor. Have a firm chair that has side arms. You can use this for support while you get dressed. Do not have throw rugs and other things on the floor that can make you trip. What can I do in the kitchen? Clean up any spills right away. Avoid walking on wet floors. Keep items that you use a lot in easy-to-reach places. If you need to reach something above you, use a  strong step stool that has a grab bar. Keep electrical cords out of the way. Do not use floor polish or wax that makes floors slippery. If you must use wax, use non-skid floor wax. Do not have throw rugs and other things on the floor that can make you trip. What can I do with my stairs? Do not leave any items on the stairs. Make sure that there are handrails on both sides of the stairs and use them. Fix handrails that are broken or loose. Make sure that handrails are as long as the stairways. Check any carpeting to make sure that it is firmly attached to the stairs. Fix any carpet that is loose or worn. Avoid having throw rugs at the top or bottom of the stairs. If you do have throw rugs, attach them to the floor with carpet tape. Make sure that you have a light switch at the top of the stairs and  the bottom of the stairs. If you do not have them, ask someone to add them for you. What else can I do to help prevent falls? Wear shoes that: Do not have high heels. Have rubber bottoms. Are comfortable and fit you well. Are closed at the toe. Do not wear sandals. If you use a stepladder: Make sure that it is fully opened. Do not climb a closed stepladder. Make sure that both sides of the stepladder are locked into place. Ask someone to hold it for you, if possible. Clearly mark and make sure that you can see: Any grab bars or handrails. First and last steps. Where the edge of each step is. Use tools that help you move around (mobility aids) if they are needed. These include: Canes. Walkers. Scooters. Crutches. Turn on the lights when you go into a dark area. Replace any light bulbs as soon as they burn out. Set up your furniture so you have a clear path. Avoid moving your furniture around. If any of your floors are uneven, fix them. If there are any pets around you, be aware of where they are. Review your medicines with your doctor. Some medicines can make you feel dizzy. This can  increase your chance of falling. Ask your doctor what other things that you can do to help prevent falls. This information is not intended to replace advice given to you by your health care provider. Make sure you discuss any questions you have with your health care provider. Document Released: 11/03/2008 Document Revised: 06/15/2015 Document Reviewed: 02/11/2014 Elsevier Interactive Patient Education  2017 [employer].

## 2022-09-04 NOTE — Progress Notes (Signed)
Subjective:   Jenny Roberts is a 77 y.o. female who presents for Medicare Annual (Subsequent) preventive examination.  Visit Complete: Virtual  I connected with  Jenny Roberts on 09/04/22 by a audio enabled telemedicine application and verified that I am speaking with the correct person using two identifiers.  Patient Location: Home  Provider Location: Office/Clinic  I discussed the limitations of evaluation and management by telemedicine. The patient expressed understanding and agreed to proceed.  Vital Signs: Unable to obtain new vitals due to this being a telehealth visit.   Review of Systems     Cardiac Risk Factors include: advanced age (>21men, >66 women);sedentary lifestyle;dyslipidemia     Objective:    Today's Vitals   09/04/22 1314  Weight: 146 lb (66.2 kg)  Height: 5\' 3"  (1.6 m)   Body mass index is 25.86 kg/m.     09/04/2022    1:29 PM 06/09/2022   10:41 PM 06/09/2022    5:22 PM 05/16/2022    8:00 AM 09/06/2021    2:09 PM 08/27/2021    9:56 AM 06/24/2021    9:08 PM  Advanced Directives  Does Patient Have a Medical Advance Directive? No No No No No No No  Would patient like information on creating a medical advance directive? No - Patient declined No - Patient declined No - Patient declined No - Patient declined  No - Patient declined     Current Medications (verified) Outpatient Encounter Medications as of 09/04/2022  Medication Sig   acetaminophen (TYLENOL) 500 MG tablet Take 500 mg by mouth every 6 (six) hours as needed.   alendronate (FOSAMAX) 70 MG tablet Take 1 tablet (70 mg total) by mouth every 7 (seven) days. Take with a full glass of water on an empty stomach.   cholecalciferol (VITAMIN D) 1000 units tablet Take 1,000 Units by mouth 2 (two) times daily.   clonazePAM (KLONOPIN) 0.5 MG tablet TAKE 1 TABLET(0.5 MG) BY MOUTH DAILY   diclofenac (VOLTAREN) 50 MG EC tablet Take 1 tablet (50 mg total) by mouth 2 (two) times daily between meals as needed.    gabapentin (NEURONTIN) 300 MG capsule Take 1 capsule (300 mg total) by mouth 2 (two) times daily.   levothyroxine (SYNTHROID) 75 MCG tablet TAKE 1 TABLET(75 MCG) BY MOUTH DAILY   loperamide (IMODIUM) 2 MG capsule Take 2 capsules (4 mg total) by mouth every 6 (six) hours as needed for diarrhea or loose stools.   lovastatin (MEVACOR) 40 MG tablet TAKE 1 TABLET(40 MG) BY MOUTH DAILY   sertraline (ZOLOFT) 100 MG tablet TAKE 1 TABLET BY MOUTH DAILY   traMADol (ULTRAM) 50 MG tablet Take 50 mg by mouth every 12 (twelve) hours as needed.   No facility-administered encounter medications on file as of 09/04/2022.    Allergies (verified) Amoxicillin-pot clavulanate, Codeine, and Latex   History: Past Medical History:  Diagnosis Date   Anxiety    Breast cancer (HCC) 2001   Right. Treated with lumpectomy followed by Chemotherapy and Radiation therapy   Depression    Hyperlipidemia    Nontraumatic rupture of tendon of thumb 06/15/2014   Personal history of chemotherapy    Personal history of radiation therapy    Right forearm fracture 05/22/2020   Skin cancer 09/2004   treated at [facility]   Thyroid disease    Past Surgical History:  Procedure Laterality Date   ABDOMINAL HYSTERECTOMY  1983   Left parts of each ovary    BREAST BIOPSY Right  2001   positive   BREAST EXCISIONAL BIOPSY Right 1990's   benign   BREAST LUMPECTOMY Right 08/16/1999   Metaplastic carcinoma, grade 3; 1.7 cm, T1c, N0; 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  Focal positive posterior margin.  0/5 lymph nodes.   CARPAL TUNNEL RELEASE  2016   COLONOSCOPY  2008   COLONOSCOPY WITH PROPOFOL N/A 02/16/2018   Procedure: COLONOSCOPY WITH PROPOFOL;  Surgeon: Wyline Mood, MD;  Location[facility] ENDOSCOPY;  Service: Endoscopy;  Laterality: N/A;   KNEE SURGERY  2003   mole excision  2006   Basal cell    WRIST SURGERY Right 2010   Family History  Problem Relation Age of Onset   Brain cancer Father    Cancer Maternal Grandfather 34        Colon Cancer   Breast cancer Neg Hx    Social History   Socioeconomic History   Marital status: Married    Spouse name: Not on file   Number of children: Not on file   Years of education: Not on file   Highest education level: Not on file  Occupational History   Not on file  Tobacco Use   Smoking status: Never   Smokeless tobacco: Never  Vaping Use   Vaping status: Never Used  Substance and Sexual Activity   Alcohol use: No    Alcohol/week: 0.0 standard drinks of alcohol   Drug use: No   Sexual activity: Not Currently  Other Topics Concern   Not on file  Social History Narrative   She is a homemaker and babysits   Children- 2 daughters    8 grandchildren, 3 great grandchildren    Pets: None   Caffeine- Decaf coffee, rare Dr. Reino Kent    Enjoys- gardening, cross stitch, and quilt   Social Determinants of Health   Financial Resource Strain: Low Risk  (09/04/2022)   Overall Financial Resource Strain (CARDIA)    Difficulty of Paying Living Expenses: Not hard at all  Food Insecurity: No Food Insecurity (09/04/2022)   Hunger Vital Sign    Worried About Running Out of Food in the Last Year: Never true    Ran Out of Food in the Last Year: Never true  Transportation Needs: No Transportation Needs (09/04/2022)   PRAPARE - Administrator, Civil Service (Medical): No    Lack of Transportation (Non-Medical): No  Physical Activity: Insufficiently Active (09/04/2022)   Exercise Vital Sign    Days of Exercise per Week: 3 days    Minutes of Exercise per Session: 20 min  Stress: No Stress Concern Present (09/04/2022)   [employer] of Occupational Health - Occupational Stress Questionnaire    Feeling of Stress : Only a little  Social Connections: Socially Integrated (09/04/2022)   Social Connection and Isolation Panel [NHANES]    Frequency of Communication with Friends and Family: More than three times a week    Frequency of Social Gatherings with Friends and  Family: More than three times a week    Attends Religious Services: More than 4 times per year    Active Member of [employer] or Organizations: Yes    Attends Banker Meetings: Never    Marital Status: Married    Tobacco Counseling Counseling given: Not Answered   Clinical Intake:  Pre-visit preparation completed: Yes  Pain : No/denies pain     BMI - recorded: 25.86 Nutritional Status: BMI 25 -29 Overweight Nutritional Risks: None Diabetes: No  How often do you  need to have someone help you when you read instructions, pamphlets, or other written materials from your doctor or pharmacy?: 1 - Never  Interpreter Needed?: No  Information entered by :: R.  LPN   Activities of Daily Living    09/04/2022    1:17 PM 06/09/2022   10:41 PM  In your present state of health, do you have any difficulty performing the following activities:  Hearing? 0 0  Vision? 0 0  Comment glasses   Difficulty concentrating or making decisions? 0 0  Walking or climbing stairs? 1 1  Comment climbing steps   Dressing or bathing? 0 0  Doing errands, shopping? 0 0  Preparing Food and eating ? N   Using the Toilet? N   In the past six months, have you accidently leaked urine? N   Do you have problems with loss of bowel control? N   Managing your Medications? N   Managing your Finances? N   Housekeeping or managing your Housekeeping? N     Patient Care Team: Glori Luis, MD as PCP - General (Family Medicine) Kieth Brightly, MD (General Surgery) Otho Ket, RN as [facility] Care Management  Indicate any recent Medical Services you may have received from other than [facility] providers in the past year (date may be approximate).     Assessment:   This is a routine wellness examination for Lillianna.  Hearing/Vision screen Hearing Screening - Comments:: No issues Vision Screening - Comments:: Glasses, has cataracts  Dietary issues and exercise  activities discussed:     Goals Addressed             This Visit's Progress    Patient Stated       Wants to get out more and do more walking       Depression Screen    09/04/2022    1:22 PM 09/03/2022   10:36 AM 06/19/2022   11:28 AM 05/27/2022   11:10 AM 02/11/2022   10:11 AM 09/06/2021    2:08 PM 08/27/2021    9:55 AM  PHQ 2/9 Scores  PHQ - 2 Score 0 0 0 0 0 0 0  PHQ- 9 Score 0  3 0       Fall Risk    09/04/2022    1:19 PM 09/03/2022   10:36 AM 07/01/2022    9:36 AM 06/19/2022   10:59 AM 05/27/2022   11:09 AM  Fall Risk   Falls in the past year? 1 1 1 1 1   Number falls in past yr: 0 1 1 0 0  Comment   "tripped over rug going into dinning room."    Injury with Fall? 0 0  0 0  Risk for fall due to : History of fall(s);Impaired balance/gait History of fall(s) History of fall(s) No Fall Risks No Fall Risks  Follow up Falls evaluation completed;Falls prevention discussed Falls evaluation completed   Falls evaluation completed    MEDICARE RISK AT HOME:  Medicare Risk at Home - 09/04/22 1320     Any stairs in or around the home? Yes    If so, are there any without handrails? No    Home free of loose throw rugs in walkways, pet beds, electrical cords, etc? Yes    Adequate lighting in your home to reduce risk of falls? Yes    Life alert? No    Use of a cane, walker or w/c? No    Grab bars in the  bathroom? Yes    Shower chair or bench in shower? No    Elevated toilet seat or a handicapped toilet? No             Cognitive Function:    08/18/2017    3:22 PM 08/16/2016    3:56 PM 07/10/2015    3:48 PM  MMSE - Mini Mental State Exam  Orientation to time 5 5 5   Orientation to Place 5 5 5   Registration 3 3 3   Attention/ Calculation 5 5 5   Recall 3 3 3   Language- name 2 objects 2 2 2   Language- repeat 1 1 1   Language- follow 3 step command 3 3 3   Language- read & follow direction 1 1 1   Write a sentence 1 1 1   Copy design 1 1 1   Total score 30 30 30          09/04/2022    1:31 PM 08/24/2020   10:43 AM 08/21/2018    1:47 PM  6CIT Screen  What Year? 0 points 0 points 0 points  What month? 0 points 0 points 0 points  What time? 0 points 0 points 0 points  Count back from 20 0 points 0 points 0 points  Months in reverse 0 points 0 points 0 points  Repeat phrase 0 points 0 points 0 points  Total Score 0 points 0 points 0 points    Immunizations Immunization History  Administered Date(s) Administered   Fluad Quad(high Dose 65+) 09/22/2018, 11/23/2019, 01/09/2021, 10/10/2021   Influenza, High Dose Seasonal PF 10/06/2015, 02/28/2017, 12/26/2017   Influenza-Unspecified 11/07/2014   PFIZER(Purple Top)SARS-COV-2 Vaccination 02/27/2019, 03/20/2019, 11/01/2019   Pneumococcal Conjugate-13 07/10/2015   Pneumococcal Polysaccharide-23 08/16/2016   Tdap 04/21/2020    TDAP status: Up to date  Flu Vaccine status: Up to date  Pneumococcal vaccine status: Up to date  Covid-19 vaccine status: Completed vaccines  Qualifies for Shingles Vaccine? Yes   Zostavax completed No   Shingrix Completed?: No.    Education has been provided regarding the importance of this vaccine. Patient has been advised to call insurance company to determine out of pocket expense if they have not yet received this vaccine. Advised may also receive vaccine at local pharmacy or Health Dept. Verbalized acceptance and understanding.  Screening Tests Health Maintenance  Topic Date Due   Zoster Vaccines- Shingrix (1 of 2) Never done   COVID-19 Vaccine (4 - 2023-24 season) 09/21/2021   Medicare Annual Wellness (AWV)  08/28/2022   INFLUENZA VACCINE  08/22/2022   MAMMOGRAM  12/12/2022   DTaP/Tdap/Td (2 - Td or Tdap) 04/22/2030   Pneumonia Vaccine 44+ Years old  Completed   DEXA SCAN  Completed   Hepatitis C Screening  Completed   HPV VACCINES  Aged Out   Colonoscopy  Discontinued    Health Maintenance  Health Maintenance Due  Topic Date Due   Zoster Vaccines- Shingrix (1 of  2) Never done   COVID-19 Vaccine (4 - 2023-24 season) 09/21/2021   Medicare Annual Wellness (AWV)  08/28/2022   INFLUENZA VACCINE  08/22/2022    Colorectal cancer screening: Type of screening: Colonoscopy. Completed 1/20. Repeat every 10 years Patient undecided if she will have any more  Mammogram status: Completed 11/23. Repeat every year  Bone Density status: Completed 5/22. Results reflect: Bone density results: OSTEOPOROSIS. Repeat every 2 years.Patient wants to discuss with her orthopedist   Lung Cancer Screening: (Low Dose CT Chest recommended if Age 65-80 years, 20 pack-year currently smoking OR  have quit w/in 15years.) does not qualify.    Additional Screening:  Hepatitis C Screening: does qualify; Completed 7/18  Vision Screening: Recommended annual ophthalmology exams for early detection of glaucoma and other disorders of the eye. Is the patient up to date with their annual eye exam?  Yes  Who is the provider or what is the name of the office in which the patient attends annual eye exams? [facility] If pt is not established with a provider, would they like to be referred to a provider to establish care? No .   Dental Screening: Recommended annual dental exams for proper oral hygiene   Community Resource Referral / Chronic Care Management: CRR required this visit?  No   CCM required this visit?  No     Plan:     I have personally reviewed and noted the following in the patient's chart:   Medical and social history Use of alcohol, tobacco or illicit drugs  Current medications and supplements including opioid prescriptions. Patient is currently taking opioid prescriptions. Information provided to patient regarding non-opioid alternatives. Patient advised to discuss non-opioid treatment plan with their provider. Functional ability and status Nutritional status Physical activity Advanced directives List of other physicians Hospitalizations, surgeries, and ER  visits in previous 12 months Vitals Screenings to include cognitive, depression, and falls Referrals and appointments  In addition, I have reviewed and discussed with patient certain preventive protocols, quality metrics, and best practice recommendations. A written personalized care plan for preventive services as well as general preventive health recommendations were provided to patient.     Sydell Axon, LPN   1/61/0960   After Visit Summary: (MyChart) Due to this being a telephonic visit, the after visit summary with patients personalized plan was offered to patient via MyChart   Nurse Notes: None

## 2022-09-06 ENCOUNTER — Other Ambulatory Visit: Payer: Self-pay

## 2022-09-06 DIAGNOSIS — E039 Hypothyroidism, unspecified: Secondary | ICD-10-CM

## 2022-09-06 MED ORDER — LEVOTHYROXINE SODIUM 88 MCG PO TABS
88.0000 ug | ORAL_TABLET | Freq: Every day | ORAL | 3 refills | Status: DC
Start: 2022-09-06 — End: 2023-06-11

## 2022-09-17 ENCOUNTER — Ambulatory Visit: Payer: Self-pay

## 2022-09-17 NOTE — Patient Outreach (Signed)
  Care Coordination   Follow Up Visit Note   09/17/2022 Name: Jenny Roberts MRN: [id number] DOB: 03/25/1945  [name] is a 77 y.o. year old female who sees Birdie Sons, Yehuda Mao, MD for primary care. I spoke with  [name] by phone today.  What matters to the patients health and wellness today?  Patient states she is doing well despite having right hip/ leg pain. She states this has been ongoing.  Patient states pain is off and on when walking and hurts worse when driving.  Patient reports having follow up visit with primary care provider on 09/03/22.  She states she was advised to contact her orthopedic doctor for further assessment.   Patient denies having any additional UTI symptoms.   Patient states her primary provider adjusted her levothyroxine due to recent lab work for her thyroid.  Per chart review she is scheduled to have repeat labs in 09/2022 and follow up with primary provider 11/2022.     Goals Addressed             This Visit's Progress    Continued improvement post hospitalization and education on UTI's / falls       Interventions Today    Flowsheet Row Most Recent Value  Chronic Disease   Chronic disease during today's visit Other  [UTI's, chronic right hip and leg pain]  General Interventions   General Interventions Discussed/Reviewed General Interventions Reviewed, Doctor Visits  [evaluation of current treatment plan and patients adherence to plan as established by provider.  Assessed for ongoing UTI symptoms and pain level.]  Doctor Visits Discussed/Reviewed Doctor Visits Reviewed  Annabell Sabal upcoming provider visits. Advised to keep follow up visit with provider and notify of any worsening/ ongoing symptoms. Advised to contact orthopedic office and scheduled visit for right hip/ leg pain.]  Pharmacy Interventions   Pharmacy Dicussed/Reviewed Pharmacy Topics Reviewed  [medications discussed.  Discussed importance of compliance with medications.  Assessed  patients medication regimen for pain management for right hip/ leg.]              SDOH assessments and interventions completed:  No     Care Coordination Interventions:  Yes, provided   Follow up plan: Follow up call scheduled for 10/23/22    Encounter Outcome:  Pt. Visit Completed   George Ina RN,BSN,CCM [facility] Care Coordination [phone] direct line

## 2022-09-17 NOTE — Patient Instructions (Signed)
Visit Information  Thank you for taking time to visit with me today. Please don't hesitate to contact me if I can be of assistance to you.   Following are the goals we discussed today:   Goals Addressed             This Visit's Progress    Continued improvement post hospitalization and education on UTI's / falls       Interventions Today    Flowsheet Row Most Recent Value  Chronic Disease   Chronic disease during today's visit Other  [UTI's, chronic right hip and leg pain]  General Interventions   General Interventions Discussed/Reviewed General Interventions Reviewed, Doctor Visits  [evaluation of current treatment plan and patients adherence to plan as established by provider.  Assessed for ongoing UTI symptoms and pain level.]  Doctor Visits Discussed/Reviewed Doctor Visits Reviewed  Annabell Sabal upcoming provider visits. Advised to keep follow up visit with provider and notify of any worsening/ ongoing symptoms. Advised to contact orthopedic office and scheduled visit for right hip/ leg pain.]  Pharmacy Interventions   Pharmacy Dicussed/Reviewed Pharmacy Topics Reviewed  [medications discussed.  Discussed importance of compliance with medications.  Assessed patients medication regimen for pain management for right hip/ leg.]              Our next appointment is by telephone on 10/23/22 at 10 am  Please call the care guide team at [phone] if you need to cancel or reschedule your appointment.   If you are experiencing a Mental Health or Behavioral Health Crisis or need someone to talk to, please call the Suicide and Crisis Lifeline: 988 call 1-800-273-TALK (toll free, 24 hour hotline)  Patient verbalizes understanding of instructions and care plan provided today and agrees to view in MyChart. Active MyChart status and patient understanding of how to access instructions and care plan via MyChart confirmed with patient.     George Ina RN,BSN,CCM [facility] Care  Coordination [phone] direct line

## 2022-09-19 DIAGNOSIS — H25013 Cortical age-related cataract, bilateral: Secondary | ICD-10-CM | POA: Diagnosis not present

## 2022-09-19 DIAGNOSIS — H2513 Age-related nuclear cataract, bilateral: Secondary | ICD-10-CM | POA: Diagnosis not present

## 2022-09-19 DIAGNOSIS — H353131 Nonexudative age-related macular degeneration, bilateral, early dry stage: Secondary | ICD-10-CM | POA: Diagnosis not present

## 2022-09-19 DIAGNOSIS — H2511 Age-related nuclear cataract, right eye: Secondary | ICD-10-CM | POA: Diagnosis not present

## 2022-09-19 DIAGNOSIS — H25043 Posterior subcapsular polar age-related cataract, bilateral: Secondary | ICD-10-CM | POA: Diagnosis not present

## 2022-09-26 ENCOUNTER — Other Ambulatory Visit: Payer: Self-pay | Admitting: Family Medicine

## 2022-09-27 DIAGNOSIS — H2511 Age-related nuclear cataract, right eye: Secondary | ICD-10-CM | POA: Diagnosis not present

## 2022-09-27 DIAGNOSIS — H25012 Cortical age-related cataract, left eye: Secondary | ICD-10-CM | POA: Diagnosis not present

## 2022-09-27 DIAGNOSIS — H2512 Age-related nuclear cataract, left eye: Secondary | ICD-10-CM | POA: Diagnosis not present

## 2022-09-27 DIAGNOSIS — H25042 Posterior subcapsular polar age-related cataract, left eye: Secondary | ICD-10-CM | POA: Diagnosis not present

## 2022-10-11 DIAGNOSIS — H25012 Cortical age-related cataract, left eye: Secondary | ICD-10-CM | POA: Diagnosis not present

## 2022-10-11 DIAGNOSIS — H2512 Age-related nuclear cataract, left eye: Secondary | ICD-10-CM | POA: Diagnosis not present

## 2022-10-11 DIAGNOSIS — H25042 Posterior subcapsular polar age-related cataract, left eye: Secondary | ICD-10-CM | POA: Diagnosis not present

## 2022-10-21 ENCOUNTER — Other Ambulatory Visit (INDEPENDENT_AMBULATORY_CARE_PROVIDER_SITE_OTHER): Payer: Medicare Other

## 2022-10-21 ENCOUNTER — Other Ambulatory Visit: Payer: Self-pay | Admitting: Family Medicine

## 2022-10-21 DIAGNOSIS — E039 Hypothyroidism, unspecified: Secondary | ICD-10-CM | POA: Diagnosis not present

## 2022-10-21 LAB — TSH: TSH: 2.44 u[IU]/mL (ref 0.35–5.50)

## 2022-10-23 ENCOUNTER — Ambulatory Visit: Payer: Self-pay

## 2022-10-23 NOTE — Patient Outreach (Signed)
  Care Coordination   Follow Up Visit Note   10/23/2022 Name: Jenny Roberts MRN: [id number] DOB: 10/01/1945  Jenny Roberts is a 77 y.o. year old female who sees Jenny Roberts, Jenny Mao, MD for primary care. I spoke with  Jenny Roberts by phone today.  What matters to the patients health and wellness today?  Patient reports having cataract surgery 09/2022.  She states surgery went well.  Patient denies any UTI symptoms.  Patient reports right hip/ leg pain has been ongoing.  She states she will continue to follow up with her provider regarding this.  Patient denies any further needs or concerns and is agreeable that care coordination goals have been met.     Goals Addressed             This Visit's Progress    COMPLETED: Continued improvement post hospitalization and education on UTI's / falls       Interventions Today    Flowsheet Row Most Recent Value  Chronic Disease   Chronic disease during today's visit Other  [UTI's, chronic right hip and leg pain]  General Interventions   General Interventions Discussed/Reviewed General Interventions Reviewed, Doctor Visits  [evaluation of current treatment plan and patients adherence to plan as established by provider.  Assessed for ongoing UTI symptoms and pain level.]  Doctor Visits Discussed/Reviewed Doctor Visits Reviewed  Jenny Roberts upcoming provider visits. Advised to keep follow up visit with provider and notify of any worsening/ ongoing symptoms. Advised to contact orthopedic office and scheduled visit for right hip/ leg pain.]  Pharmacy Interventions   Pharmacy Dicussed/Reviewed Pharmacy Topics Reviewed  [medications discussed.  Discussed importance of compliance with medications.  Assessed patients medication regimen for pain management for right hip/ leg.]              SDOH assessments and interventions completed:  No     Care Coordination Interventions:  Yes, provided   Follow up plan: No further intervention required.    Encounter Outcome:  Patient Visit Completed   Jenny Roberts [facility] Care Coordination [phone] direct line

## 2022-10-23 NOTE — Patient Instructions (Signed)
Visit Information  Thank you for taking time to visit with me today. Your care coordination goals have been met. .   Following are the goals we discussed today:   Goals Addressed             This Visit's Progress    COMPLETED: Continued improvement post hospitalization and education on UTI's / falls       Interventions Today    Flowsheet Row Most Recent Value  Chronic Disease   Chronic disease during today's visit Other  [UTI's, chronic right hip and leg pain]  General Interventions   General Interventions Discussed/Reviewed General Interventions Reviewed, Doctor Visits  [evaluation of current treatment plan and patients adherence to plan as established by provider.  Assessed for ongoing UTI symptoms and pain level.]  Doctor Visits Discussed/Reviewed Doctor Visits Reviewed  Annabell Sabal upcoming provider visits. Advised to keep follow up visit with provider and notify of any worsening/ ongoing symptoms. Advised to contact orthopedic office and scheduled visit for right hip/ leg pain.]  Pharmacy Interventions   Pharmacy Dicussed/Reviewed Pharmacy Topics Reviewed  [medications discussed.  Discussed importance of compliance with medications.  Assessed patients medication regimen for pain management for right hip/ leg.]              Please contact your primary care provider or this RNCM if care coordination services are needed in the future.   If you are experiencing a Mental Health or Behavioral Health Crisis or need someone to talk to, please call the Suicide and Crisis Lifeline: 988 call 1-800-273-TALK (toll free, 24 hour hotline)  Patient verbalizes understanding of instructions and care plan provided today and agrees to view in MyChart. Active MyChart status and patient understanding of how to access instructions and care plan via MyChart confirmed with patient.     George Ina RN,BSN,CCM [facility] Care Coordination [phone] direct line

## 2022-11-07 DIAGNOSIS — H524 Presbyopia: Secondary | ICD-10-CM | POA: Diagnosis not present

## 2022-11-11 ENCOUNTER — Other Ambulatory Visit: Payer: Self-pay | Admitting: Family Medicine

## 2022-11-11 DIAGNOSIS — Z1231 Encounter for screening mammogram for malignant neoplasm of breast: Secondary | ICD-10-CM

## 2022-11-22 ENCOUNTER — Other Ambulatory Visit: Payer: Self-pay | Admitting: Family Medicine

## 2022-11-26 ENCOUNTER — Other Ambulatory Visit: Payer: Self-pay | Admitting: Family

## 2022-11-29 ENCOUNTER — Other Ambulatory Visit: Payer: Self-pay | Admitting: Family Medicine

## 2022-11-29 NOTE — Telephone Encounter (Signed)
Prescription Request  11/29/2022  LOV: 09/03/2022  What is the name of the medication or equipment?  clonazePAM (KLONOPIN) 0.5 MG tablet   Have you contacted your pharmacy to request a refill? Yes   Which pharmacy would you like this sent to?  [id number] - [place], [place] - [address] AT [facility] [address] [facility] [address] [place] [address] Phone: [phone] Fax: [phone]    Patient notified that their request is being sent to the clinical staff for review and that they should receive a response within 2 business days.   Please advise at [facility] [phone]

## 2022-11-29 NOTE — Telephone Encounter (Signed)
Sending to doc of day.   Pt only has 1 tab left.  Nov: 12/04/22

## 2022-11-29 NOTE — Addendum Note (Signed)
Addended by: Kristie Cowman on: 11/29/2022 02:56 PM   Modules accepted: Orders

## 2022-11-30 ENCOUNTER — Other Ambulatory Visit: Payer: Self-pay | Admitting: Internal Medicine

## 2022-11-30 MED ORDER — CLONAZEPAM 0.5 MG PO TABS: ORAL_TABLET | ORAL | 1 refills | Status: DC

## 2022-12-04 ENCOUNTER — Ambulatory Visit: Payer: Medicare Other | Admitting: Family Medicine

## 2022-12-04 ENCOUNTER — Encounter: Payer: Self-pay | Admitting: Family Medicine

## 2022-12-04 VITALS — BP 126/80 | HR 78 | Temp 97.7°F | Ht 63.0 in | Wt 146.6 lb

## 2022-12-04 DIAGNOSIS — F411 Generalized anxiety disorder: Secondary | ICD-10-CM | POA: Diagnosis not present

## 2022-12-04 DIAGNOSIS — M25551 Pain in right hip: Secondary | ICD-10-CM

## 2022-12-04 DIAGNOSIS — M810 Age-related osteoporosis without current pathological fracture: Secondary | ICD-10-CM | POA: Diagnosis not present

## 2022-12-04 LAB — VITAMIN D 25 HYDROXY (VIT D DEFICIENCY, FRACTURES): VITD: 49.98 ng/mL (ref 30.00–100.00)

## 2022-12-04 NOTE — Assessment & Plan Note (Signed)
Chronic issue.  Stable.  She can continue clonazepam 0.5 mg nightly as needed.

## 2022-12-04 NOTE — Assessment & Plan Note (Signed)
Neck issue.  She will see orthopedics as planned.

## 2022-12-04 NOTE — Progress Notes (Signed)
[name], MD Phone: [phone]  Jenny Roberts is a 77 y.o. female who presents today for follow-up.  Anxiety: Patient notes taking clonazepam nightly.  This helps calm her mind.  It does not make her drowsy the next day.  No depression or SI.  Osteoporosis: Patient is on Fosamax.  She takes vitamin D 1000 international units twice daily.  No recent fractures.  She is due for bone density scan.  Chronic hip pain: She is seeing orthopedics later this week to determine the next step in management.  Social History   Tobacco Use  Smoking Status Never  Smokeless Tobacco Never    Current Outpatient Medications on File Prior to Visit  Medication Sig Dispense Refill   acetaminophen (TYLENOL) 500 MG tablet Take 500 mg by mouth every 6 (six) hours as needed.     alendronate (FOSAMAX) 70 MG tablet Take 1 tablet (70 mg total) by mouth every 7 (seven) days. Take with a full glass of water on an empty stomach. 12 tablet 3   cholecalciferol (VITAMIN D) 1000 units tablet Take 1,000 Units by mouth 2 (two) times daily.     clonazePAM (KLONOPIN) 0.5 MG tablet TAKE 1 TABLET(0.5 MG) BY MOUTH DAILY 30 tablet 1   diclofenac (VOLTAREN) 50 MG EC tablet Take 1 tablet (50 mg total) by mouth 2 (two) times daily between meals as needed. 30 tablet 1   levothyroxine (SYNTHROID) 88 MCG tablet Take 1 tablet (88 mcg total) by mouth daily. 90 tablet 3   loperamide (IMODIUM) 2 MG capsule Take 2 capsules (4 mg total) by mouth every 6 (six) hours as needed for diarrhea or loose stools. 30 capsule 0   lovastatin (MEVACOR) 40 MG tablet TAKE 1 TABLET(40 MG) BY MOUTH DAILY 90 tablet 3   sertraline (ZOLOFT) 100 MG tablet TAKE 1 TABLET BY MOUTH DAILY 90 tablet 1   traMADol (ULTRAM) 50 MG tablet Take 50 mg by mouth every 12 (twelve) hours as needed.     gabapentin (NEURONTIN) 300 MG capsule Take 1 capsule (300 mg total) by mouth 2 (two) times daily. 60 capsule 3   No current facility-administered medications on file  prior to visit.     ROS see history of present illness  Objective  Physical Exam Vitals:   12/04/22 0957  BP: 126/80  Pulse: 78  Temp: 97.7 F (36.5 C)  SpO2: 98%    BP Readings from Last 3 Encounters:  12/04/22 126/80  09/03/22 120/70  07/29/22 130/70   Wt Readings from Last 3 Encounters:  12/04/22 146 lb 9.6 oz (66.5 kg)  09/04/22 146 lb (66.2 kg)  09/03/22 146 lb (66.2 kg)    Physical Exam Constitutional:      General: She is not in acute distress.    Appearance: She is not diaphoretic.  Cardiovascular:     Rate and Rhythm: Normal rate and regular rhythm.     Heart sounds: Normal heart sounds.  Pulmonary:     Effort: Pulmonary effort is normal.     Breath sounds: Normal breath sounds.  Skin:    General: Skin is warm and dry.  Neurological:     Mental Status: She is alert.      Assessment/Plan: Please see individual problem list.  Age-related osteoporosis without current pathological fracture Assessment & Plan: Chronic issue.  Patient will continue Fosamax 70 mg weekly.  She will continue vitamin D 1000 international units twice daily.  She will call to schedule her bone density scan.  We will check a vitamin D level today.  Orders: -     DG Bone Density; Future -     VITAMIN D 25 Hydroxy (Vit-D Deficiency, Fractures)  Right hip pain Assessment & Plan: Neck issue.  She will see orthopedics as planned.   Anxiety state Assessment & Plan: Chronic issue.  Stable.  She can continue clonazepam 0.5 mg nightly as needed.      Return in about 3 months (around 03/06/2023) for Transfer of care to [facility].   [name], MD [facility]

## 2022-12-04 NOTE — Patient Instructions (Signed)
Nice to see you. Please call [phone] to schedule your bone density scan. 

## 2022-12-04 NOTE — Assessment & Plan Note (Signed)
Chronic issue.  Patient will continue Fosamax 70 mg weekly.  She will continue vitamin D 1000 international units twice daily.  She will call to schedule her bone density scan.  We will check a vitamin D level today.

## 2022-12-06 DIAGNOSIS — M25551 Pain in right hip: Secondary | ICD-10-CM | POA: Diagnosis not present

## 2022-12-06 DIAGNOSIS — M1611 Unilateral primary osteoarthritis, right hip: Secondary | ICD-10-CM | POA: Diagnosis not present

## 2022-12-06 DIAGNOSIS — M7061 Trochanteric bursitis, right hip: Secondary | ICD-10-CM | POA: Diagnosis not present

## 2022-12-06 DIAGNOSIS — M5416 Radiculopathy, lumbar region: Secondary | ICD-10-CM | POA: Diagnosis not present

## 2022-12-13 ENCOUNTER — Ambulatory Visit
Admission: RE | Admit: 2022-12-13 | Discharge: 2022-12-13 | Disposition: A | Discharge status: Home / Self Care | Payer: Medicare Other | Source: Ambulatory Visit | Attending: Family Medicine | Admitting: Family Medicine

## 2022-12-13 DIAGNOSIS — Z1231 Encounter for screening mammogram for malignant neoplasm of breast: Secondary | ICD-10-CM | POA: Diagnosis not present

## 2022-12-17 DIAGNOSIS — M810 Age-related osteoporosis without current pathological fracture: Secondary | ICD-10-CM | POA: Diagnosis not present

## 2022-12-17 LAB — HM DEXA SCAN

## 2023-01-27 ENCOUNTER — Other Ambulatory Visit: Payer: Self-pay | Admitting: Internal Medicine

## 2023-02-05 DIAGNOSIS — M1611 Unilateral primary osteoarthritis, right hip: Secondary | ICD-10-CM | POA: Diagnosis not present

## 2023-02-14 ENCOUNTER — Other Ambulatory Visit: Payer: Self-pay | Admitting: Orthopedic Surgery

## 2023-02-18 ENCOUNTER — Telehealth: Payer: Self-pay | Admitting: Family Medicine

## 2023-02-18 NOTE — Telephone Encounter (Signed)
Patient is scheduled for 02/19/23 at 9:40 with you.

## 2023-02-18 NOTE — Telephone Encounter (Signed)
Surgical clearance form received on patient.  Patient needs to be scheduled for a surgical clearance visit.

## 2023-02-19 ENCOUNTER — Encounter: Payer: Self-pay | Admitting: Family Medicine

## 2023-02-19 ENCOUNTER — Other Ambulatory Visit: Payer: Self-pay | Admitting: Family Medicine

## 2023-02-19 ENCOUNTER — Ambulatory Visit (INDEPENDENT_AMBULATORY_CARE_PROVIDER_SITE_OTHER): Payer: Medicare Other | Admitting: Family Medicine

## 2023-02-19 VITALS — BP 132/72 | HR 74 | Temp 97.4°F | Resp 18 | Ht 63.0 in | Wt 146.0 lb

## 2023-02-19 DIAGNOSIS — R7303 Prediabetes: Secondary | ICD-10-CM | POA: Diagnosis not present

## 2023-02-19 DIAGNOSIS — Z01818 Encounter for other preprocedural examination: Secondary | ICD-10-CM

## 2023-02-19 LAB — POCT GLYCOSYLATED HEMOGLOBIN (HGB A1C): Hemoglobin A1C: 5.3 % (ref 4.0–5.6)

## 2023-02-19 NOTE — Assessment & Plan Note (Signed)
Preoperative exam completed today.  Patient's Jenny Roberts perioperative risk score is 0.2% placing her in a low risk category.  A1c to be drawn today given history of prediabetes.  She will have other lab work done during her preop appointment.  Surgical risk form completed and will be faxed to the surgeon.

## 2023-02-19 NOTE — Progress Notes (Signed)
[name], MD Phone: [phone]  Jenny Roberts is a 78 y.o. female who presents today for preop evaluation.   1) High Risk Cardiac Conditions  1) Recent MI - No.  2) Decompensated Heart Failure - No.  3) unstable angina - No.  4) Symptomatic arrythmia - No.  5) Sx Valvular Disease - No.  2) Intermediate Risk Factors - DM, CKD, CVA, CHF, CAD - No.  2) Functional Status - > 4 mets ( Walk, run, climb stairs ) Yes.    3) Surgery Specific Risk - Intermediate (Orthopaedic)  4) Further Noninvasive evaluation -   1) EKG - No.   1) Hx of CVA, CAD, DM, CKD - none  2) Echo - No.   1) Worsening dyspnea - no  3) Stress Testing - Active Cardiac Disease - No.  Social History   Tobacco Use  Smoking Status Never  Smokeless Tobacco Never    Current Outpatient Medications on File Prior to Visit  Medication Sig Dispense Refill   acetaminophen (TYLENOL) 500 MG tablet Take 500 mg by mouth every 6 (six) hours as needed.     alendronate (FOSAMAX) 70 MG tablet Take 1 tablet (70 mg total) by mouth every 7 (seven) days. Take with a full glass of water on an empty stomach. 12 tablet 3   cholecalciferol (VITAMIN D) 1000 units tablet Take 1,000 Units by mouth 2 (two) times daily.     clonazePAM (KLONOPIN) 0.5 MG tablet TAKE 1 TABLET(0.5 MG) BY MOUTH DAILY 30 tablet 0   diclofenac (VOLTAREN) 50 MG EC tablet Take 1 tablet (50 mg total) by mouth 2 (two) times daily between meals as needed. 30 tablet 1   levothyroxine (SYNTHROID) 88 MCG tablet Take 1 tablet (88 mcg total) by mouth daily. 90 tablet 3   loperamide (IMODIUM) 2 MG capsule Take 2 capsules (4 mg total) by mouth every 6 (six) hours as needed for diarrhea or loose stools. 30 capsule 0   sertraline (ZOLOFT) 100 MG tablet TAKE 1 TABLET BY MOUTH DAILY 90 tablet 1   traMADol (ULTRAM) 50 MG tablet Take 50 mg by mouth every 12 (twelve) hours as needed.     gabapentin (NEURONTIN) 300 MG capsule Take 1 capsule (300 mg total) by mouth 2 (two)  times daily. 60 capsule 3   No current facility-administered medications on file prior to visit.     ROS see history of present illness  Objective  Physical Exam Vitals:   02/19/23 0944  BP: 132/72  Pulse: 74  Resp: 18  Temp: (!) 97.4 F (36.3 C)  SpO2: 96%    BP Readings from Last 3 Encounters:  02/19/23 132/72  12/04/22 126/80  09/03/22 120/70   Wt Readings from Last 3 Encounters:  02/19/23 146 lb (66.2 kg)  12/04/22 146 lb 9.6 oz (66.5 kg)  09/04/22 146 lb (66.2 kg)    Physical Exam Constitutional:      General: She is not in acute distress.    Appearance: She is not diaphoretic.  Cardiovascular:     Rate and Rhythm: Normal rate and regular rhythm.     Heart sounds: Normal heart sounds.  Pulmonary:     Effort: Pulmonary effort is normal.     Breath sounds: Normal breath sounds.  Musculoskeletal:     Right lower leg: No edema.     Left lower leg: No edema.  Skin:    General: Skin is warm and dry.  Neurological:     Mental Status:  She is alert.      Assessment/Plan: Please see individual problem list.  Preop examination Assessment & Plan: Preoperative exam completed today.  Patient's Chales Abrahams perioperative risk score is 0.2% placing her in a low risk category.  A1c to be drawn today given history of prediabetes.  She will have other lab work done during her preop appointment.  Surgical risk form completed and will be faxed to the surgeon.   Prediabetes -     POCT glycosylated hemoglobin (Hb A1C)    Return for as scheduled.   [name], MD [facility] Primary Care [facility]

## 2023-02-26 ENCOUNTER — Other Ambulatory Visit: Payer: Self-pay

## 2023-02-26 ENCOUNTER — Encounter
Admission: RE | Admit: 2023-02-26 | Discharge: 2023-02-26 | Disposition: A | Discharge status: Home / Self Care | Payer: Medicare Other | Source: Ambulatory Visit | Attending: Orthopedic Surgery | Admitting: Orthopedic Surgery

## 2023-02-26 ENCOUNTER — Encounter: Payer: Self-pay | Admitting: Urgent Care

## 2023-02-26 ENCOUNTER — Other Ambulatory Visit: Payer: Medicare Other

## 2023-02-26 VITALS — BP 132/76 | HR 72 | Temp 97.4°F | Resp 16 | Ht 63.0 in | Wt 149.0 lb

## 2023-02-26 DIAGNOSIS — Z01818 Encounter for other preprocedural examination: Secondary | ICD-10-CM | POA: Insufficient documentation

## 2023-02-26 DIAGNOSIS — M1611 Unilateral primary osteoarthritis, right hip: Secondary | ICD-10-CM | POA: Diagnosis not present

## 2023-02-26 DIAGNOSIS — R9431 Abnormal electrocardiogram [ECG] [EKG]: Secondary | ICD-10-CM | POA: Diagnosis not present

## 2023-02-26 DIAGNOSIS — R8271 Bacteriuria: Secondary | ICD-10-CM | POA: Insufficient documentation

## 2023-02-26 DIAGNOSIS — Z01812 Encounter for preprocedural laboratory examination: Secondary | ICD-10-CM

## 2023-02-26 DIAGNOSIS — R8281 Pyuria: Secondary | ICD-10-CM | POA: Diagnosis not present

## 2023-02-26 DIAGNOSIS — R8290 Unspecified abnormal findings in urine: Secondary | ICD-10-CM | POA: Diagnosis not present

## 2023-02-26 HISTORY — DX: Age-related osteoporosis without current pathological fracture: M81.0

## 2023-02-26 HISTORY — DX: Spinal stenosis, lumbar region with neurogenic claudication: M48.062

## 2023-02-26 HISTORY — DX: Other intervertebral disc degeneration, lumbar region without mention of lumbar back pain or lower extremity pain: M51.369

## 2023-02-26 HISTORY — DX: Thrombocytopenia, unspecified: D69.6

## 2023-02-26 HISTORY — DX: Nausea with vomiting, unspecified: R11.2

## 2023-02-26 HISTORY — DX: Hypothyroidism, unspecified: E03.9

## 2023-02-26 HISTORY — DX: Other specified postprocedural states: Z98.890

## 2023-02-26 HISTORY — DX: Vitamin D deficiency, unspecified: E55.9

## 2023-02-26 LAB — URINALYSIS, ROUTINE W REFLEX MICROSCOPIC
Bilirubin Urine: NEGATIVE
Glucose, UA: NEGATIVE mg/dL
Hgb urine dipstick: NEGATIVE
Ketones, ur: NEGATIVE mg/dL
Nitrite: NEGATIVE
Protein, ur: NEGATIVE mg/dL
Specific Gravity, Urine: 1.027 (ref 1.005–1.030)
pH: 5 (ref 5.0–8.0)

## 2023-02-26 LAB — SURGICAL PCR SCREEN
MRSA, PCR: NEGATIVE
Staphylococcus aureus: NEGATIVE

## 2023-02-26 LAB — CBC WITH DIFFERENTIAL/PLATELET
Abs Immature Granulocytes: 0.02 10*3/uL (ref 0.00–0.07)
Basophils Absolute: 0 10*3/uL (ref 0.0–0.1)
Basophils Relative: 0 %
Eosinophils Absolute: 0.1 10*3/uL (ref 0.0–0.5)
Eosinophils Relative: 2 %
HCT: 40.2 % (ref 36.0–46.0)
Hemoglobin: 13 g/dL (ref 12.0–15.0)
Immature Granulocytes: 1 %
Lymphocytes Relative: 18 %
Lymphs Abs: 0.6 10*3/uL — ABNORMAL LOW (ref 0.7–4.0)
MCH: 28.6 pg (ref 26.0–34.0)
MCHC: 32.3 g/dL (ref 30.0–36.0)
MCV: 88.4 fL (ref 80.0–100.0)
Monocytes Absolute: 0.7 10*3/uL (ref 0.1–1.0)
Monocytes Relative: 20 %
Neutro Abs: 2.1 10*3/uL (ref 1.7–7.7)
Neutrophils Relative %: 59 %
Platelets: 134 10*3/uL — ABNORMAL LOW (ref 150–400)
RBC: 4.55 MIL/uL (ref 3.87–5.11)
RDW: 12.7 % (ref 11.5–15.5)
WBC: 3.5 10*3/uL — ABNORMAL LOW (ref 4.0–10.5)
nRBC: 0 % (ref 0.0–0.2)

## 2023-02-26 LAB — COMPREHENSIVE METABOLIC PANEL
ALT: 22 U/L (ref 0–44)
AST: 23 U/L (ref 15–41)
Albumin: 4.3 g/dL (ref 3.5–5.0)
Alkaline Phosphatase: 64 U/L (ref 38–126)
Anion gap: 9 (ref 5–15)
BUN: 22 mg/dL (ref 8–23)
CO2: 25 mmol/L (ref 22–32)
Calcium: 9.1 mg/dL (ref 8.9–10.3)
Chloride: 106 mmol/L (ref 98–111)
Creatinine, Ser: 0.72 mg/dL (ref 0.44–1.00)
GFR, Estimated: >60 mL/min (ref >60)
Glucose, Bld: 113 mg/dL — ABNORMAL HIGH (ref 70–99)
Potassium: 4 mmol/L (ref 3.5–5.1)
Sodium: 140 mmol/L (ref 135–145)
Total Bilirubin: 0.6 mg/dL (ref 0.0–1.2)
Total Protein: 6.8 g/dL (ref 6.5–8.1)

## 2023-02-26 LAB — TYPE AND SCREEN
ABO/RH(D): O POS
Antibody Screen: NEGATIVE

## 2023-02-26 NOTE — Patient Instructions (Addendum)
 Your procedure is scheduled on: Monday, February 17 Report to the Registration Desk on the 1st floor of the [facility] To find out your arrival time, please call [phone] between 1PM - 3PM on: Friday, February 14 If your arrival time is 6:00 am, do not arrive before that time as the [facility] entrance doors do not open until 6:00 am.  REMEMBER: Instructions that are not followed completely may result in serious medical risk, up to and including death; or upon the discretion of your surgeon and anesthesiologist your surgery may need to be rescheduled.  Do not eat food after midnight the night before surgery.  No gum chewing or hard candies.  You may however, drink CLEAR liquids up to 2 hours before you are scheduled to arrive for your surgery. Do not drink anything within 2 hours of your scheduled arrival time.  Clear liquids include: - water  - apple juice without pulp - gatorade (not RED colors) - black coffee or tea (Do NOT add milk or creamers to the coffee or tea) Do NOT drink anything that is not on this list.  In addition, your doctor has ordered for you to drink the provided:  Ensure Pre-Surgery Clear Carbohydrate Drink  Drinking this carbohydrate drink up to two hours before surgery helps to reduce insulin resistance and improve patient outcomes. Please complete drinking 2 hours before scheduled arrival time.  One week prior to surgery: starting February 10 Stop Anti-inflammatories (NSAIDS) such as Advil , Aleve , Ibuprofen , Motrin , Naproxen , Naprosyn  and Aspirin based products such as Excedrin, Goody's Powder, BC Powder. Stop ANY OVER THE COUNTER supplements until after surgery.  You may however, continue to take Tylenol  if needed for pain up until the day of surgery.  Continue taking all of your other prescription medications up until the day of surgery.  ON THE DAY OF SURGERY ONLY TAKE THESE MEDICATIONS WITH SIPS OF WATER:  Levothyroxine   No Alcohol for 24  hours before or after surgery.  No Smoking including e-cigarettes for 24 hours before surgery.  No chewable tobacco products for at least 6 hours before surgery.  No nicotine patches on the day of surgery.  Do not use any recreational drugs for at least a week (preferably 2 weeks) before your surgery.  Please be advised that the combination of cocaine and anesthesia may have negative outcomes, up to and including death. If you test positive for cocaine, your surgery will be cancelled.  On the morning of surgery brush your teeth with toothpaste and water, you may rinse your mouth with mouthwash if you wish. Do not swallow any toothpaste or mouthwash.  Use CHG Soap as directed on instruction sheet.  Do not wear jewelry, make-up, hairpins, clips or nail polish.  For welded (permanent) jewelry: bracelets, anklets, waist bands, etc.  Please have this removed prior to surgery.  If it is not removed, there is a chance that hospital personnel will need to cut it off on the day of surgery.  Do not wear lotions, powders, or perfumes.   Do not shave body hair from the neck down 48 hours before surgery.  Contact lenses, hearing aids and dentures may not be worn into surgery.  Do not bring valuables to the hospital. [facility] is not responsible for any missing/lost belongings or valuables.   Notify your doctor if there is any change in your medical condition (cold, fever, infection).  Wear comfortable clothing (specific to your surgery type) to the hospital.  After surgery, you  can help prevent lung complications by doing breathing exercises.  Take deep breaths and cough every 1-2 hours. Your doctor may order a device called an Incentive Spirometer to help you take deep breaths.  If you are being admitted to the hospital overnight, leave your suitcase in the car. After surgery it may be brought to your room.  In case of increased patient census, it may be necessary for you, the patient, to  continue your postoperative care in the Same Day Surgery department.  If you are being discharged the day of surgery, you will not be allowed to drive home. You will need a responsible individual to drive you home and stay with you for 24 hours after surgery.   If you are taking public transportation, you will need to have a responsible individual with you.  Please call the Pre-admissions Testing Dept. at [phone] if you have any questions about these instructions.  Surgery Visitation Policy:  Patients having surgery or a procedure may have two visitors.  Children under the age of 40 must have an adult with them who is not the patient.  Temporary Visitor Restrictions Due to increasing cases of flu, RSV and COVID-19: Children ages 38 and under will not be able to visit patients in [facility] hospitals under most circumstances.  Inpatient Visitation:    Visiting hours are 7 a.m. to 8 p.m. Up to four visitors are allowed at one time in a patient room. The visitors may rotate out with other people during the day.  One visitor age 2 or older may stay with the patient overnight and must be in the room by 8 p.m.     Pre-operative 5 CHG Bath Instructions   You can play a key role in reducing the risk of infection after surgery. Your skin needs to be as free of germs as possible. You can reduce the number of germs on your skin by washing with CHG (chlorhexidine  gluconate) soap before surgery. CHG is an antiseptic soap that kills germs and continues to kill germs even after washing.   DO NOT use if you have an allergy to chlorhexidine /CHG or antibacterial soaps. If your skin becomes reddened or irritated, stop using the CHG and notify one of our RNs at [phone].   Please shower with the CHG soap starting 4 days before surgery using the following schedule:     Please keep in mind the following:  DO NOT shave, including legs and underarms, starting the day of your first shower.    You may shave your face at any point before/day of surgery.  Place clean sheets on your bed the day you start using CHG soap. Use a clean washcloth (not used since being washed) for each shower. DO NOT sleep with pets once you start using the CHG.   CHG Shower Instructions:  If you choose to wash your hair and private area, wash first with your normal shampoo/soap.  After you use shampoo/soap, rinse your hair and body thoroughly to remove shampoo/soap residue.  Turn the water OFF and apply about 3 tablespoons (45 ml) of CHG soap to a CLEAN washcloth.  Apply CHG soap ONLY FROM YOUR NECK DOWN TO YOUR TOES (washing for 3-5 minutes)  DO NOT use CHG soap on face, private areas, open wounds, or sores.  Pay special attention to the area where your surgery is being performed.  If you are having back surgery, having someone wash your back for you may be helpful. Wait 2  minutes after CHG soap is applied, then you may rinse off the CHG soap.  Pat dry with a clean towel  Put on clean clothes/pajamas   If you choose to wear lotion, please use ONLY the CHG-compatible lotions on the back of this paper.     Additional instructions for the day of surgery: DO NOT APPLY any lotions, deodorants, cologne, or perfumes.   Put on clean/comfortable clothes.  Brush your teeth.  Ask your nurse before applying any prescription medications to the skin.      CHG Compatible Lotions   Aveeno Moisturizing lotion  Cetaphil Moisturizing Cream  Cetaphil Moisturizing Lotion  Clairol Herbal Essence Moisturizing Lotion, Dry Skin  Clairol Herbal Essence Moisturizing Lotion, Extra Dry Skin  Clairol Herbal Essence Moisturizing Lotion, Normal Skin  Curel Age Defying Therapeutic Moisturizing Lotion with Alpha Hydroxy  Curel Extreme Care Body Lotion  Curel Soothing Hands Moisturizing Hand Lotion  Curel Therapeutic Moisturizing Cream, Fragrance-Free  Curel Therapeutic Moisturizing Lotion, Fragrance-Free  Curel  Therapeutic Moisturizing Lotion, Original Formula  Eucerin Daily Replenishing Lotion  Eucerin Dry Skin Therapy Plus Alpha Hydroxy Crme  Eucerin Dry Skin Therapy Plus Alpha Hydroxy Lotion  Eucerin Original Crme  Eucerin Original Lotion  Eucerin Plus Crme Eucerin Plus Lotion  Eucerin TriLipid Replenishing Lotion  Keri Anti-Bacterial Hand Lotion  Keri Deep Conditioning Original Lotion Dry Skin Formula Softly Scented  Keri Deep Conditioning Original Lotion, Fragrance Free Sensitive Skin Formula  Keri Lotion Fast Absorbing Fragrance Free Sensitive Skin Formula  Keri Lotion Fast Absorbing Softly Scented Dry Skin Formula  Keri Original Lotion  Keri Skin Renewal Lotion Keri Silky Smooth Lotion  Keri Silky Smooth Sensitive Skin Lotion  Nivea Body Creamy Conditioning Oil  Nivea Body Extra Enriched Lotion  Nivea Body Original Lotion  Nivea Body Sheer Moisturizing Lotion Nivea Crme  Nivea Skin Firming Lotion  NutraDerm 30 Skin Lotion  NutraDerm Skin Lotion  NutraDerm Therapeutic Skin Cream  NutraDerm Therapeutic Skin Lotion  ProShield Protective Hand Cream  Provon moisturizing lotion         Preoperative Educational Videos for Total Hip, Knee and Shoulder Replacements  To better prepare for surgery, please view our videos that explain the physical activity and discharge planning required to have the best surgical recovery at [facility].  [email or web address]  Questions? Call [phone] or email [email or web address]

## 2023-02-27 ENCOUNTER — Other Ambulatory Visit: Payer: Self-pay | Admitting: Family Medicine

## 2023-02-28 ENCOUNTER — Telehealth: Payer: Self-pay | Admitting: Urgent Care

## 2023-02-28 DIAGNOSIS — Z01812 Encounter for preprocedural laboratory examination: Secondary | ICD-10-CM

## 2023-02-28 DIAGNOSIS — B965 Pseudomonas (aeruginosa) (mallei) (pseudomallei) as the cause of diseases classified elsewhere: Secondary | ICD-10-CM

## 2023-02-28 LAB — URINE CULTURE: Culture: 40000 — AB

## 2023-02-28 MED ORDER — CIPROFLOXACIN HCL 500 MG PO TABS: 500.0000 mg | ORAL_TABLET | Freq: Two times a day (BID) | ORAL | 0 refills | Status: AC

## 2023-02-28 NOTE — Progress Notes (Signed)
 [facility] Perioperative Services: Pre-Admission/Anesthesia Testing  Abnormal Lab Notification and Treatment Plan of Care   Date: 02/28/23  Name: Jenny Roberts MRN:   [id number]  Re: Abnormal labs noted during PAT appointment   Notified:  Provider Name Provider Role Notification Mode  Arthea Sheer, MD Orthopedics (Surgeon) Routed and/or faxed via [facility]   Abnormal Lab Value(s):   Lab Results  Component Value Date   COLORURINE YELLOW (A) 02/26/2023   APPEARANCEUR HAZY (A) 02/26/2023   LABSPEC 1.027 02/26/2023   PHURINE 5.0 02/26/2023   GLUCOSEU NEGATIVE 02/26/2023   HGBUR NEGATIVE 02/26/2023   BILIRUBINUR NEGATIVE 02/26/2023   KETONESUR NEGATIVE 02/26/2023   PROTEINUR NEGATIVE 02/26/2023   UROBILINOGEN 0.2 06/19/2022   NITRITE NEGATIVE 02/26/2023   LEUKOCYTESUR MODERATE (A) 02/26/2023   EPIU 0-5 02/26/2023   WBCU 11-20 02/26/2023   RBCU 0-5 02/26/2023   BACTERIA FEW (A) 02/26/2023   CULT 40,000 COLONIES/mL PSEUDOMONAS AERUGINOSA (A) 02/26/2023    Clinical Information and Notes:  Patient is scheduled for RIGHT POSTERIOR TOTAL HIP ARTHROPLASTY  on 03/10/2023.    UA performed in PAT consistent with/concerning for infection.  No leukocytosis noted on CBC; WBC 3.5 Renal function: Estimated Creatinine Clearance: 54.4 mL/min (by C-G formula based on SCr of 0.72 mg/dL). Urine C&S added to assess for pathogenically significant growth.  Impression and Plan:  Jenny Roberts with a UA that was (+) for infection; reflex culture sent. Contacted patient to discuss. Patient reporting that she is experiencing minor LUTS. Suspect early infection. Patient reporting that she has been careful following a very bad UTI back in April of this year. Patient with surgery scheduled soon. In efforts to avoid delaying patient's procedure, or have her experience any potentially significant perioperative complications related to the aforementioned, I would like to proceed  with empiric treatment for urinary tract infection.  Allergies reviewed. Culture report also reviewed to ensure culture appropriate coverage is being provided. Patient with quinolone susceptible Pseudomonas. Will treat with a 3 day course of CIPROFLOXACIN . Patient encouraged to complete the entire course of antibiotics even if she begins to feel better.   Meds ordered this encounter  Medications   ciprofloxacin  (CIPRO ) 500 MG tablet    Sig: Take 1 tablet (500 mg total) by mouth 2 (two) times daily for 3 days. Increase WATER intake while taking this medication.    Dispense:  6 tablet    Refill:  0   Patient encouraged to increase her fluid intake as much as possible. Discussed that water is always best to flush the urinary tract. She was advised to avoid caffeine containing fluids until her infections clears, as caffeine can cause her to experience painful bladder spasms.   May use Tylenol  as needed for pain/fever should she experience these symptoms.  Patient instructed to call surgeon's office or PAT with any questions or concerns related to the above outlined course of treatment. Additionally, she was instructed to call if she feels like she is getting worse overall while on treatment. I encouraged her to write my in MyChart over the weekend if she needed anything. Results and treatment plan of care forwarded to primary attending surgeon to make them aware.   Encounter Diagnoses  Name Primary?   Pre-operative laboratory examination Yes   Pseudomonas urinary tract infection    Dorise Pereyra, MSN, APRN, FNP-C, CEN [facility]  Perioperative Services Nurse Practitioner Phone: [phone] Fax: [phone] 02/28/23 4:54 PM  NOTE: This note has been  prepared using Scientist, clinical (histocompatibility and immunogenetics). Despite my best ability to proofread, there is always the potential that unintentional transcriptional errors may still occur from this process.

## 2023-03-06 ENCOUNTER — Ambulatory Visit: Payer: Self-pay | Admitting: Family Medicine

## 2023-03-06 DIAGNOSIS — U071 COVID-19: Secondary | ICD-10-CM

## 2023-03-06 HISTORY — DX: COVID-19: U07.1

## 2023-03-06 NOTE — Telephone Encounter (Signed)
Chief Complaint: Covid positive Symptoms: headache, body aches, mild cough Frequency: since yesterday Pertinent Negatives: Patient denies fever Disposition: [] ED /[] Urgent Care (no appt availability in office) / [] Appointment(In office/virtual)/ []  [facility]/ [x] Home Care/ [] Refused Recommended Disposition /[] [facility]/ []  Follow-up with PCP Additional Notes: Patient called in stating she tested positive yesterday for Covid with two home tests. Patient was exposed at a family function. Patient's symptoms began yesterday and she is experiencing headache, fatigue, body aches and mild cough. Advised patient on home care instructions. Educated patient to monitor oxygen levels and temperature for fever and to call us back if O2 gets to 90 or below, and fever gets to 102. Patient verbalized understanding.    Copied from CRM [id number]. Topic: Clinical - Red Word Triage >> Mar 06, 2023  9:59 AM Denese Killings wrote: Red Word that prompted transfer to Nurse Triage: Patient tested positive for COVID. Symptoms- headache, achy, fatigue, and hoarse. Patient states that she doesn't know what to do. Reason for Disposition  [1] COVID-19 diagnosed by doctor (or NP/PA) AND [2] mild symptoms (e.g., cough, fever, others) AND [3] no complications or SOB  Answer Assessment - Initial Assessment Questions 1. COVID-19 DIAGNOSIS: "How do you know that you have COVID?" (e.g., positive lab test or self-test, diagnosed by doctor or NP/PA, symptoms after exposure).     Two home tests both were positive - tested positive yesterday 2. COVID-19 EXPOSURE: "Was there any known exposure to COVID before the symptoms began?" CDC Definition of close contact: within 6 feet (2 meters) for a total of 15 minutes or more over a 24-hour period.      "Yes I got a call two days ago and told I was exposed to Covid at family function" 3. ONSET: "When did the COVID-19 symptoms start?"      Yesterday 4. WORST SYMPTOM:  "What is your worst symptom?" (e.g., cough, fever, shortness of breath, muscle aches)     Headache 5. COUGH: "Do you have a cough?" If Yes, ask: "How bad is the cough?"       Yes, mild 6. FEVER: "Do you have a fever?" If Yes, ask: "What is your temperature, how was it measured, and when did it start?"     No 7. RESPIRATORY STATUS: "Describe your breathing?" (e.g., normal; shortness of breath, wheezing, unable to speak)      Yes - pretty good 8. BETTER-SAME-WORSE: "Are you getting better, staying the same or getting worse compared to yesterday?"  If getting worse, ask, "In what way?"     Worse 9. OTHER SYMPTOMS: "Do you have any other symptoms?"  (e.g., chills, fatigue, headache, loss of smell or taste, muscle pain, sore throat)     Headache, body aches, fatigue, cough 10. HIGH RISK DISEASE: "Do you have any chronic medical problems?" (e.g., asthma, heart or lung disease, weak immune system, obesity, etc.)       No 11. VACCINE: "Have you had the COVID-19 vaccine?" If Yes, ask: "Which one, how many shots, when did you get it?"       Yes, unsure of which one - vaccinated of 2024 13. O2 SATURATION MONITOR:  "Do you use an oxygen saturation monitor (pulse oximeter) at home?" If Yes, ask "What is your reading (oxygen level) today?" "What is your usual oxygen saturation reading?" (e.g., 95%)       Yes I have a way to monitor, but have not checked it  Protocols used: Coronavirus (COVID-19) Diagnosed or Suspected-A-AH

## 2023-03-07 ENCOUNTER — Encounter: Payer: Medicare Other | Admitting: Nurse Practitioner

## 2023-03-10 ENCOUNTER — Encounter: Admission: RE | Payer: Self-pay | Source: Home / Self Care

## 2023-03-10 ENCOUNTER — Ambulatory Visit: Admission: RE | Admit: 2023-03-10 | Payer: Medicare Other | Source: Home / Self Care | Admitting: Orthopedic Surgery

## 2023-03-10 DIAGNOSIS — Z01812 Encounter for preprocedural laboratory examination: Secondary | ICD-10-CM

## 2023-03-10 SURGERY — TOTAL HIP ARTHROPLASTY
Anesthesia: Choice | Site: Hip | Laterality: Right

## 2023-03-10 NOTE — Telephone Encounter (Signed)
Called patient to offer appointment. Patient says she is feeling better.

## 2023-03-18 ENCOUNTER — Other Ambulatory Visit: Payer: Self-pay | Admitting: Family Medicine

## 2023-03-28 ENCOUNTER — Other Ambulatory Visit: Payer: Self-pay | Admitting: Orthopedic Surgery

## 2023-04-02 DIAGNOSIS — M1611 Unilateral primary osteoarthritis, right hip: Secondary | ICD-10-CM | POA: Diagnosis not present

## 2023-04-03 ENCOUNTER — Telehealth: Payer: Self-pay

## 2023-04-03 ENCOUNTER — Other Ambulatory Visit: Payer: Self-pay | Admitting: Nurse Practitioner

## 2023-04-03 NOTE — Telephone Encounter (Signed)
 Copied from CRM [id number]. Topic: Clinical - Medical Advice >> Apr 03, 2023  2:41 PM Elizebeth Brooking wrote: Reason for CRM: Patient thinks she is getting a yeast infection, wanted to know if there is anyway she could get an antibiotic sent to the pharmacy or does she needs to see someone before. Would like for someone to give her a call regarding this

## 2023-04-03 NOTE — Telephone Encounter (Unsigned)
 Copied from CRM [id number]. Topic: Clinical - Medication Refill >> Apr 03, 2023  2:40 PM Elizebeth Brooking wrote: Most Recent Primary Care Visit:  Provider: Birdie Sons ERIC G  Department: [facility]  Visit Type: OFFICE VISIT  Date: 02/19/2023  Medication: clonazePAM (KLONOPIN) 0.5 MG tablet  Has the patient contacted their pharmacy? Yes (Agent: If no, request that the patient contact the pharmacy for the refill. If patient does not wish to contact the pharmacy document the reason why and proceed with request.) (Agent: If yes, when and what did the pharmacy advise?)  Is this the correct pharmacy for this prescription? Yes If no, delete pharmacy and type the correct one.  This is the patient's preferred pharmacy:  [id number] - [place], [place] - [address] AT [facility] [address] [facility] [address] [place] [address] Phone: [phone] Fax: [phone]   Has the prescription been filled recently? No  Is the patient out of the medication? Yes  Has the patient been seen for an appointment in the last year OR does the patient have an upcoming appointment? Yes  Can we respond through MyChart? Yes  Agent: Please be advised that Rx refills may take up to 3 business days. We ask that you follow-up with your pharmacy.

## 2023-04-03 NOTE — Telephone Encounter (Signed)
 Spoke with pt and she stated that she is having burning with urination. I have scheduled pt with Bethanie Dicker, NP tomorrow to be checked for a UTI.

## 2023-04-04 ENCOUNTER — Ambulatory Visit: Admitting: Nurse Practitioner

## 2023-04-04 ENCOUNTER — Encounter
Admission: RE | Admit: 2023-04-04 | Discharge: 2023-04-04 | Disposition: A | Discharge status: Home / Self Care | Source: Ambulatory Visit | Attending: Orthopedic Surgery | Admitting: Orthopedic Surgery

## 2023-04-04 ENCOUNTER — Inpatient Hospital Stay: Admission: RE | Admit: 2023-04-04 | Source: Ambulatory Visit

## 2023-04-04 ENCOUNTER — Encounter: Payer: Self-pay | Admitting: Nurse Practitioner

## 2023-04-04 VITALS — BP 136/80 | HR 77 | Temp 97.8°F | Ht 63.0 in | Wt 147.8 lb

## 2023-04-04 DIAGNOSIS — G479 Sleep disorder, unspecified: Secondary | ICD-10-CM

## 2023-04-04 DIAGNOSIS — F411 Generalized anxiety disorder: Secondary | ICD-10-CM

## 2023-04-04 DIAGNOSIS — R300 Dysuria: Secondary | ICD-10-CM

## 2023-04-04 DIAGNOSIS — M25551 Pain in right hip: Secondary | ICD-10-CM

## 2023-04-04 DIAGNOSIS — Z01818 Encounter for other preprocedural examination: Secondary | ICD-10-CM

## 2023-04-04 HISTORY — DX: Urinary tract infection, site not specified: N39.0

## 2023-04-04 LAB — POC URINALSYSI DIPSTICK (AUTOMATED)
Blood, UA: NEGATIVE
Glucose, UA: NEGATIVE
Ketones, UA: NEGATIVE
Nitrite, UA: NEGATIVE
Protein, UA: NEGATIVE
Spec Grav, UA: 1.025 (ref 1.010–1.025)
Urobilinogen, UA: 0.2 U/dL
pH, UA: 5.5 (ref 5.0–8.0)

## 2023-04-04 LAB — URINALYSIS, ROUTINE W REFLEX MICROSCOPIC
Bilirubin Urine: NEGATIVE
Hgb urine dipstick: NEGATIVE
Ketones, ur: NEGATIVE
Nitrite: NEGATIVE
Specific Gravity, Urine: 1.025 (ref 1.000–1.030)
Total Protein, Urine: NEGATIVE
Urine Glucose: NEGATIVE
Urobilinogen, UA: 0.2 (ref 0.0–1.0)
pH: 6 (ref 5.0–8.0)

## 2023-04-04 MED ORDER — CLONAZEPAM 0.5 MG PO TABS
0.5000 mg | ORAL_TABLET | Freq: Every day | ORAL | 2 refills | Status: DC
Start: 2023-04-04 — End: 2023-06-11

## 2023-04-04 MED ORDER — NITROFURANTOIN MONOHYD MACRO 100 MG PO CAPS: 100.0000 mg | ORAL_CAPSULE | Freq: Two times a day (BID) | ORAL | 0 refills | Status: DC

## 2023-04-04 NOTE — Patient Instructions (Addendum)
 Your procedure is scheduled on:04-17-23 Thursday Report to the Registration Desk on the 1st floor of the [facility].Then proceed to the 2nd floor Surgery Desk To find out your arrival time, please call [phone] between 1PM - 3PM on:04-16-23 Wednesday If your arrival time is 6:00 am, do not arrive before that time as the [facility] entrance doors do not open until 6:00 am.  REMEMBER: Instructions that are not followed completely may result in serious medical risk, up to and including death; or upon the discretion of your surgeon and anesthesiologist your surgery may need to be rescheduled.  Do not eat food after midnight the night before surgery.  No gum chewing or hard candies.  You may however, drink CLEAR liquids up to 2 hours before you are scheduled to arrive for your surgery. Do not drink anything within 2 hours of your scheduled arrival time.  Clear liquids include: - water  - apple juice without pulp - gatorade (not RED colors) - black coffee or tea (Do NOT add milk or creamers to the coffee or tea) Do NOT drink anything that is not on this list.  In addition, your doctor has ordered for you to drink the provided:  Ensure Pre-Surgery Clear Carbohydrate Drink  Drinking this carbohydrate drink up to two hours before surgery helps to reduce insulin resistance and improve patient outcomes. Please complete drinking 2 hours before scheduled arrival time.  One week prior to surgery:Last dose will be on 04-09-23 Stop Anti-inflammatories (NSAIDS) such as Advil, Aleve, Ibuprofen, Motrin, Naproxen, Naprosyn and Aspirin based products such as Excedrin, Goody's Powder, BC Powder. Stop ANY OVER THE COUNTER supplements until after surgery.  You may however, continue to take Tylenol if needed for pain up until the day of surgery.  Continue taking all of your other prescription medications up until the day of surgery.  ON THE DAY OF SURGERY ONLY TAKE THESE MEDICATIONS WITH SIPS OF  WATER: -levothyroxine (SYNTHROID)   No Alcohol for 24 hours before or after surgery.  No Smoking including e-cigarettes for 24 hours before surgery.  No chewable tobacco products for at least 6 hours before surgery.  No nicotine patches on the day of surgery.  Do not use any "recreational" drugs for at least a week (preferably 2 weeks) before your surgery.  Please be advised that the combination of cocaine and anesthesia may have negative outcomes, up to and including death. If you test positive for cocaine, your surgery will be cancelled.  On the morning of surgery brush your teeth with toothpaste and water, you may rinse your mouth with mouthwash if you wish. Do not swallow any toothpaste or mouthwash.  Use CHG Soap as directed on instruction sheet.  Do not wear jewelry, make-up, hairpins, clips or nail polish.  For welded (permanent) jewelry: bracelets, anklets, waist bands, etc.  Please have this removed prior to surgery.  If it is not removed, there is a chance that hospital personnel will need to cut it off on the day of surgery.  Do not wear lotions, powders, or perfumes.   Do not shave body hair from the neck down 48 hours before surgery.  Contact lenses, hearing aids and dentures may not be worn into surgery.  Do not bring valuables to the hospital. [facility] is not responsible for any missing/lost belongings or valuables.   Notify your doctor if there is any change in your medical condition (cold, fever, infection).  Wear comfortable clothing (specific to your surgery type) to  the hospital.  After surgery, you can help prevent lung complications by doing breathing exercises.  Take deep breaths and cough every 1-2 hours. Your doctor may order a device called an Incentive Spirometer to help you take deep breaths. When coughing or sneezing, hold a pillow firmly against your incision with both hands. This is called "splinting." Doing this helps protect your incision. It  also decreases belly discomfort.  If you are being admitted to the hospital overnight, leave your suitcase in the car. After surgery it may be brought to your room.  In case of increased patient census, it may be necessary for you, the patient, to continue your postoperative care in the Same Day Surgery department.  If you are being discharged the day of surgery, you will not be allowed to drive home. You will need a responsible individual to drive you home and stay with you for 24 hours after surgery.   If you are taking public transportation, you will need to have a responsible individual with you.  Please call the Pre-admissions Testing Dept. at [phone] if you have any questions about these instructions.  Surgery Visitation Policy:  Patients having surgery or a procedure may have two visitors.  Children under the age of 17 must have an adult with them who is not the patient.  Temporary Visitor Restrictions Due to increasing cases of flu, RSV and COVID-19: Children ages 69 and under will not be able to visit patients in [facility] hospitals under most circumstances.  Inpatient Visitation:    Visiting hours are 7 a.m. to 8 p.m. Up to four visitors are allowed at one time in a patient room. The visitors may rotate out with other people during the day.  One visitor age 62 or older may stay with the patient overnight and must be in the room by 8 p.m.    Pre-operative 5 CHG Bath Instructions   You can play a key role in reducing the risk of infection after surgery. Your skin needs to be as free of germs as possible. You can reduce the number of germs on your skin by washing with CHG (chlorhexidine gluconate) soap before surgery. CHG is an antiseptic soap that kills germs and continues to kill germs even after washing.   DO NOT use if you have an allergy to chlorhexidine/CHG or antibacterial soaps. If your skin becomes reddened or irritated, stop using the CHG and notify one of  our RNs at [phone].   Please shower with the CHG soap starting 4 days before surgery using the following schedule:     Please keep in mind the following:  DO NOT shave, including legs and underarms, starting the day of your first shower.   You may shave your face at any point before/day of surgery.  Place clean sheets on your bed the day you start using CHG soap. Use a clean washcloth (not used since being washed) for each shower. DO NOT sleep with pets once you start using the CHG.   CHG Shower Instructions:  If you choose to wash your hair and private area, wash first with your normal shampoo/soap.  After you use shampoo/soap, rinse your hair and body thoroughly to remove shampoo/soap residue.  Turn the water OFF and apply about 3 tablespoons (45 ml) of CHG soap to a CLEAN washcloth.  Apply CHG soap ONLY FROM YOUR NECK DOWN TO YOUR TOES (washing for 3-5 minutes)  DO NOT use CHG soap on face, private areas, open wounds,  or sores.  Pay special attention to the area where your surgery is being performed.  If you are having back surgery, having someone wash your back for you may be helpful. Wait 2 minutes after CHG soap is applied, then you may rinse off the CHG soap.  Pat dry with a clean towel  Put on clean clothes/pajamas   If you choose to wear lotion, please use ONLY the CHG-compatible lotions on the back of this paper.     Additional instructions for the day of surgery: DO NOT APPLY any lotions, deodorants, cologne, or perfumes.   Put on clean/comfortable clothes.  Brush your teeth.  Ask your nurse before applying any prescription medications to the skin.      CHG Compatible Lotions   Aveeno Moisturizing lotion  Cetaphil Moisturizing Cream  Cetaphil Moisturizing Lotion  Clairol Herbal Essence Moisturizing Lotion, Dry Skin  Clairol Herbal Essence Moisturizing Lotion, Extra Dry Skin  Clairol Herbal Essence Moisturizing Lotion, Normal Skin  Curel Age Defying  Therapeutic Moisturizing Lotion with Alpha Hydroxy  Curel Extreme Care Body Lotion  Curel Soothing Hands Moisturizing Hand Lotion  Curel Therapeutic Moisturizing Cream, Fragrance-Free  Curel Therapeutic Moisturizing Lotion, Fragrance-Free  Curel Therapeutic Moisturizing Lotion, Original Formula  Eucerin Daily Replenishing Lotion  Eucerin Dry Skin Therapy Plus Alpha Hydroxy Crme  Eucerin Dry Skin Therapy Plus Alpha Hydroxy Lotion  Eucerin Original Crme  Eucerin Original Lotion  Eucerin Plus Crme Eucerin Plus Lotion  Eucerin TriLipid Replenishing Lotion  Keri Anti-Bacterial Hand Lotion  Keri Deep Conditioning Original Lotion Dry Skin Formula Softly Scented  Keri Deep Conditioning Original Lotion, Fragrance Free Sensitive Skin Formula  Keri Lotion Fast Absorbing Fragrance Free Sensitive Skin Formula  Keri Lotion Fast Absorbing Softly Scented Dry Skin Formula  Keri Original Lotion  Keri Skin Renewal Lotion Keri Silky Smooth Lotion  Keri Silky Smooth Sensitive Skin Lotion  Nivea Body Creamy Conditioning Oil  Nivea Body Extra Enriched Lotion  Nivea Body Original Lotion  Nivea Body Sheer Moisturizing Lotion Nivea Crme  Nivea Skin Firming Lotion  NutraDerm 30 Skin Lotion  NutraDerm Skin Lotion  NutraDerm Therapeutic Skin Cream  NutraDerm Therapeutic Skin Lotion  ProShield Protective Hand Cream  Provon moisturizing lotion  How to Use an Incentive Spirometer An incentive spirometer is a tool that measures how well you are filling your lungs with each breath. Learning to take long, deep breaths using this tool can help you keep your lungs clear and active. This may help to reverse or lessen your chance of developing breathing (pulmonary) problems, especially infection. You may be asked to use a spirometer: After a surgery. If you have a lung problem or a history of smoking. After a long period of time when you have been unable to move or be active. If the spirometer includes an  indicator to show the highest number that you have reached, your health care provider or respiratory therapist will help you set a goal. Keep a log of your progress as told by your health care provider. What are the risks? Breathing too quickly may cause dizziness or cause you to pass out. Take your time so you do not get dizzy or light-headed. If you are in pain, you may need to take pain medicine before doing incentive spirometry. It is harder to take a deep breath if you are having pain. How to use your incentive spirometer  Sit up on the edge of your bed or on a chair. Hold the incentive spirometer so that it  is in an upright position. Before you use the spirometer, breathe out normally. Place the mouthpiece in your mouth. Make sure your lips are closed tightly around it. Breathe in slowly and as deeply as you can through your mouth, causing the piston or the ball to rise toward the top of the chamber. Hold your breath for 3-5 seconds, or for as long as possible. If the spirometer includes a coach indicator, use this to guide you in breathing. Slow down your breathing if the indicator goes above the marked areas. Remove the mouthpiece from your mouth and breathe out normally. The piston or ball will return to the bottom of the chamber. Rest for a few seconds, then repeat the steps 10 or more times. Take your time and take a few normal breaths between deep breaths so that you do not get dizzy or light-headed. Do this every 1-2 hours when you are awake. If the spirometer includes a goal marker to show the highest number you have reached (best effort), use this as a goal to work toward during each repetition. After each set of 10 deep breaths, cough a few times. This will help to make sure that your lungs are clear. If you have an incision on your chest or abdomen from surgery, place a pillow or a rolled-up towel firmly against the incision when you cough. This can help to reduce pain while  taking deep breaths and coughing. General tips When you are able to get out of bed: Walk around often. Continue to take deep breaths and cough in order to clear your lungs. Keep using the incentive spirometer until your health care provider says it is okay to stop using it. If you have been in the hospital, you may be told to keep using the spirometer at home. Contact a health care provider if: You are having difficulty using the spirometer. You have trouble using the spirometer as often as instructed. Your pain medicine is not giving enough relief for you to use the spirometer as told. You have a fever. Get help right away if: You develop shortness of breath. You develop a cough with bloody mucus from the lungs. You have fluid or blood coming from an incision site after you cough. Summary An incentive spirometer is a tool that can help you learn to take long, deep breaths to keep your lungs clear and active. You may be asked to use a spirometer after a surgery, if you have a lung problem or a history of smoking, or if you have been inactive for a long period of time. Use your incentive spirometer as instructed every 1-2 hours while you are awake. If you have an incision on your chest or abdomen, place a pillow or a rolled-up towel firmly against your incision when you cough. This will help to reduce pain. Get help right away if you have shortness of breath, you cough up bloody mucus, or blood comes from your incision when you cough. This information is not intended to replace advice given to you by your health care provider. Make sure you discuss any questions you have with your health care provider.   Preoperative Educational Videos for Total Hip, Knee and Shoulder Replacements  To better prepare for surgery, please view our videos that explain the physical activity and discharge planning required to have the best surgical recovery at [facility].  [email or web address]  Questions? Call [phone] or email [email or web address]

## 2023-04-04 NOTE — Assessment & Plan Note (Addendum)
 UA in office with small leukocytes, microscopic and culture pending. Her history of severe UTI and upcoming surgery increase the risk of complications. Start empirical antibiotic therapy, Macrobid 100 mg BID, pending urine culture results. Advise maintaining adequate hydration and proper perineal hygiene. Monitor symptoms and adjust antibiotics based on culture results if necessary.

## 2023-04-04 NOTE — Progress Notes (Signed)
 Jenny Dicker, NP-C Phone: [phone]  Jenny Roberts is a 78 y.o. female who presents today for UTI symptoms.   Discussed the use of AI scribe software for clinical note transcription with the patient, who gave verbal consent to proceed.  History of Present Illness   Jenny Roberts is a 78 year old female who presents with urinary symptoms.  She experiences burning during urination and discomfort in the lower abdominal area. There is urinary frequency and urgency, with occasional leakage when standing after sitting. No nocturia, hematuria, fever, abnormal discharge, or itching. She reports some suprapubic pressure, which is uncomfortable but not painful.  She has a history of urinary tract infections, including a severe episode last year that required hospitalization. Recently, she was diagnosed with a UTI while preparing for surgery, which was postponed due to COVID-19 and the UTI. Her surgery is now rescheduled for March 27th.  She experiences constant hip pain, which she attributes to her upcoming hip surgery. The pain is significant and impacts her daily life.  Her fluid intake consists mainly of water and coffee, and she keeps water accessible throughout the day, although she is not consciously tracking her intake. She is allergic to amoxicillin and Augmentin. She takes Klonopin at bedtime to aid sleep, as it makes her drowsy during the day.      Social History   Tobacco Use  Smoking Status Never  Smokeless Tobacco Never    Current Outpatient Medications on File Prior to Visit  Medication Sig Dispense Refill   acetaminophen (TYLENOL) 500 MG tablet Take 1,000 mg by mouth every 6 (six) hours as needed (pain.).     alendronate (FOSAMAX) 70 MG tablet Take 1 tablet (70 mg total) by mouth every 7 (seven) days. Take with a full glass of water on an empty stomach. (Patient taking differently: Take 70 mg by mouth every Saturday. Take with a full glass of water on an empty stomach.) 12  tablet 3   Aspirin-Salicylamide-Caffeine (BC HEADACHE POWDER PO) Take 1 packet by mouth 3 (three) times daily as needed (pain.).     Cholecalciferol (QC VITAMIN D3) 50 MCG (2000 UT) CAPS Take 2 capsules by mouth daily.     gabapentin (NEURONTIN) 300 MG capsule Take 1 capsule (300 mg total) by mouth 2 (two) times daily. (Patient taking differently: Take 600 mg by mouth at bedtime.) 60 capsule 3   levothyroxine (SYNTHROID) 88 MCG tablet Take 1 tablet (88 mcg total) by mouth daily. 90 tablet 3   Lidocaine HCl (ICY HOT MAX LIDOCAINE) 4 % LIQD Apply 1 Application topically 3 (three) times daily as needed (pain.).     lovastatin (MEVACOR) 40 MG tablet TAKE 1 TABLET(40 MG) BY MOUTH DAILY (Patient taking differently: Take 40 mg by mouth at bedtime. TAKE 1 TABLET(40 MG) BY MOUTH DAILY) 30 tablet 0   sertraline (ZOLOFT) 100 MG tablet TAKE 1 TABLET BY MOUTH DAILY (Patient taking differently: Take 100 mg by mouth at bedtime.) 90 tablet 1   No current facility-administered medications on file prior to visit.     ROS see history of present illness  Objective  Physical Exam Vitals:   04/04/23 1007  BP: 136/80  Pulse: 77  Temp: 97.8 F (36.6 C)  SpO2: 96%    BP Readings from Last 3 Encounters:  04/04/23 136/80  02/26/23 132/76  02/19/23 132/72   Wt Readings from Last 3 Encounters:  04/04/23 147 lb 12.8 oz (67 kg)  02/26/23 149 lb (67.6 kg)  02/19/23  146 lb (66.2 kg)    Physical Exam Constitutional:      General: She is not in acute distress.    Appearance: Normal appearance.  HENT:     Head: Normocephalic.  Cardiovascular:     Rate and Rhythm: Normal rate and regular rhythm.     Heart sounds: Normal heart sounds.  Pulmonary:     Effort: Pulmonary effort is normal.     Breath sounds: Normal breath sounds.  Abdominal:     General: Abdomen is flat. Bowel sounds are normal.     Palpations: Abdomen is soft.     Tenderness: There is abdominal tenderness in the suprapubic area.   Skin:    General: Skin is warm and dry.  Neurological:     General: No focal deficit present.     Mental Status: She is alert.  Psychiatric:        Mood and Affect: Mood normal.        Behavior: Behavior normal.    Assessment/Plan: Please see individual problem list.  Dysuria Assessment & Plan: UA in office with small leukocytes, microscopic and culture pending. Her history of severe UTI and upcoming surgery increase the risk of complications. Start empirical antibiotic therapy, Macrobid 100 mg BID, pending urine culture results. Advise maintaining adequate hydration and proper perineal hygiene. Monitor symptoms and adjust antibiotics based on culture results if necessary.   Orders: -     POCT Urinalysis Dipstick (Automated) -     Urinalysis, Routine w reflex microscopic -     Urine Culture -     Nitrofurantoin Monohyd Macro; Take 1 capsule (100 mg total) by mouth 2 (two) times daily.  Dispense: 10 capsule; Refill: 0  Right hip pain Assessment & Plan: She experiences constant hip pain. Total hip replacement surgery is scheduled for March 27th.   Anxiety state Assessment & Plan: Klonopin prescribed for bedtime use improves sleep but causes daytime drowsiness if taken during the day. Refill Klonopin 0.5 mg for bedtime use as needed.   Orders: -     clonazePAM; Take 1 tablet (0.5 mg total) by mouth at bedtime.  Dispense: 30 tablet; Refill: 2  Sleeping difficulty -     clonazePAM; Take 1 tablet (0.5 mg total) by mouth at bedtime.  Dispense: 30 tablet; Refill: 2    Return in 26 days (on 04/30/2023), or if symptoms worsen or fail to improve, for TOC as scheduled.   Jenny Dicker, NP-C [facility]

## 2023-04-04 NOTE — Assessment & Plan Note (Signed)
 She experiences constant hip pain. Total hip replacement surgery is scheduled for March 27th.

## 2023-04-04 NOTE — Assessment & Plan Note (Signed)
 Klonopin prescribed for bedtime use improves sleep but causes daytime drowsiness if taken during the day. Refill Klonopin 0.5 mg for bedtime use as needed.

## 2023-04-05 LAB — URINE CULTURE
MICRO NUMBER:: 16203623
Result:: NO GROWTH
SPECIMEN QUALITY:: ADEQUATE

## 2023-04-07 ENCOUNTER — Encounter
Admission: RE | Admit: 2023-04-07 | Discharge: 2023-04-07 | Disposition: A | Discharge status: Home / Self Care | Source: Ambulatory Visit | Attending: Orthopedic Surgery | Admitting: Orthopedic Surgery

## 2023-04-07 ENCOUNTER — Telehealth: Payer: Self-pay

## 2023-04-07 DIAGNOSIS — Z01818 Encounter for other preprocedural examination: Secondary | ICD-10-CM

## 2023-04-07 DIAGNOSIS — Z01812 Encounter for preprocedural laboratory examination: Secondary | ICD-10-CM | POA: Insufficient documentation

## 2023-04-07 LAB — TYPE AND SCREEN
ABO/RH(D): O POS
Antibody Screen: NEGATIVE

## 2023-04-07 NOTE — Telephone Encounter (Signed)
 Copied from CRM [id number]. Topic: Clinical - Lab/Test Results >> Apr 07, 2023 10:20 AM Jenny Roberts wrote: Reason for CRM: Patient called regarding missed call.Provided lab results per request. Patient stated she is feeling good and wants to make sure her lab results are sent over for her surgery.

## 2023-04-16 MED ORDER — CEFAZOLIN SODIUM-DEXTROSE 2-4 GM/100ML-% IV SOLN
2.0000 g | INTRAVENOUS | Status: AC
  Administered 2023-04-17: 2 g via INTRAVENOUS

## 2023-04-16 MED ORDER — ORAL CARE MOUTH RINSE: 15.0000 mL | Freq: Once | OROMUCOSAL | Status: AC

## 2023-04-16 MED ORDER — DEXAMETHASONE SODIUM PHOSPHATE 10 MG/ML IJ SOLN
8.0000 mg | Freq: Once | INTRAMUSCULAR | Status: AC
Start: 2023-04-16 — End: 2023-04-17
  Administered 2023-04-17: 8 mg via INTRAVENOUS

## 2023-04-16 MED ORDER — LACTATED RINGERS IV SOLN: INTRAVENOUS | Status: DC

## 2023-04-16 MED ORDER — CHLORHEXIDINE GLUCONATE 0.12 % MT SOLN
15.0000 mL | Freq: Once | OROMUCOSAL | Status: AC
  Administered 2023-04-17: 15 mL via OROMUCOSAL

## 2023-04-16 MED ORDER — TRANEXAMIC ACID-NACL 1000-0.7 MG/100ML-% IV SOLN
1000.0000 mg | INTRAVENOUS | Status: AC
  Administered 2023-04-17 (×2): 1000 mg via INTRAVENOUS

## 2023-04-17 ENCOUNTER — Ambulatory Visit

## 2023-04-17 ENCOUNTER — Ambulatory Visit: Admitting: General Practice

## 2023-04-17 ENCOUNTER — Encounter
Admission: RE | Disposition: A | Discharge status: Home Health | Payer: Self-pay | Source: Ambulatory Visit | Attending: Orthopedic Surgery

## 2023-04-17 ENCOUNTER — Ambulatory Visit
Admission: RE | Admit: 2023-04-17 | Discharge: 2023-04-18 | Disposition: A | Discharge status: Home Health | Source: Ambulatory Visit | Attending: Orthopedic Surgery | Admitting: Orthopedic Surgery

## 2023-04-17 ENCOUNTER — Other Ambulatory Visit: Payer: Self-pay

## 2023-04-17 ENCOUNTER — Encounter: Payer: Self-pay | Admitting: Orthopedic Surgery

## 2023-04-17 DIAGNOSIS — E039 Hypothyroidism, unspecified: Secondary | ICD-10-CM | POA: Insufficient documentation

## 2023-04-17 DIAGNOSIS — Z9221 Personal history of antineoplastic chemotherapy: Secondary | ICD-10-CM | POA: Diagnosis not present

## 2023-04-17 DIAGNOSIS — R7881 Bacteremia: Secondary | ICD-10-CM | POA: Diagnosis not present

## 2023-04-17 DIAGNOSIS — M1611 Unilateral primary osteoarthritis, right hip: Secondary | ICD-10-CM | POA: Insufficient documentation

## 2023-04-17 DIAGNOSIS — Z853 Personal history of malignant neoplasm of breast: Secondary | ICD-10-CM | POA: Insufficient documentation

## 2023-04-17 DIAGNOSIS — Z79890 Hormone replacement therapy (postmenopausal): Secondary | ICD-10-CM | POA: Insufficient documentation

## 2023-04-17 DIAGNOSIS — E785 Hyperlipidemia, unspecified: Secondary | ICD-10-CM | POA: Diagnosis not present

## 2023-04-17 DIAGNOSIS — Z85828 Personal history of other malignant neoplasm of skin: Secondary | ICD-10-CM | POA: Diagnosis not present

## 2023-04-17 DIAGNOSIS — Z96641 Presence of right artificial hip joint: Secondary | ICD-10-CM | POA: Diagnosis not present

## 2023-04-17 DIAGNOSIS — M25551 Pain in right hip: Secondary | ICD-10-CM | POA: Diagnosis not present

## 2023-04-17 DIAGNOSIS — N10 Acute tubulo-interstitial nephritis: Secondary | ICD-10-CM | POA: Diagnosis not present

## 2023-04-17 DIAGNOSIS — F418 Other specified anxiety disorders: Secondary | ICD-10-CM | POA: Diagnosis not present

## 2023-04-17 SURGERY — ARTHROPLASTY, HIP, TOTAL,POSTERIOR APPROACH
Anesthesia: Spinal | Site: Hip | Laterality: Right

## 2023-04-17 MED ORDER — DEXAMETHASONE SODIUM PHOSPHATE 10 MG/ML IJ SOLN
INTRAMUSCULAR | Status: AC
  Filled 2023-04-17: qty 1

## 2023-04-17 MED ORDER — ONDANSETRON HCL 4 MG/2ML IJ SOLN
INTRAMUSCULAR | Status: AC
  Filled 2023-04-17: qty 2

## 2023-04-17 MED ORDER — MIDAZOLAM HCL 2 MG/2ML IJ SOLN
INTRAMUSCULAR | Status: AC
  Filled 2023-04-17: qty 2

## 2023-04-17 MED ORDER — PROPOFOL 1000 MG/100ML IV EMUL
INTRAVENOUS | Status: AC
  Filled 2023-04-17: qty 100

## 2023-04-17 MED ORDER — CLONAZEPAM 0.5 MG PO TABS
ORAL_TABLET | ORAL | Status: AC
Start: 2023-04-17 — End: ?
  Filled 2023-04-17: qty 1

## 2023-04-17 MED ORDER — KETOROLAC TROMETHAMINE 0.5 % OP SOLN
1.0000 [drp] | Freq: Four times a day (QID) | OPHTHALMIC | Status: DC
  Administered 2023-04-17 (×2): 1 [drp] via OPHTHALMIC
  Filled 2023-04-17 (×2): qty 3

## 2023-04-17 MED ORDER — SERTRALINE HCL 50 MG PO TABS
100.0000 mg | ORAL_TABLET | Freq: Every day | ORAL | Status: DC
  Administered 2023-04-17: 100 mg via ORAL
  Filled 2023-04-17: qty 2

## 2023-04-17 MED ORDER — PRAVASTATIN SODIUM 20 MG PO TABS
40.0000 mg | ORAL_TABLET | Freq: Every day | ORAL | Status: DC
  Administered 2023-04-17: 40 mg via ORAL
  Filled 2023-04-17: qty 2

## 2023-04-17 MED ORDER — OXYCODONE HCL 5 MG PO TABS
5.0000 mg | ORAL_TABLET | Freq: Once | ORAL | Status: AC | PRN
  Administered 2023-04-17: 5 mg via ORAL

## 2023-04-17 MED ORDER — TRAMADOL HCL 50 MG PO TABS
50.0000 mg | ORAL_TABLET | Freq: Four times a day (QID) | ORAL | Status: DC | PRN
  Administered 2023-04-17: 50 mg via ORAL
  Filled 2023-04-17: qty 1

## 2023-04-17 MED ORDER — SURGIPHOR WOUND IRRIGATION SYSTEM - OPTIME
TOPICAL | Status: DC | PRN
Start: 2023-04-17 — End: 2023-04-17

## 2023-04-17 MED ORDER — FENTANYL CITRATE (PF) 100 MCG/2ML IJ SOLN
25.0000 ug | INTRAMUSCULAR | Status: DC | PRN
  Administered 2023-04-17: 25 ug via INTRAVENOUS

## 2023-04-17 MED ORDER — LEVOTHYROXINE SODIUM 88 MCG PO TABS
88.0000 ug | ORAL_TABLET | Freq: Every day | ORAL | Status: DC
  Administered 2023-04-18: 88 ug via ORAL
  Filled 2023-04-17: qty 1

## 2023-04-17 MED ORDER — SODIUM CHLORIDE (PF) 0.9 % IJ SOLN
INTRAMUSCULAR | Status: AC
  Filled 2023-04-17: qty 10

## 2023-04-17 MED ORDER — CEFAZOLIN SODIUM-DEXTROSE 2-4 GM/100ML-% IV SOLN
2.0000 g | Freq: Four times a day (QID) | INTRAVENOUS | Status: AC
  Administered 2023-04-17 (×2): 2 g via INTRAVENOUS
  Filled 2023-04-17 (×2): qty 100

## 2023-04-17 MED ORDER — ACETAMINOPHEN 325 MG PO TABS
325.0000 mg | ORAL_TABLET | Freq: Four times a day (QID) | ORAL | Status: DC | PRN
  Administered 2023-04-17: 650 mg via ORAL

## 2023-04-17 MED ORDER — DOCUSATE SODIUM 100 MG PO CAPS
100.0000 mg | ORAL_CAPSULE | Freq: Two times a day (BID) | ORAL | Status: DC
  Administered 2023-04-17 – 2023-04-18 (×3): 100 mg via ORAL
  Filled 2023-04-17 (×3): qty 1

## 2023-04-17 MED ORDER — GABAPENTIN 300 MG PO CAPS
ORAL_CAPSULE | ORAL | Status: AC
  Filled 2023-04-17: qty 2

## 2023-04-17 MED ORDER — BUPIVACAINE HCL (PF) 0.5 % IJ SOLN
INTRAMUSCULAR | Status: DC | PRN
  Administered 2023-04-17: 2.6 mg

## 2023-04-17 MED ORDER — CLONAZEPAM 0.5 MG PO TABS
0.5000 mg | ORAL_TABLET | Freq: Every day | ORAL | Status: DC
  Administered 2023-04-17: 0.5 mg via ORAL

## 2023-04-17 MED ORDER — METOCLOPRAMIDE HCL 10 MG PO TABS: 5.0000 mg | ORAL_TABLET | Freq: Three times a day (TID) | ORAL | Status: DC | PRN

## 2023-04-17 MED ORDER — ONDANSETRON HCL 4 MG/2ML IJ SOLN
INTRAMUSCULAR | Status: DC | PRN
  Administered 2023-04-17: 4 mg via INTRAVENOUS

## 2023-04-17 MED ORDER — HYDROCODONE-ACETAMINOPHEN 5-325 MG PO TABS
1.0000 | ORAL_TABLET | ORAL | Status: DC | PRN
  Administered 2023-04-17 – 2023-04-18 (×2): 1 via ORAL
  Filled 2023-04-17: qty 1

## 2023-04-17 MED ORDER — ONDANSETRON HCL 4 MG/2ML IJ SOLN: 4.0000 mg | Freq: Four times a day (QID) | INTRAMUSCULAR | Status: DC | PRN

## 2023-04-17 MED ORDER — PROPOFOL 10 MG/ML IV BOLUS
INTRAVENOUS | Status: AC
  Filled 2023-04-17: qty 20

## 2023-04-17 MED ORDER — OXYCODONE HCL 5 MG/5ML PO SOLN: 5.0000 mg | Freq: Once | ORAL | Status: AC | PRN

## 2023-04-17 MED ORDER — TRANEXAMIC ACID-NACL 1000-0.7 MG/100ML-% IV SOLN
INTRAVENOUS | Status: AC
  Filled 2023-04-17: qty 100

## 2023-04-17 MED ORDER — GABAPENTIN 300 MG PO CAPS
ORAL_CAPSULE | ORAL | Status: AC
  Filled 2023-04-17: qty 1

## 2023-04-17 MED ORDER — PANTOPRAZOLE SODIUM 40 MG PO TBEC
40.0000 mg | DELAYED_RELEASE_TABLET | Freq: Every day | ORAL | Status: DC
  Administered 2023-04-17 – 2023-04-18 (×2): 40 mg via ORAL
  Filled 2023-04-17 (×2): qty 1

## 2023-04-17 MED ORDER — BUPIVACAINE LIPOSOME 1.3 % IJ SUSP
INTRAMUSCULAR | Status: AC
  Filled 2023-04-17: qty 20

## 2023-04-17 MED ORDER — SODIUM CHLORIDE 0.9 % IR SOLN
Status: DC | PRN
  Administered 2023-04-17: 3000 mL

## 2023-04-17 MED ORDER — MORPHINE SULFATE (PF) 2 MG/ML IV SOLN: 0.5000 mg | INTRAVENOUS | Status: DC | PRN

## 2023-04-17 MED ORDER — BUPIVACAINE-EPINEPHRINE (PF) 0.25% -1:200000 IJ SOLN
INTRAMUSCULAR | Status: AC
  Filled 2023-04-17: qty 30

## 2023-04-17 MED ORDER — ONDANSETRON HCL 4 MG PO TABS: 4.0000 mg | ORAL_TABLET | Freq: Four times a day (QID) | ORAL | Status: DC | PRN

## 2023-04-17 MED ORDER — PHENYLEPHRINE HCL-NACL 20-0.9 MG/250ML-% IV SOLN
INTRAVENOUS | Status: AC
  Filled 2023-04-17: qty 250

## 2023-04-17 MED ORDER — BSS IO SOLN
15.0000 mL | Freq: Once | INTRAOCULAR | Status: AC
  Administered 2023-04-17: 15 mL
  Filled 2023-04-17: qty 15

## 2023-04-17 MED ORDER — ACETAMINOPHEN 325 MG PO TABS
ORAL_TABLET | ORAL | Status: AC
  Filled 2023-04-17: qty 2

## 2023-04-17 MED ORDER — PHENOL 1.4 % MT LIQD: 1.0000 | OROMUCOSAL | Status: DC | PRN

## 2023-04-17 MED ORDER — FENTANYL CITRATE (PF) 100 MCG/2ML IJ SOLN
INTRAMUSCULAR | Status: AC
  Filled 2023-04-17: qty 2

## 2023-04-17 MED ORDER — METOCLOPRAMIDE HCL 5 MG/ML IJ SOLN: 5.0000 mg | Freq: Three times a day (TID) | INTRAMUSCULAR | Status: DC | PRN

## 2023-04-17 MED ORDER — KETOROLAC TROMETHAMINE 15 MG/ML IJ SOLN
7.5000 mg | Freq: Four times a day (QID) | INTRAMUSCULAR | Status: AC
  Administered 2023-04-17 – 2023-04-18 (×4): 7.5 mg via INTRAVENOUS
  Filled 2023-04-17 (×2): qty 1

## 2023-04-17 MED ORDER — PHENYLEPHRINE HCL-NACL 20-0.9 MG/250ML-% IV SOLN
INTRAVENOUS | Status: DC | PRN
  Administered 2023-04-17: 20 ug/min via INTRAVENOUS

## 2023-04-17 MED ORDER — CELECOXIB 200 MG PO CAPS
ORAL_CAPSULE | ORAL | Status: AC
  Filled 2023-04-17: qty 2

## 2023-04-17 MED ORDER — ENOXAPARIN SODIUM 40 MG/0.4ML IJ SOSY
40.0000 mg | PREFILLED_SYRINGE | INTRAMUSCULAR | Status: DC
Start: 2023-04-18 — End: 2023-04-18
  Administered 2023-04-18: 40 mg via SUBCUTANEOUS
  Filled 2023-04-17: qty 0.4

## 2023-04-17 MED ORDER — OXYCODONE HCL 5 MG PO TABS
ORAL_TABLET | ORAL | Status: AC
  Filled 2023-04-17: qty 1

## 2023-04-17 MED ORDER — SODIUM CHLORIDE 0.9 % IV SOLN: INTRAVENOUS | Status: DC

## 2023-04-17 MED ORDER — KETOROLAC TROMETHAMINE 15 MG/ML IJ SOLN
INTRAMUSCULAR | Status: AC
  Filled 2023-04-17: qty 1

## 2023-04-17 MED ORDER — CHLORHEXIDINE GLUCONATE 0.12 % MT SOLN
OROMUCOSAL | Status: AC
  Filled 2023-04-17: qty 15

## 2023-04-17 MED ORDER — ACETAMINOPHEN 500 MG PO TABS
1000.0000 mg | ORAL_TABLET | Freq: Three times a day (TID) | ORAL | Status: DC
  Administered 2023-04-17 – 2023-04-18 (×2): 1000 mg via ORAL
  Filled 2023-04-17 (×3): qty 2

## 2023-04-17 MED ORDER — PROPOFOL 500 MG/50ML IV EMUL
INTRAVENOUS | Status: DC | PRN
  Administered 2023-04-17: 100 ug/kg/min via INTRAVENOUS

## 2023-04-17 MED ORDER — HYDROCODONE-ACETAMINOPHEN 5-325 MG PO TABS
ORAL_TABLET | ORAL | Status: AC
  Filled 2023-04-17: qty 1

## 2023-04-17 MED ORDER — MENTHOL 3 MG MT LOZG: 1.0000 | LOZENGE | OROMUCOSAL | Status: DC | PRN

## 2023-04-17 MED ORDER — 0.9 % SODIUM CHLORIDE (POUR BTL) OPTIME
TOPICAL | Status: DC | PRN
  Administered 2023-04-17: 500 mL

## 2023-04-17 MED ORDER — SODIUM CHLORIDE (PF) 0.9 % IJ SOLN
INTRAMUSCULAR | Status: DC | PRN
  Administered 2023-04-17: 50 mL

## 2023-04-17 MED ORDER — GABAPENTIN 300 MG PO CAPS
600.0000 mg | ORAL_CAPSULE | Freq: Every day | ORAL | Status: DC
  Administered 2023-04-17: 600 mg via ORAL

## 2023-04-17 MED ORDER — MIDAZOLAM HCL 5 MG/5ML IJ SOLN
INTRAMUSCULAR | Status: DC | PRN
  Administered 2023-04-17: 1 mg via INTRAVENOUS

## 2023-04-17 MED ORDER — PHENYLEPHRINE 80 MCG/ML (10ML) SYRINGE FOR IV PUSH (FOR BLOOD PRESSURE SUPPORT)
PREFILLED_SYRINGE | INTRAVENOUS | Status: DC | PRN
  Administered 2023-04-17: 80 ug via INTRAVENOUS
  Administered 2023-04-17: 160 ug via INTRAVENOUS
  Administered 2023-04-17 (×2): 80 ug via INTRAVENOUS

## 2023-04-17 SURGICAL SUPPLY — 80 items
BLADE SAGITTAL AGGR TOOTH XLG (BLADE) ×1 IMPLANT
BNDG COHESIVE 6X5 TAN ST LF (GAUZE/BANDAGES/DRESSINGS) ×1 IMPLANT
BOWL CEMENT MIX W/ADAPTER (MISCELLANEOUS) IMPLANT
BOWL CEMENT MIXING ADV NOZZLE (MISCELLANEOUS) IMPLANT
BRUSH SCRUB EZ PLAIN DRY (MISCELLANEOUS) ×1 IMPLANT
CEMENT BONE 10-PACK (Cement) IMPLANT
CHLORAPREP W/TINT 26 (MISCELLANEOUS) ×2 IMPLANT
COVER SET STULBERG POSITIONER (MISCELLANEOUS) ×1 IMPLANT
CUP MEDICINE 2OZ PLAST GRAD ST (MISCELLANEOUS) IMPLANT
DERMABOND ADVANCED .7 DNX12 (GAUZE/BANDAGES/DRESSINGS) ×1 IMPLANT
DRAPE IMP U-DRAPE 54X76 (DRAPES) ×1 IMPLANT
DRAPE INCISE IOBAN 66X60 STRL (DRAPES) ×1 IMPLANT
DRAPE POUCH INSTRU U-SHP 10X18 (DRAPES) ×1 IMPLANT
DRAPE SHEET LG 3/4 BI-LAMINATE (DRAPES) ×1 IMPLANT
DRAPE TABLE BACK 80X90 (DRAPES) ×1 IMPLANT
DRAPE U-SHAPE 47X51 STRL (DRAPES) ×1 IMPLANT
DRSG MEPILEX SACRM 8.7X9.8 (GAUZE/BANDAGES/DRESSINGS) ×1 IMPLANT
DRSG OPSITE POSTOP 4X10 (GAUZE/BANDAGES/DRESSINGS) IMPLANT
DRSG OPSITE POSTOP 4X12 (GAUZE/BANDAGES/DRESSINGS) IMPLANT
DRSG OPSITE POSTOP 4X8 (GAUZE/BANDAGES/DRESSINGS) IMPLANT
ELECT REM PT RETURN 9FT ADLT (ELECTROSURGICAL) ×1 IMPLANT
ELECTRODE REM PT RTRN 9FT ADLT (ELECTROSURGICAL) ×1 IMPLANT
GLOVE BIO SURGEON STRL SZ8 (GLOVE) ×1 IMPLANT
GLOVE BIOGEL PI IND STRL 8 (GLOVE) ×1 IMPLANT
GLOVE PI ORTHO PRO STRL 7.5 (GLOVE) ×2 IMPLANT
GLOVE PI ORTHO PRO STRL SZ8 (GLOVE) ×2 IMPLANT
GLOVE SURG SYN 7.5 E (GLOVE) ×2 IMPLANT
GLOVE SURG SYN 7.5 PF PI (GLOVE) ×2 IMPLANT
GOWN SRG XL LVL 3 NONREINFORCE (GOWNS) ×1 IMPLANT
GOWN STRL REUS W/ TWL LRG LVL3 (GOWN DISPOSABLE) ×1 IMPLANT
GOWN STRL REUS W/ TWL XL LVL3 (GOWN DISPOSABLE) ×1 IMPLANT
HANDLE YANKAUER SUCT OPEN TIP (MISCELLANEOUS) ×1 IMPLANT
HEAD HIP LFIT V40 22 (Head) IMPLANT
HIP HD LFIT V40 22 (Head) ×1 IMPLANT
HOOD PEEL AWAY T7 (MISCELLANEOUS) ×2 IMPLANT
INSERT MDM LINER 22.2X38 38D (Insert) IMPLANT
IV NS 100ML SINGLE PACK (IV SOLUTION) ×1 IMPLANT
KIT PREP HIP W/CEMENT RESTRICT (Miscellaneous) IMPLANT
KIT TURNOVER KIT A (KITS) ×1 IMPLANT
LINER CEMENTLESS SZ D 38 HIP (Liner) IMPLANT
MANIFOLD NEPTUNE II (INSTRUMENTS) ×1 IMPLANT
MARKER SKIN DUAL TIP RULER LAB (MISCELLANEOUS) ×1 IMPLANT
MAT ABSORB FLUID 56X50 GRAY (MISCELLANEOUS) ×1 IMPLANT
NDL FILTER BLUNT 18X1 1/2 (NEEDLE) ×1 IMPLANT
NDL SAFETY ECLIPSE 18X1.5 (NEEDLE) ×1 IMPLANT
NDL SPNL 20GX3.5 QUINCKE YW (NEEDLE) ×1 IMPLANT
NEEDLE FILTER BLUNT 18X1 1/2 (NEEDLE) ×1 IMPLANT
NEEDLE SPNL 20GX3.5 QUINCKE YW (NEEDLE) ×1 IMPLANT
PACK HIP PROSTHESIS (MISCELLANEOUS) ×1 IMPLANT
PAD ARMBOARD POSITIONER FOAM (MISCELLANEOUS) ×2 IMPLANT
PENCIL SMOKE EVACUATOR (MISCELLANEOUS) ×1 IMPLANT
PILLOW ABDUCTION FOAM SM (MISCELLANEOUS) ×1 IMPLANT
PULSAVAC PLUS IRRIG FAN TIP (DISPOSABLE) ×1 IMPLANT
RETRIEVER SUT HEWSON (MISCELLANEOUS) ×1 IMPLANT
SCREW HEX LP 6.5X20 (Screw) IMPLANT
SCREW HEX LP 6.5X30 (Screw) IMPLANT
SHELL ACETAB TRIDENT 48 (Shell) IMPLANT
SLEEVE SCD COMPRESS KNEE MED (STOCKING) ×1 IMPLANT
SOL .9 NS 3000ML IRR UROMATIC (IV SOLUTION) ×1 IMPLANT
SOLUTION IRRIG SURGIPHOR (IV SOLUTION) ×1 IMPLANT
SPACER CENTRAL ACCOLADE 4/5X14 (Spacer) IMPLANT
STEM ACCOLADE 4X35X137 132D (Stem) IMPLANT
SUT BONE WAX W31G (SUTURE) ×1 IMPLANT
SUT ETHIBOND #5 BRAIDED 30INL (SUTURE) ×1 IMPLANT
SUT STRATA 1 CT-1 DLB (SUTURE) ×1 IMPLANT
SUT STRATAFIX 14 PDO 36 VLT (SUTURE) ×1 IMPLANT
SUT STRATAFIX PDO 1 14 VIOLET (SUTURE) ×1 IMPLANT
SUT VIC AB 0 CT1 36 (SUTURE) ×1 IMPLANT
SUT VIC AB 2-0 CT2 27 (SUTURE) ×2 IMPLANT
SUT VICRYL 1-0 27IN ABS (SUTURE) ×1 IMPLANT
SUTURE STRATA SPIR 4-0 18 (SUTURE) ×1 IMPLANT
SUTURE VICRYL 1-0 27IN ABS (SUTURE) ×1 IMPLANT
SYR 20ML LL LF (SYRINGE) ×2 IMPLANT
SYR TB 1ML LL NO SAFETY (SYRINGE) ×1 IMPLANT
TIP BRUSH PULSAVAC PLUS 24.33 (MISCELLANEOUS) IMPLANT
TIP FAN IRRIG PULSAVAC PLUS (DISPOSABLE) ×1 IMPLANT
TOWEL OR 17X26 4PK STRL BLUE (TOWEL DISPOSABLE) IMPLANT
TRAP FLUID SMOKE EVACUATOR (MISCELLANEOUS) ×1 IMPLANT
WAND WEREWOLF FASTSEAL 6.0 (MISCELLANEOUS) ×1 IMPLANT
WATER STERILE IRR 1000ML POUR (IV SOLUTION) ×1 IMPLANT

## 2023-04-17 NOTE — Anesthesia Procedure Notes (Signed)
 Spinal  Patient location during procedure: OR Start time: 04/17/2023 7:35 AM End time: 04/17/2023 7:40 AM Reason for block: surgical anesthesia Staffing Performed: anesthesiologist  Anesthesiologist: Stephanie Coup, MD Performed by: Stephanie Coup, MD Authorized by: Stephanie Coup, MD   Preanesthetic Checklist Completed: patient identified, IV checked, site marked, risks and benefits discussed, surgical consent, monitors and equipment checked, pre-op evaluation and timeout performed Spinal Block Patient position: sitting Prep: Betadine Patient monitoring: heart rate, continuous pulse ox, blood pressure and cardiac monitor Approach: midline Location: L4-5 Injection technique: single-shot Needle Needle type: Whitacre and Introducer  Needle gauge: 22 G Needle length: 9 cm Assessment Sensory level: T4 Events: CSF return

## 2023-04-17 NOTE — Op Note (Signed)
 Patient Name: Jenny Roberts  WGN:[id number]  Pre-Operative Diagnosis: Right hip Osteoarthritis  Post-Operative Diagnosis: (same)  Procedure: Right Total Hip Arthroplasty  Components/Implants: Cup: Trident Tritanium Clusterhole 48/D w/x2 screws    Liner: MDM  38/D  Stem: Accolade C #4 x 132 de with 14mm distal spacer  Head:LFIT 22.2 +60mm inner ball, MDM X3 22.2/38D outer ball  Date of Surgery: 04/17/2023  Surgeon: Reinaldo Berber MD  Assistant: Amador Cunas PA (present and scrubbed throughout the case, critical for assistance with exposure, retraction, instrumentation, and closure)   Anesthesiologist: Lorette Ang  Anesthesia: Spinal   IVF:700cc  EBL: 200cc  Complications: None   Brief history: The patient is a 78 year old female with a history of osteoarthritis of the right hip with pain limiting their range of motion and activities of daily living, which has failed multiple attempts at conservative therapy.  The risks and benefits of total hip arthroplasty as definitive surgical treatment were discussed with the patient, who opted to proceed with the operation.  After outpatient medical clearance and optimization was completed the patient was admitted to [facility] for the procedure.  All preoperative films were reviewed and an appropriate surgical plan was made prior to surgery.   Description of procedure: The patient was brought to the operating room where laterality was confirmed by all those present to be the right side.  The patient was moved to the table and administered spinal anesthesia. Patient was given an intravenous dose of antibiotics for surgical prophylaxis and TXA. The patient was positioned in lateral decubitus position with all bony prominences well-padded.  Surgical site was prepped with alcohol and chlorhexidine.  Surgical site over the hip was draped in typical sterile fashion with multiple layers of adhesive and nonadhesive drapes.  The incision  site over the greater trochanter posteriorly was marked out with a sterile marker.   Surgical timeout was then called with participation of all staff in the room the patient was confirmed and laterality again confirmed.  An incision was made over the lateral aspect of the hip cheating posteriorly on the proximal aspect.  Careful soft tissue dissection and coagulation of all bleeders was carried out down to the level of the glut max fascia.  The fascia was carefully incised in line with the femur.  A Charnley retractor was placed deep to the fascia with care taken to ensure that there was no nerve entrapment in the retractor.  The bursa tissue was taken down over the posterior femur exposing the external rotators.  A dull Cobra retractor was placed under the abductor mechanism to protect the mechanism and fully expose the piriformis and short external rotators.  The external rotators were carefully detached from the femur with electrocautery and tagged with Ethibond sutures.  The capsule to the hip was incised and tagged with sutures.  The femur was then carefully dislocated and cobra retractors were placed superior and inferior to the femoral neck.  The x-ray template was reviewed and electrocautery was used to mark out the femoral neck resection.   After the femoral neck and head were resected attention was then turned to the acetabulum.  An anterior acetabular retractor was placed and posterior retractor was placed to fully visualize the acetabulum.  The labrum was removed with electrocautery and the pulnivar was removed.  The acetabulum was then reamed sequentially up to a size 48 cup which allowed for good bony coverage and stability.  The size 48 acetabular component was then opened and implanted.  A  drill was used to carefully place the 2 screws into the acetabular component.  A real MDM liner was then placed impacted and checked for stability.  Attention was turned back to the femur and a femoral neck  retractor was placed.  The femur was opened with a box osteotome and canal finder.  The femur was then sequentially broached up to a size 4 broach which allowed for good fit and fill.  A calcar planer was then used to smooth out the calcar.  A trial neck and head were then attached and the hip was reduced.  The hip was found to be stable on reduction with full range of motion without subluxation or dislocation and leg lengths felt equal.  The hip was then carefully dislocated head and neck trial was removed.   The femur was then prepped for cement and anesthesia was alerted to the plan to use cement in the femur.  A lap was placed within the acetabulum to prevent any cement from getting into the cup.  A distal cement restrictor was placed the canal was brushed and irrigated with copious saline and then dried with a suction device.  The canal was then carefully filled with cement and pressurized.  The implant was tamped into place and held appropriate version all excess cement was removed.  The implant was held in place until the cement fully cured. The real ball was then placed on the femoral component.  The acetabulum was irrigated and the hip was reduced.  The hip showed good range of motion and stability on testing with both stability in flexion internal rotation and extension external rotation.  The hip was then irrigated with betadine based surgiphor solution and pulsatile lavage with saline.  A 2 mm drill bit was then used to make 2 holes in the greater trochanter the piriformis and capsular tissues were reapproximated and passed through the drill holes and tied over the greater trochanter.  The fascia was then approximated with #1 Vicryl and #2 barbed suture.  The subcutaneous tissues and skin were closed with 0 Vicryl 2-0 Vicryl and 4-0 barbed monofilament suture and the skin closed with Dermabond.  A sterile dressing was then applied.  Lap, sharps, and sponge counts were correct at the end of the case.    The patient was then rolled supine and an x-ray was taken in the operating room. Leg lengths were clinically equal on examination with a good distal pulse. Components appeared in good position with no fractures noted on x-ray.  Patient was then transferred to a hospital bed and transferred to the recovery room in stable condition.

## 2023-04-17 NOTE — H&P (Signed)
 History of Present Illness: Jenny Roberts is an 78 y.o. female presents for history and physical for right posterior total hip arthroplasty with Dr. Audelia Acton on 04/17/2023. Patient's pain is 7 out of 10. She has pain in her groin down to the medial thigh worse with weightbearing activity. She reports she got about 3 weeks of good relief of her pain after her hip injection and then the pain came back. She takes Tylenol as needed for the pain with some relief and gabapentin. She reports her back pain has been under control from ESI injections and the hip is now the primary issue. She is here today to discuss a more permanent solution for her hip as it has become a functional limitation in her life. The patient denies fevers, chills, numbness, tingling, shortness of breath, chest pain, recent illness, or any trauma.  Patient is a non-smoker, nondiabetic with a BMI of 26.7  Past Medical History: Past Medical History:  Diagnosis Date  Breast cancer (CMS/HHS-HCC) 2001  Chickenpox  Depression  Hyperlipidemia  Hypothyroidism  Seasonal allergies   Past Surgical History: Past Surgical History:  Procedure Laterality Date  MASTECTOMY PARTIAL / LUMPECTOMY Right 2001  Left Patella Pinning Left 2005  HYSTERECTOMY VAGINAL  Right Wrist ORIF Right  Skin Cancer removed from Face  Wisdom Teeth Removed   Past Family History: Family History  Problem Relation Age of Onset  No Known Problems Mother  Brain cancer Father   Medications: Current Outpatient Medications  Medication Sig Dispense Refill  acetaminophen (TYLENOL) 500 MG tablet Take 1,000 mg by mouth as needed for Pain.  alendronate (FOSAMAX) 70 MG tablet Take 1 tablet (70 mg total) by mouth every 7 (seven) days. Take with a full glass of water on an empty stomach.  cholecalciferol (VITAMIN D3) 1,000 unit tablet Take by mouth  clonazePAM (KLONOPIN) 0.5 MG tablet Take 1 tablet by mouth once daily.  famotidine (PEPCID) 20 MG tablet Take by mouth Take  20 mg by mouth 3 times/day as needed-between meals & bedtime for heartburn or indigestion.  gabapentin (NEURONTIN) 300 MG capsule TAKE 2 CAPSULES BY MOUTH EVERY NIGHT 60 capsule 2  levothyroxine (SYNTHROID, LEVOTHROID) 88 MCG tablet Take 1 tablet by mouth once daily.  lovastatin (MEVACOR) 40 MG tablet Take 1 tablet by mouth once daily.  sertraline (ZOLOFT) 100 MG tablet Take 1 tablet by mouth once daily.   No current facility-administered medications for this visit.   Allergies: Allergies  Allergen Reactions  Amoxicillin-Pot Clavulanate Diarrhea and Other (See Comments)  Has patient had a PCN reaction causing immediate rash, facial/tongue/throat swelling, SOB or lightheadedness with hypotension: No Has patient had a PCN reaction causing severe rash involving mucus membranes or skin necrosis: No Has patient had a PCN reaction that required hospitalization No Has patient had a PCN reaction occurring within the last 10 years: Yes If all of the above answers are "NO", then may proceed with Cephalosporin use.  Codeine Hives and Other (See Comments)  shakes shakes  Latex Itching and Rash    Visit Vitals: Vitals:  04/02/23 1433  BP: 124/82    Review of Systems:  A comprehensive 14 point ROS was performed, reviewed, and the pertinent orthopaedic findings are documented in the HPI.  Physical Exam: Body mass index is 27.25 kg/m. General:  Well developed, well nourished, no apparent distress, normal affect, antalgic gait  HEENT: Head normocephalic, atraumatic, PERRL.   Abdomen: Soft, non tender, non distended, Bowel sounds present.  Heart: Examination of the heart reveals  regular, rate, and rhythm. There is no murmur noted on ascultation. There is a normal apical pulse.  Lungs: Lungs are clear to auscultation. There is no wheeze, rhonchi, or crackles. There is normal expansion of bilateral chest walls.   Right hip exam  SKIN: intact SWELLING: none WARMTH: no  warmth TENDERNESS: Moderate tenderness over the lateral aspect the greater trochanter, Stinchfield Positive ROM: 10 degrees internal rotation and 20 degrees external rotation and pain with internal rotation localized to the anterior thigh and groin and reports that this is the pain she is having now,; Hip Flexion 95 STRENGTH: limited by pain GAIT: stiff-legged STABILITY: stable to testing CREPITUS: no LEG LENGTH DISCREPANCY: none NEUROLOGICAL EXAM: normal VASCULAR EXAM: normal LUMBAR SPINE: tenderness: yes mild paraspinal on the right straight leg raising sign: no motor exam: normal  The contralateral hip was examined for comparison and it showed: TENDERNESS: none ROM: normal and full STRENGTH: normal STABILITY: stable to testing  Hip Imaging :  I have reviewed AP pelvis and lateral hip X-rays (2 views) taken today in the office of the right hip which reveal bilateral hip degenerative changes moderate to severe arthritis in both hips. With joint space narrowing sclerosis and osteophyte formation worse in the right hip than the left. There is also some calcifications over the lateral aspect of both greater trochanters. No fractures or dislocations noted about the hips or pelvis grossly unchanged from prior films earlier this year. 2 metallic devices noted in the superior aspect of the films consistent with clothing.   Assessment:  Right hip osteoarthritis  Plan: Jenny Roberts is a 78 year old female presents with right hip bone on bone arthritis. Patient has had increasing pain and limitations of activities of daily living and significant reduction in quality of life due to pain associated with her right hip osteoarthritis. Pain has been moderate to severe and increasing despite conservative treatment. Risks, benefits, complications of a right posterior cemented total hip arthroplasty have been discussed with the patient and patient has agreed and consented to the procedure with Dr. Audelia Acton on  04/17/2023.  The hospitalization and post-operative care and rehabilitation were also discussed. The use of perioperative antibiotics and DVT prophylaxis were discussed. The risk, benefits and alternatives to a surgical intervention were discussed at length with the patient. The patient was also advised of risks related to the medical comorbidities and elevated body mass index (BMI). A lengthy discussion took place to review the most common complications including but not limited to: deep vein thrombosis, pulmonary embolus, heart attack, stroke, infection, wound breakdown, heterotopic ossification, dislocation, numbness, leg length in-equality, intraoperative fracture, damage to nerves, tendon,muscles, arteries or other blood vessels, death and other possible complications from anesthesia. The patient was told that we will take steps to minimize these risks by using sterile technique, antibiotics and DVT prophylaxis when appropriate and follow the patient postoperatively in the office setting to monitor progress. The possibility of recurrent pain, no improvement in pain and actual worsening of pain were also discussed with the patient. The risk of dislocation following total hip replacement was discussed and potential precautions to prevent dislocation were reviewed.   All questions answered patient agrees with above plan for right posterior cemented total hip arthroplasty. We specifically discussed the risks and benefits of cement use.

## 2023-04-17 NOTE — Progress Notes (Cosign Needed)
 Patient is not able to walk the distance required to go the bathroom, or he/she is unable to safely negotiate stairs required to access the bathroom.  A 3in1 BSC will alleviate this problem

## 2023-04-17 NOTE — Anesthesia Preprocedure Evaluation (Signed)
 Anesthesia Evaluation  Patient identified by MRN, date of birth, ID band Patient awake    Reviewed: Allergy & Precautions, NPO status , Patient's Chart, lab work & pertinent test results  History of Anesthesia Complications (+) PONV and history of anesthetic complications  Airway Mallampati: III  TM Distance: <3 FB Neck ROM: full    Dental  (+) Teeth Intact, Caps, Chipped   Pulmonary neg pulmonary ROS   Pulmonary exam normal        Cardiovascular negative cardio ROS Normal cardiovascular exam     Neuro/Psych  PSYCHIATRIC DISORDERS Anxiety Depression    negative neurological ROS  negative psych ROS   GI/Hepatic negative GI ROS, Neg liver ROS,,,  Endo/Other  negative endocrine ROSHypothyroidism    Renal/GU      Musculoskeletal   Abdominal   Peds  Hematology negative hematology ROS (+)   Anesthesia Other Findings Past Medical History: No date: Age related osteoporosis No date: Anxiety 2001: Breast cancer (HCC)     Comment:  Right. Treated with lumpectomy followed by Chemotherapy               and Radiation therapy 03/06/2023: COVID-19 No date: DDD (degenerative disc disease), lumbar No date: Depression No date: Hyperlipidemia No date: Hypothyroidism No date: Lumbar stenosis with neurogenic claudication 06/15/2014: Nontraumatic rupture of tendon of thumb No date: Personal history of chemotherapy No date: Personal history of radiation therapy No date: PONV (postoperative nausea and vomiting) No date: Recurrent UTI 05/22/2020: Right forearm fracture 04/2022: Sepsis secondary to UTI (HCC) 09/2004: Skin cancer     Comment:  treated at [facility] No date: Thrombocytopenia (HCC) No date: Vitamin D deficiency  Past Surgical History: 1983: ABDOMINAL HYSTERECTOMY 2001: BREAST BIOPSY; Right     Comment:  positive 1990's: BREAST EXCISIONAL BIOPSY; Right     Comment:  benign 08/16/1999: BREAST LUMPECTOMY; Right      Comment:  Metaplastic carcinoma, grade 3; 1.7 cm, T1c, N0; ER/PR               less than 10%, 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  Focal positive               posterior margin.  0/5 lymph nodes. 2016: CARPAL TUNNEL RELEASE; Right 2024: CATARACT EXTRACTION W/ INTRAOCULAR LENS  IMPLANT, BILATERAL;  Bilateral 2008: COLONOSCOPY 02/16/2018: COLONOSCOPY WITH PROPOFOL; N/A     Comment:  Procedure: COLONOSCOPY WITH PROPOFOL;  Surgeon: Wyline Mood, MD;  Location: [facility] ENDOSCOPY;  Service:               Endoscopy;  Laterality: N/A; No date: HARDWARE REMOVAL; Right     Comment:  plates removed from wrist and put in thumb screw 2006: MOHS SURGERY; Right     Comment:  Basal cell under right eye 2003: PATELLA FRACTURE SURGERY; Left     Comment:  wires in place 2010: WRIST FRACTURE SURGERY; Right 2022: WRIST FRACTURE SURGERY; Right     Comment:  required larger plate/screws due to bone loss     Reproductive/Obstetrics negative OB ROS                             Anesthesia Physical Anesthesia Plan  ASA: 3  Anesthesia Plan: General/Spinal   Post-op Pain Management:    Induction:   PONV Risk Score and Plan: 2 and Ondansetron and Dexamethasone  Airway Management Planned: Natural Airway and  Nasal Cannula  Additional Equipment:   Intra-op Plan:   Post-operative Plan:   Informed Consent: I have reviewed the patients History and Physical, chart, labs and discussed the procedure including the risks, benefits and alternatives for the proposed anesthesia with the patient or authorized representative who has indicated his/her understanding and acceptance.     Dental Advisory Given  Plan Discussed with: Anesthesiologist, CRNA and Surgeon  Anesthesia Plan Comments: (Patient reports no bleeding problems and no anticoagulant use.  Plan for spinal with backup GA  Patient consented for risks of anesthesia including but not limited to:  - adverse reactions to  medications - damage to eyes, teeth, lips or other oral mucosa - nerve damage due to positioning  - risk of bleeding, infection and or nerve damage from spinal that could lead to paralysis - risk of headache or failed spinal - damage to teeth, lips or other oral mucosa - sore throat or hoarseness - damage to heart, brain, nerves, lungs, other parts of body or loss of life  Patient voiced understanding and assent.)       Anesthesia Quick Evaluation

## 2023-04-17 NOTE — Interval H&P Note (Signed)
 Patient history and physical updated. Consent reviewed including risks, benefits, and alternatives to surgery. Patient agrees with above plan to proceed with right posterior cemented total hip arthroplasty.

## 2023-04-17 NOTE — Evaluation (Signed)
 Physical Therapy Evaluation Patient Details Name: Jenny Roberts MRN: [id number] DOB: 04-26-1945 Today's Date: 04/17/2023  History of Present Illness  Patient is a 78 year old female with Right hip Osteoarthritis s/p right total hip arthroplasty.  Clinical Impression  Patient is agreeable to PT evaluation. She reports limited ambulation at home with rolling walker, activity tolerance limited by pain. She lives at home with her spouse with steps to enter.  Today the patient reports 6/10 pain in the right hip. She was able to complete bed mobility and transfers with Min A with instruction. Short distance ambulation with rolling walker with cues for safety and sequencing. The patient and family were instructed on posterior hip precautions with handout provided. Recommend to continue PT to maximize independence and to decrease caregiver burden.       If plan is discharge home, recommend the following: Assist for transportation;A little help with walking and/or transfers;A little help with bathing/dressing/bathroom;Assistance with cooking/housework   Can travel by private vehicle        Equipment Recommendations None recommended by PT (already has BSC delivered)  Recommendations for Other Services       Functional Status Assessment Patient has had a recent decline in their functional status and demonstrates the ability to make significant improvements in function in a reasonable and predictable amount of time.     Precautions / Restrictions Precautions Precautions: Posterior Hip Precaution Booklet Issued: Yes (comment) Recall of Precautions/Restrictions: Intact Restrictions Weight Bearing Restrictions Per Provider Order: Yes RLE Weight Bearing Per Provider Order: Weight bearing as tolerated      Mobility  Bed Mobility Overal bed mobility: Needs Assistance Bed Mobility: Supine to Sit, Sit to Supine     Supine to sit: Min assist Sit to supine: Min assist   General bed mobility  comments: assistance for RLE support. verbal cues for sequencing and technique    Transfers Overall transfer level: Needs assistance Equipment used: Rolling walker (2 wheels) Transfers: Sit to/from Stand Sit to Stand: Min assist           General transfer comment: initial lifting assistance for standing.    Ambulation/Gait Ambulation/Gait assistance: Contact guard assist, Min assist Gait Distance (Feet): 6 Feet Assistive device: Rolling walker (2 wheels) Gait Pattern/deviations: Step-to pattern, Decreased stance time - right, Decreased stride length, Narrow base of support Gait velocity: decreased     General Gait Details: short distance ambulation with occasional steadying assistance using rolling walker. patient declined further ambulation  Stairs            Wheelchair Mobility     Tilt Bed    Modified Rankin (Stroke Patients Only)       Balance Overall balance assessment: Needs assistance Sitting-balance support: Feet supported Sitting balance-Leahy Scale: Good     Standing balance support: Bilateral upper extremity supported Standing balance-Leahy Scale: Poor Standing balance comment: relying on rolling walker for UE support in standing                             Pertinent Vitals/Pain Pain Assessment Pain Assessment: 0-10 Pain Score: 6  Pain Location: R hip Pain Descriptors / Indicators: Discomfort Pain Intervention(s): Monitored during session, Limited activity within patient's tolerance, Premedicated before session, Repositioned, Ice applied    Home Living Family/patient expects to be discharged to:: Private residence Living Arrangements: Spouse/significant other Available Help at Discharge: Family Type of Home: House Home Access: Stairs to enter Entrance Stairs-Rails: Child psychotherapist  Stairs-Number of Steps: 4-5   Home Layout: One level Home Equipment: Agricultural consultant (2 wheels);Cane - single point      Prior Function  Prior Level of Function : Independent/Modified Independent             Mobility Comments: short distance ambulation with rolling walker. limited mobility recently secondary to pain ADLs Comments: independent     Extremity/Trunk Assessment   Upper Extremity Assessment Upper Extremity Assessment: Overall WFL for tasks assessed    Lower Extremity Assessment Lower Extremity Assessment: RLE deficits/detail RLE Deficits / Details: patient can complete LAQ while sitting independently. dorsiflexion 5/5 RLE Sensation: WNL       Communication   Communication Communication: No apparent difficulties    Cognition Arousal: Alert Behavior During Therapy: WFL for tasks assessed/performed   PT - Cognitive impairments: No apparent impairments                         Following commands: Intact       Cueing Cueing Techniques: Verbal cues, Tactile cues     General Comments General comments (skin integrity, edema, etc.): reviewed posterior hip precuations with both the patient and her family    Exercises     Assessment/Plan    PT Assessment Patient needs continued PT services  PT Problem List Decreased strength;Decreased range of motion;Decreased activity tolerance;Decreased balance;Decreased mobility;Pain;Decreased safety awareness;Decreased knowledge of use of DME       PT Treatment Interventions DME instruction;Gait training;Stair training;Functional mobility training;Therapeutic activities;Therapeutic exercise;Balance training;Neuromuscular re-education;Cognitive remediation;Patient/family education    PT Goals (Current goals can be found in the Care Plan section)  Acute Rehab PT Goals Patient Stated Goal: to go home PT Goal Formulation: With patient/family Time For Goal Achievement: 05/01/23 Potential to Achieve Goals: Good    Frequency BID     Co-evaluation               AM-PAC PT "6 Clicks" Mobility  Outcome Measure Help needed turning from your  back to your side while in a flat bed without using bedrails?: None Help needed moving from lying on your back to sitting on the side of a flat bed without using bedrails?: A Little Help needed moving to and from a bed to a chair (including a wheelchair)?: A Little Help needed standing up from a chair using your arms (e.g., wheelchair or bedside chair)?: A Little Help needed to walk in hospital room?: A Little Help needed climbing 3-5 steps with a railing? : A Little 6 Click Score: 19    End of Session Equipment Utilized During Treatment: Gait belt Activity Tolerance: Patient tolerated treatment well Patient left: in bed;with call bell/phone within reach;with SCD's reapplied (hip abduction pillow) Nurse Communication: Mobility status PT Visit Diagnosis: Other abnormalities of gait and mobility (R26.89);Difficulty in walking, not elsewhere classified (R26.2)    Time: 1610-9604 PT Time Calculation (min) (ACUTE ONLY): 31 min   Charges:   PT Evaluation $PT Eval Low Complexity: 1 Low PT Treatments $Therapeutic Activity: 8-22 mins PT General Charges $$ ACUTE PT VISIT: 1 Visit         Donna Bernard, PT, MPT   Ina Homes 04/17/2023, 3:32 PM

## 2023-04-17 NOTE — Transfer of Care (Signed)
 Immediate Anesthesia Transfer of Care Note  Patient: Jenny Roberts  Procedure(s) Performed: ARTHROPLASTY, HIP, TOTAL,POSTERIOR APPROACH (Right: Hip)  Patient Location: PACU  Anesthesia Type:Spinal  Level of Consciousness: drowsy and patient cooperative  Airway & Oxygen Therapy: Patient Spontanous Breathing and Patient connected to face mask oxygen  Post-op Assessment: Report given to RN and Post -op Vital signs reviewed and stable  Post vital signs: Reviewed and stable  Last Vitals:  Vitals Value Taken Time  BP 112/60 04/17/23 1006  Temp    Pulse 75 04/17/23 1008  Resp 17 04/17/23 1008  SpO2 100 % 04/17/23 1008  Vitals shown include unfiled device data.  Last Pain:  Vitals:   04/17/23 0629  PainSc: 0-No pain         Complications: No notable events documented.

## 2023-04-18 ENCOUNTER — Encounter: Payer: Self-pay | Admitting: Orthopedic Surgery

## 2023-04-18 DIAGNOSIS — Z96641 Presence of right artificial hip joint: Secondary | ICD-10-CM | POA: Diagnosis not present

## 2023-04-18 DIAGNOSIS — Z9221 Personal history of antineoplastic chemotherapy: Secondary | ICD-10-CM | POA: Diagnosis not present

## 2023-04-18 DIAGNOSIS — E039 Hypothyroidism, unspecified: Secondary | ICD-10-CM | POA: Diagnosis not present

## 2023-04-18 DIAGNOSIS — Z79890 Hormone replacement therapy (postmenopausal): Secondary | ICD-10-CM | POA: Diagnosis not present

## 2023-04-18 DIAGNOSIS — Z853 Personal history of malignant neoplasm of breast: Secondary | ICD-10-CM | POA: Diagnosis not present

## 2023-04-18 DIAGNOSIS — M1611 Unilateral primary osteoarthritis, right hip: Secondary | ICD-10-CM | POA: Diagnosis not present

## 2023-04-18 DIAGNOSIS — Z85828 Personal history of other malignant neoplasm of skin: Secondary | ICD-10-CM | POA: Diagnosis not present

## 2023-04-18 LAB — BASIC METABOLIC PANEL WITH GFR
Anion gap: 6 (ref 5–15)
BUN: 20 mg/dL (ref 8–23)
CO2: 24 mmol/L (ref 22–32)
Calcium: 8.8 mg/dL — ABNORMAL LOW (ref 8.9–10.3)
Chloride: 107 mmol/L (ref 98–111)
Creatinine, Ser: 0.68 mg/dL (ref 0.44–1.00)
GFR, Estimated: >60 mL/min (ref >60)
Glucose, Bld: 132 mg/dL — ABNORMAL HIGH (ref 70–99)
Potassium: 4.1 mmol/L (ref 3.5–5.1)
Sodium: 137 mmol/L (ref 135–145)

## 2023-04-18 LAB — CBC
HCT: 30.4 % — ABNORMAL LOW (ref 36.0–46.0)
Hemoglobin: 10.4 g/dL — ABNORMAL LOW (ref 12.0–15.0)
MCH: 29.6 pg (ref 26.0–34.0)
MCHC: 34.2 g/dL (ref 30.0–36.0)
MCV: 86.6 fL (ref 80.0–100.0)
Platelets: 115 10*3/uL — ABNORMAL LOW (ref 150–400)
RBC: 3.51 MIL/uL — ABNORMAL LOW (ref 3.87–5.11)
RDW: 12.5 % (ref 11.5–15.5)
WBC: 8.4 10*3/uL (ref 4.0–10.5)
nRBC: 0 % (ref 0.0–0.2)

## 2023-04-18 MED ORDER — OXYCODONE HCL 5 MG PO TABS: 2.5000 mg | ORAL_TABLET | Freq: Three times a day (TID) | ORAL | 0 refills | Status: DC | PRN

## 2023-04-18 MED ORDER — ENOXAPARIN SODIUM 40 MG/0.4ML IJ SOSY: 40.0000 mg | PREFILLED_SYRINGE | INTRAMUSCULAR | 0 refills | Status: DC

## 2023-04-18 MED ORDER — CELECOXIB 200 MG PO CAPS: 200.0000 mg | ORAL_CAPSULE | Freq: Two times a day (BID) | ORAL | 0 refills | Status: AC

## 2023-04-18 MED ORDER — DOCUSATE SODIUM 100 MG PO CAPS: 100.0000 mg | ORAL_CAPSULE | Freq: Two times a day (BID) | ORAL | 0 refills | Status: DC

## 2023-04-18 MED ORDER — KETOROLAC TROMETHAMINE 15 MG/ML IJ SOLN
INTRAMUSCULAR | Status: AC
  Filled 2023-04-18: qty 1

## 2023-04-18 MED ORDER — SODIUM CHLORIDE 0.9 % IV BOLUS
500.0000 mL | Freq: Once | INTRAVENOUS | Status: AC
  Administered 2023-04-18: 500 mL via INTRAVENOUS

## 2023-04-18 MED ORDER — ONDANSETRON HCL 4 MG PO TABS: 4.0000 mg | ORAL_TABLET | Freq: Four times a day (QID) | ORAL | 0 refills | Status: DC | PRN

## 2023-04-18 MED ORDER — TRAMADOL HCL 50 MG PO TABS: 50.0000 mg | ORAL_TABLET | Freq: Four times a day (QID) | ORAL | 0 refills | Status: DC | PRN

## 2023-04-18 NOTE — Progress Notes (Signed)
 Patient meets all requirements to safely be discharged home. Equipment provided (DME) on 3/27 and sharps container with education on administration and disposal given. Patient spouse and daughter present for all d/c instructions. All questions answered to patient and family satisfaction. Iv infiltration site from Normal saline bolus has subsided.

## 2023-04-18 NOTE — Anesthesia Postprocedure Evaluation (Signed)
 Anesthesia Post Note  Patient: Jenny Roberts  Procedure(s) Performed: ARTHROPLASTY, HIP, TOTAL,POSTERIOR APPROACH (Right: Hip)  Patient location during evaluation: Nursing Unit Anesthesia Type: Combined General/Spinal Level of consciousness: awake Respiratory status: spontaneous breathing Cardiovascular status: stable Postop Assessment: no headache Anesthetic complications: no   No notable events documented.   Last Vitals:  Vitals:   04/18/23 0306 04/18/23 0607  BP: (!) 101/53 (!) 112/59  Pulse: 81 73  Resp: 14 16  Temp: (!) 36.1 C 36.6 C  SpO2: 94% 93%    Last Pain:  Vitals:   04/18/23 0607  TempSrc:   PainSc: 4                  Jaye Beagle

## 2023-04-18 NOTE — Discharge Summary (Signed)
 Physician Discharge Summary  Patient ID: Jenny Roberts MRN: [id number] DOB/AGE: [birth year] 78 y.o.  Admit date: 04/17/2023 Discharge date: 04/18/2023  Admission Diagnoses:  Osteoarthritis of right hip, unspecified osteoarthritis type [M16.11] S/P total right hip arthroplasty [Z96.641]   Discharge Diagnoses: Patient Active Problem List   Diagnosis Date Noted   S/P total right hip arthroplasty 04/17/2023   Dysuria 04/04/2023   Preop examination 02/19/2023   Thrombocytopenia (HCC) 06/19/2022   Right hip pain 05/27/2022   Drowsiness 02/11/2022   Chronic radicular lumbar pain 09/06/2021   Lumbar facet arthropathy 09/06/2021   Lumbar degenerative disc disease 09/06/2021   Chronic pain syndrome 09/06/2021   Contusion of left knee 07/10/2021   Right-sided low back pain with right-sided sciatica 05/11/2021   Sleeping difficulty 10/16/2020   Osteoporosis 07/20/2020   Fall 11/23/2019   Benign essential tremor 09/22/2018   Positive colorectal cancer screening using Cologuard test 04/06/2018   Sepsis secondary to UTI (HCC) 09/17/2015   Hypothyroidism 07/05/2015   Vitamin D deficiency 07/05/2015   Anxiety state 06/15/2014   Hyperlipidemia 06/15/2014   Personal history of malignant neoplasm of breast 09/29/2012    Past Medical History:  Diagnosis Date   Age related osteoporosis    Anxiety    Breast cancer (HCC) 2001   Right. Treated with lumpectomy followed by Chemotherapy and Radiation therapy   COVID-19 03/06/2023   DDD (degenerative disc disease), lumbar    Depression    Hyperlipidemia    Hypothyroidism    Lumbar stenosis with neurogenic claudication    Nontraumatic rupture of tendon of thumb 06/15/2014   Personal history of chemotherapy    Personal history of radiation therapy    PONV (postoperative nausea and vomiting)    Recurrent UTI    Right forearm fracture 05/22/2020   Sepsis secondary to UTI (HCC) 04/2022   Skin cancer 09/2004   treated at [facility]    Thrombocytopenia (HCC)    Vitamin D deficiency      Transfusion: none   Consultants (if any):   Discharged Condition: Improved  Hospital Course: IVONNA KINNICK is an 78 y.o. female who was admitted 04/17/2023 with a diagnosis of S/P total right hip arthroplasty and went to the operating room on 04/17/2023 and underwent the above named procedures.    Surgeries: Procedure(s): ARTHROPLASTY, HIP, TOTAL,POSTERIOR APPROACH on 04/17/2023 Patient tolerated the surgery well. Taken to PACU where she was stabilized and then transferred to the orthopedic floor.  Started on Lovenox 40 mg q 24 hrs. TEDs and SCDs applied bilaterally. Heels elevated on bed. No evidence of DVT. Negative Homan. Physical therapy started on day #1 for gait training and transfer. OT started day #1 for ADL and assisted devices.  Patient's IV was d/c on day #1. Patient was able to safely and independently complete all PT goals. PT recommending discharge to home.    On post op day #1 patient was stable and ready for discharge to home with HHPT.  Implants: Cup: Trident Tritanium Clusterhole 48/D w/x2 screws    Liner: MDM  38/D  Stem: Accolade C #4 x 132 de with 14mm distal spacer  Head:LFIT 22.2 +35mm inner ball, MDM X3 22.2/38D outer ball   She was given perioperative antibiotics:  Anti-infectives (From admission, onward)    Start     Dose/Rate Route Frequency Ordered Stop   04/17/23 1400  ceFAZolin (ANCEF) IVPB 2g/100 mL premix        2 g 200 mL/hr over 30 Minutes Intravenous Every 6 hours  04/17/23 1326 04/17/23 2045   04/17/23 0600  ceFAZolin (ANCEF) IVPB 2g/100 mL premix        2 g 200 mL/hr over 30 Minutes Intravenous On call to O.R. 04/16/23 2147 04/17/23 0800     .  She was given sequential compression devices, early ambulation, and Lovneox, TEDs for DVT prophylaxis.  She benefited maximally from the hospital stay and there were no complications.    Recent vital signs:  Vitals:   04/18/23 0607 04/18/23 0800   BP: (!) 112/59 (!) 107/44  Pulse: 73 74  Resp: 16   Temp: 97.8 F (36.6 C) 98 F (36.7 C)  SpO2: 93% 94%    Recent laboratory studies:  Lab Results  Component Value Date   HGB 10.4 (L) 04/18/2023   HGB 13.0 02/26/2023   HGB 13.2 06/19/2022   Lab Results  Component Value Date   WBC 8.4 04/18/2023   PLT 115 (L) 04/18/2023   Lab Results  Component Value Date   INR 1.4 (H) 06/10/2022   Lab Results  Component Value Date   NA 137 04/18/2023   K 4.1 04/18/2023   CL 107 04/18/2023   CO2 24 04/18/2023   BUN 20 04/18/2023   CREATININE 0.68 04/18/2023   GLUCOSE 132 (H) 04/18/2023    Discharge Medications:   Allergies as of 04/18/2023       Reactions   Amoxicillin-pot Clavulanate Diarrhea, Other (See Comments)   Has patient had a PCN reaction causing immediate rash, facial/tongue/throat swelling, SOB or lightheadedness with hypotension: No Has patient had a PCN reaction causing severe rash involving mucus membranes or skin necrosis: No Has patient had a PCN reaction that required hospitalization No Has patient had a PCN reaction occurring within the last 10 years: Yes If all of the above answers are "NO", then may proceed with Cephalosporin use.   Codeine Hives, Other (See Comments)   shakes   Latex Rash, Itching        Medication List     STOP taking these medications    BC HEADACHE POWDER PO       TAKE these medications    acetaminophen 500 MG tablet Commonly known as: TYLENOL Take 1,000 mg by mouth every 6 (six) hours as needed (pain.).   alendronate 70 MG tablet Commonly known as: FOSAMAX Take 1 tablet (70 mg total) by mouth every 7 (seven) days. Take with a full glass of water on an empty stomach. What changed: when to take this   celecoxib 200 MG capsule Commonly known as: CeleBREX Take 1 capsule (200 mg total) by mouth 2 (two) times daily for 10 days.   clonazePAM 0.5 MG tablet Commonly known as: KLONOPIN Take 1 tablet (0.5 mg total) by  mouth at bedtime.   docusate sodium 100 MG capsule Commonly known as: COLACE Take 1 capsule (100 mg total) by mouth 2 (two) times daily.   enoxaparin 40 MG/0.4ML injection Commonly known as: LOVENOX Inject 0.4 mLs (40 mg total) into the skin daily for 14 days. Start taking on: April 19, 2023   gabapentin 300 MG capsule Commonly known as: Neurontin Take 1 capsule (300 mg total) by mouth 2 (two) times daily. What changed:  how much to take when to take this   [facility] Max Lidocaine 4 % Liqd Generic drug: Lidocaine HCl Apply 1 Application topically 3 (three) times daily as needed (pain.).   levothyroxine 88 MCG tablet Commonly known as: SYNTHROID Take 1 tablet (88 mcg total) by  mouth daily. What changed: when to take this   lovastatin 40 MG tablet Commonly known as: MEVACOR TAKE 1 TABLET(40 MG) BY MOUTH DAILY What changed: See the new instructions.   nitrofurantoin (macrocrystal-monohydrate) 100 MG capsule Commonly known as: Macrobid Take 1 capsule (100 mg total) by mouth 2 (two) times daily.   ondansetron 4 MG tablet Commonly known as: ZOFRAN Take 1 tablet (4 mg total) by mouth every 6 (six) hours as needed for nausea.   oxyCODONE 5 MG immediate release tablet Commonly known as: Roxicodone Take 0.5 tablets (2.5 mg total) by mouth every 8 (eight) hours as needed for breakthrough pain.   QC Vitamin D3 50 MCG (2000 UT) Caps Generic drug: Cholecalciferol Take 2 capsules by mouth daily.   sertraline 100 MG tablet Commonly known as: ZOLOFT TAKE 1 TABLET BY MOUTH DAILY What changed: when to take this   traMADol 50 MG tablet Commonly known as: ULTRAM Take 1 tablet (50 mg total) by mouth every 6 (six) hours as needed for moderate pain (pain score 4-6).               Durable Medical Equipment  (From admission, onward)           Start     Ordered   04/18/23 0859  For home use only DME 3 n 1  Once        04/18/23 0858   04/17/23 1105  For home use only DME  Bedside commode  Once       Question:  Patient needs a bedside commode to treat with the following condition  Answer:  Impaired mobility   04/17/23 1104            Diagnostic Studies: DG Pelvis Portable Result Date: 04/17/2023 CLINICAL DATA:  Elective surgery. EXAM: PORTABLE PELVIS 1-2 VIEWS COMPARISON:  None Available. FINDINGS: Right hip arthroplasty in expected alignment. No periprosthetic lucency or fracture. Recent postsurgical change includes air and edema in the soft tissues. IMPRESSION: Right hip arthroplasty in place. Electronically Signed   By: Narda Rutherford M.D.   On: 04/17/2023 11:20    Disposition:      Follow-up Information     Evon Slack, PA-C Follow up in 2 week(s).   Specialties: Orthopedic Surgery, Emergency Medicine Contact information: [address] [place] [address] [phone]                  Signed: Evon Slack 04/18/2023, 9:02 AM

## 2023-04-18 NOTE — Progress Notes (Signed)
   Subjective: 1 Day Post-Op Procedure(s) (LRB): ARTHROPLASTY, HIP, TOTAL,POSTERIOR APPROACH (Right) Patient reports pain as mild.   Patient is well, and has had no acute complaints or problems Denies any CP, SOB, ABD pain. We will continue therapy today.  Plan is to go Home after hospital stay.  Objective: Vital signs in last 24 hours: Temp:  [97 F (36.1 C)-98.4 F (36.9 C)] 98 F (36.7 C) (03/28 0800) Pulse Rate:  [69-87] 74 (03/28 0800) Resp:  [10-25] 16 (03/28 0607) BP: (101-135)/(44-89) 107/44 (03/28 0800) SpO2:  [89 %-100 %] 94 % (03/28 0800) Weight:  [67 kg] 67 kg (03/28 0305)  Intake/Output from previous day: 03/27 0701 - 03/28 0700 In: 2040.2 [P.O.:240; I.V.:1700.2; IV Piggyback:100] Out: 1050 [Urine:850; Blood:200] Intake/Output this shift: No intake/output data recorded.  Recent Labs    04/18/23 0630  HGB 10.4*   Recent Labs    04/18/23 0630  WBC 8.4  RBC 3.51*  HCT 30.4*  PLT 115*   Recent Labs    04/18/23 0630  NA 137  K 4.1  CL 107  CO2 24  BUN 20  CREATININE 0.68  GLUCOSE 132*  CALCIUM 8.8*   No results for input(s): "LABPT", "INR" in the last 72 hours.  EXAM General - Patient is Alert, Appropriate, and Oriented Extremity - Neurovascular intact Sensation intact distally Intact pulses distally Dorsiflexion/Plantar flexion intact Dressing - dressing C/D/I and no drainage Motor Function - intact, moving foot and toes well on exam.   Past Medical History:  Diagnosis Date   Age related osteoporosis    Anxiety    Breast cancer (HCC) 2001   Right. Treated with lumpectomy followed by Chemotherapy and Radiation therapy   COVID-19 03/06/2023   DDD (degenerative disc disease), lumbar    Depression    Hyperlipidemia    Hypothyroidism    Lumbar stenosis with neurogenic claudication    Nontraumatic rupture of tendon of thumb 06/15/2014   Personal history of chemotherapy    Personal history of radiation therapy    PONV (postoperative  nausea and vomiting)    Recurrent UTI    Right forearm fracture 05/22/2020   Sepsis secondary to UTI (HCC) 04/2022   Skin cancer 09/2004   treated at [facility]   Thrombocytopenia (HCC)    Vitamin D deficiency     Assessment/Plan:   1 Day Post-Op Procedure(s) (LRB): ARTHROPLASTY, HIP, TOTAL,POSTERIOR APPROACH (Right) Principal Problem:   S/P total right hip arthroplasty  Estimated body mass index is 26.18 kg/m as calculated from the following:   Height as of this encounter: 5\' 3"  (1.6 m).   Weight as of this encounter: 67 kg. Advance diet Up with therapy Pain well controlled Labs and VSS Patient completed all PT goals this am. CM to assist with discharge to home with HHPT today  DVT Prophylaxis - Lovenox, TED hose, and SCDs Weight-Bearing as tolerated to right leg   T. Cranston Neighbor, PA-C [facility] 04/18/2023, 8:56 AM

## 2023-04-18 NOTE — Discharge Instructions (Signed)
Instructions after Posterior Total Hip Replacement        Dr. Regenia Skeeter., M.D.      Dept. of Orthopaedics & Sports Medicine  [facility]  [address]  [place], [place]  [address]  Phone: [phone]   Fax: [phone]    DIET: Drink plenty of non-alcoholic fluids. Resume your normal diet. Include foods high in fiber.  ACTIVITY:  You may use crutches or a walker with weight-bearing as tolerated, unless instructed otherwise. You may be weaned off of the walker or crutches by your Physical Therapist.  Do NOT reach below the level of your knees or cross your legs until allowed.    Continue doing gentle exercises. Exercising will reduce the pain and swelling, increase motion, and prevent muscle weakness.   Please continue to use the TED compression stockings for 2 weeks. You may remove the stockings at night, but should reapply them in the morning. Do not drive or operate any equipment until instructed.  WOUND CARE:  Continue to use ice packs periodically to reduce pain and swelling. You may shower with honeycomb dressing 3 days after surgery. Do not submerge incision site under water. Remove honeycomb dressing 7 days after surgery and allow dermabond to fall off on its own.   MEDICATIONS: You may resume your regular medications. Please take the pain medication as prescribed on the medication list. Do not take pain medication on an empty stomach. You have been given a prescription for a blood thinner to prevent blood clots. Please take the medication as instructed. (NOTE: After completing a 2 week course of Lovenox, take one Enteric-coated 81 mg aspirin twice a day for 3 additional weeks.) Pain medications and iron supplements can cause constipation. Use a stool softener (Senokot or Colace) on a daily basis and a laxative (dulcolax or miralax) as needed. Do not drive or drink alcoholic beverages when taking pain medications.   POSTOPERATIVE CONSTIPATION  PROTOCOL Constipation - defined medically as fewer than three stools per week and severe constipation as less than one stool per week.  One of the most common issues patients have following surgery is constipation.  Even if you have a regular bowel pattern at home, your normal regimen is likely to be disrupted due to multiple reasons following surgery.  Combination of anesthesia, postoperative narcotics, change in appetite and fluid intake all can affect your bowels.  In order to avoid complications following surgery, here are some recommendations in order to help you during your recovery period.  Colace (docusate) - Pick up an over-the-counter form of Colace or another stool softener and take twice a day as long as you are requiring postoperative pain medications.  Take with a full glass of water daily.  If you experience loose stools or diarrhea, hold the colace until you stool forms back up.  If your symptoms do not get better within 1 week or if they get worse, check with your doctor.  Dulcolax (bisacodyl) - Pick up over-the-counter and take as directed by the product packaging as needed to assist with the movement of your bowels.  Take with a full glass of water.  Use this product as needed if not relieved by Colace only.   MiraLax (polyethylene glycol) - Pick up over-the-counter to have on hand.  MiraLax is a solution that will increase the amount of water in your bowels to assist with bowel movements.  Take as directed and can mix with a glass of water, juice, soda, coffee, or tea.  Take if you go more than two days without a movement. Do not use MiraLax more than once per day. Call your doctor if you are still constipated or irregular after using this medication for 7 days in a row.  If you continue to have problems with postoperative constipation, please contact the office for further assistance and recommendations.  If you experience "the worst abdominal pain ever" or develop nausea or  vomiting, please contact the office immediatly for further recommendations for treatment.   CALL THE OFFICE FOR: Temperature above 101 degrees Excessive bleeding or drainage on the dressing. Excessive swelling, coldness, or paleness of the toes. Persistent nausea and vomiting.  FOLLOW-UP:  You should have an appointment to return to the office in 2 weeks after surgery. Arrangements have been made for continuation of Physical Therapy (either home therapy or outpatient therapy).

## 2023-04-18 NOTE — Progress Notes (Signed)
 Patient is not able to walk the distance required to go the bathroom, or she is unable to safely negotiate stairs required to access the bathroom.  A 3in1 BSC will alleviate this problem.       Lollie Marrow, PA-C [facility]

## 2023-04-18 NOTE — Progress Notes (Addendum)
 Physical Therapy Treatment Patient Details Name: Jenny Roberts MRN: [id number] DOB: 1945-08-15 Today's Date: 04/18/2023   History of Present Illness Patient is a 78 year old female with Right hip Osteoarthritis s/p right total hip arthroplasty.    PT Comments  Pt was long sitting in bed upon arrival. She is A and O and extremely motivated and pleasant. Supportive family present throughout session and will be assisting her at DC. Pt was safely able to exit bed, stand to RW, and tolerate ambulation > 200 ft. Pt also able to safely perform ascending/descending stairs without safety concern. Overall, pt is progressing well. She is cleared from an acute PT standpoint for safe DC home with HHPT to follow. Both pt and caregiver state confidence in safe DC home .     If plan is discharge home, recommend the following: A little help with walking and/or transfers;A little help with bathing/dressing/bathroom;Assistance with cooking/housework;Assist for transportation;Help with stairs or ramp for entrance     Equipment Recommendations  None recommended by PT (pt has all DME needs met already)       Precautions / Restrictions Precautions Precautions: Posterior Hip Precaution Booklet Issued: Yes (comment) Recall of Precautions/Restrictions: Intact Restrictions Weight Bearing Restrictions Per Provider Order: Yes RLE Weight Bearing Per Provider Order: Weight bearing as tolerated     Mobility  Bed Mobility Overal bed mobility: Needs Assistance Bed Mobility: Supine to Sit, Sit to Supine  Supine to sit: Supervision  General bed mobility comments: no physical assistance required to exit bed    Transfers Overall transfer level: Needs assistance Equipment used: Rolling walker (2 wheels) Transfers: Sit to/from Stand Sit to Stand: Supervision   Ambulation/Gait Ambulation/Gait assistance: Supervision Gait Distance (Feet): 200 Feet Assistive device: Rolling walker (2 wheels) Gait  Pattern/deviations: Step-through pattern, Antalgic Gait velocity: decreased  General Gait Details: pt ambulated ~ 200 ft without LOB or safety concerns. Vcs for shoulder depression only   Stairs Stairs: Yes Stairs assistance: Supervision Stair Management: Two rails, Step to pattern, Forwards Number of Stairs: 4 General stair comments: no physical assistance required for pt to ascend/descend stairs    Balance Overall balance assessment: Needs assistance Sitting-balance support: Feet supported Sitting balance-Leahy Scale: Good     Standing balance support: Bilateral upper extremity supported, During functional activity, Reliant on assistive device for balance Standing balance-Leahy Scale: Good     Communication Communication Communication: No apparent difficulties  Cognition Arousal: Alert Behavior During Therapy: WFL for tasks assessed/performed   PT - Cognitive impairments: No apparent impairments    PT - Cognition Comments: Pt is A and O x 4. Caregiver present throughout session Following commands: Intact      Cueing Cueing Techniques: Verbal cues, Tactile cues     General Comments General comments (skin integrity, edema, etc.): reviewed importance of routine mobility, HEP, car transfers, and overall post acute care needs. Both pt and spouse feel confident is safe DC home when cleared medically      Pertinent Vitals/Pain Pain Assessment Pain Assessment: 0-10 Pain Score: 7  Pain Location: R hip Pain Descriptors / Indicators: Discomfort Pain Intervention(s): Limited activity within patient's tolerance, Monitored during session, Premedicated before session, Repositioned, Ice applied     PT Goals (current goals can now be found in the care plan section) Acute Rehab PT Goals Patient Stated Goal: to go home Progress towards PT goals: Progressing toward goals    Frequency    BID       AM-PAC PT "6 Clicks" Mobility  Outcome Measure  Help needed turning from  your back to your side while in a flat bed without using bedrails?: None Help needed moving from lying on your back to sitting on the side of a flat bed without using bedrails?: A Little Help needed moving to and from a bed to a chair (including a wheelchair)?: A Little Help needed standing up from a chair using your arms (e.g., wheelchair or bedside chair)?: A Little Help needed to walk in hospital room?: A Little Help needed climbing 3-5 steps with a railing? : A Little 6 Click Score: 19    End of Session Equipment Utilized During Treatment: Gait belt Activity Tolerance: Patient tolerated treatment well Patient left: in chair;with call bell/phone within reach;with family/visitor present;with nursing/sitter in room Nurse Communication: Mobility status PT Visit Diagnosis: Other abnormalities of gait and mobility (R26.89);Difficulty in walking, not elsewhere classified (R26.2)     Time: 3086-5784 PT Time Calculation (min) (ACUTE ONLY): 30 min  Charges:    $Gait Training: 8-22 mins $Therapeutic Activity: 8-22 mins PT General Charges $$ ACUTE PT VISIT: 1 Visit                     Jetta Lout PTA 04/18/23, 8:45 AM

## 2023-04-18 NOTE — Progress Notes (Signed)
 Patient is doing well, while receiving a fluid bolus she said it began to "sting a little bit" at the end, and her IV did infiltrate. Removed IV. She is asymptomatic, applied heat pack and washcloth to the site and will reassess. Sitting comfortably in recliner chair at this time, with call bell in her lap. Husband at bedside. Notified Allie Bossier of IV site and intervention taken.

## 2023-04-20 DIAGNOSIS — J302 Other seasonal allergic rhinitis: Secondary | ICD-10-CM | POA: Diagnosis not present

## 2023-04-20 DIAGNOSIS — F32A Depression, unspecified: Secondary | ICD-10-CM | POA: Diagnosis not present

## 2023-04-20 DIAGNOSIS — M5416 Radiculopathy, lumbar region: Secondary | ICD-10-CM | POA: Diagnosis not present

## 2023-04-20 DIAGNOSIS — Z471 Aftercare following joint replacement surgery: Secondary | ICD-10-CM | POA: Diagnosis not present

## 2023-04-20 DIAGNOSIS — E039 Hypothyroidism, unspecified: Secondary | ICD-10-CM | POA: Diagnosis not present

## 2023-04-20 DIAGNOSIS — Z96641 Presence of right artificial hip joint: Secondary | ICD-10-CM | POA: Diagnosis not present

## 2023-04-20 DIAGNOSIS — F419 Anxiety disorder, unspecified: Secondary | ICD-10-CM | POA: Diagnosis not present

## 2023-04-20 DIAGNOSIS — Z853 Personal history of malignant neoplasm of breast: Secondary | ICD-10-CM | POA: Diagnosis not present

## 2023-04-20 DIAGNOSIS — M51369 Other intervertebral disc degeneration, lumbar region without mention of lumbar back pain or lower extremity pain: Secondary | ICD-10-CM | POA: Diagnosis not present

## 2023-04-20 DIAGNOSIS — Z9181 History of falling: Secondary | ICD-10-CM | POA: Diagnosis not present

## 2023-04-20 DIAGNOSIS — G250 Essential tremor: Secondary | ICD-10-CM | POA: Diagnosis not present

## 2023-04-20 DIAGNOSIS — G894 Chronic pain syndrome: Secondary | ICD-10-CM | POA: Diagnosis not present

## 2023-04-20 DIAGNOSIS — Z79899 Other long term (current) drug therapy: Secondary | ICD-10-CM | POA: Diagnosis not present

## 2023-04-20 DIAGNOSIS — D696 Thrombocytopenia, unspecified: Secondary | ICD-10-CM | POA: Diagnosis not present

## 2023-04-20 DIAGNOSIS — Z791 Long term (current) use of non-steroidal anti-inflammatories (NSAID): Secondary | ICD-10-CM | POA: Diagnosis not present

## 2023-04-20 DIAGNOSIS — M810 Age-related osteoporosis without current pathological fracture: Secondary | ICD-10-CM | POA: Diagnosis not present

## 2023-04-20 DIAGNOSIS — Z7901 Long term (current) use of anticoagulants: Secondary | ICD-10-CM | POA: Diagnosis not present

## 2023-04-20 DIAGNOSIS — E559 Vitamin D deficiency, unspecified: Secondary | ICD-10-CM | POA: Diagnosis not present

## 2023-04-20 DIAGNOSIS — E785 Hyperlipidemia, unspecified: Secondary | ICD-10-CM | POA: Diagnosis not present

## 2023-04-23 DIAGNOSIS — Z7901 Long term (current) use of anticoagulants: Secondary | ICD-10-CM | POA: Diagnosis not present

## 2023-04-23 DIAGNOSIS — F32A Depression, unspecified: Secondary | ICD-10-CM | POA: Diagnosis not present

## 2023-04-23 DIAGNOSIS — J302 Other seasonal allergic rhinitis: Secondary | ICD-10-CM | POA: Diagnosis not present

## 2023-04-23 DIAGNOSIS — E559 Vitamin D deficiency, unspecified: Secondary | ICD-10-CM | POA: Diagnosis not present

## 2023-04-23 DIAGNOSIS — D696 Thrombocytopenia, unspecified: Secondary | ICD-10-CM | POA: Diagnosis not present

## 2023-04-23 DIAGNOSIS — G250 Essential tremor: Secondary | ICD-10-CM | POA: Diagnosis not present

## 2023-04-23 DIAGNOSIS — Z791 Long term (current) use of non-steroidal anti-inflammatories (NSAID): Secondary | ICD-10-CM | POA: Diagnosis not present

## 2023-04-23 DIAGNOSIS — E785 Hyperlipidemia, unspecified: Secondary | ICD-10-CM | POA: Diagnosis not present

## 2023-04-23 DIAGNOSIS — M51369 Other intervertebral disc degeneration, lumbar region without mention of lumbar back pain or lower extremity pain: Secondary | ICD-10-CM | POA: Diagnosis not present

## 2023-04-23 DIAGNOSIS — M5416 Radiculopathy, lumbar region: Secondary | ICD-10-CM | POA: Diagnosis not present

## 2023-04-23 DIAGNOSIS — M810 Age-related osteoporosis without current pathological fracture: Secondary | ICD-10-CM | POA: Diagnosis not present

## 2023-04-23 DIAGNOSIS — G894 Chronic pain syndrome: Secondary | ICD-10-CM | POA: Diagnosis not present

## 2023-04-23 DIAGNOSIS — Z471 Aftercare following joint replacement surgery: Secondary | ICD-10-CM | POA: Diagnosis not present

## 2023-04-23 DIAGNOSIS — E039 Hypothyroidism, unspecified: Secondary | ICD-10-CM | POA: Diagnosis not present

## 2023-04-23 DIAGNOSIS — Z79899 Other long term (current) drug therapy: Secondary | ICD-10-CM | POA: Diagnosis not present

## 2023-04-23 DIAGNOSIS — F419 Anxiety disorder, unspecified: Secondary | ICD-10-CM | POA: Diagnosis not present

## 2023-04-24 ENCOUNTER — Other Ambulatory Visit: Payer: Self-pay | Admitting: Nurse Practitioner

## 2023-04-25 DIAGNOSIS — E039 Hypothyroidism, unspecified: Secondary | ICD-10-CM | POA: Diagnosis not present

## 2023-04-25 DIAGNOSIS — J302 Other seasonal allergic rhinitis: Secondary | ICD-10-CM | POA: Diagnosis not present

## 2023-04-25 DIAGNOSIS — M810 Age-related osteoporosis without current pathological fracture: Secondary | ICD-10-CM | POA: Diagnosis not present

## 2023-04-25 DIAGNOSIS — Z7901 Long term (current) use of anticoagulants: Secondary | ICD-10-CM | POA: Diagnosis not present

## 2023-04-25 DIAGNOSIS — F32A Depression, unspecified: Secondary | ICD-10-CM | POA: Diagnosis not present

## 2023-04-25 DIAGNOSIS — E559 Vitamin D deficiency, unspecified: Secondary | ICD-10-CM | POA: Diagnosis not present

## 2023-04-25 DIAGNOSIS — M5416 Radiculopathy, lumbar region: Secondary | ICD-10-CM | POA: Diagnosis not present

## 2023-04-25 DIAGNOSIS — Z79899 Other long term (current) drug therapy: Secondary | ICD-10-CM | POA: Diagnosis not present

## 2023-04-25 DIAGNOSIS — E785 Hyperlipidemia, unspecified: Secondary | ICD-10-CM | POA: Diagnosis not present

## 2023-04-25 DIAGNOSIS — G894 Chronic pain syndrome: Secondary | ICD-10-CM | POA: Diagnosis not present

## 2023-04-25 DIAGNOSIS — Z791 Long term (current) use of non-steroidal anti-inflammatories (NSAID): Secondary | ICD-10-CM | POA: Diagnosis not present

## 2023-04-25 DIAGNOSIS — Z471 Aftercare following joint replacement surgery: Secondary | ICD-10-CM | POA: Diagnosis not present

## 2023-04-25 DIAGNOSIS — G250 Essential tremor: Secondary | ICD-10-CM | POA: Diagnosis not present

## 2023-04-25 DIAGNOSIS — F419 Anxiety disorder, unspecified: Secondary | ICD-10-CM | POA: Diagnosis not present

## 2023-04-25 DIAGNOSIS — D696 Thrombocytopenia, unspecified: Secondary | ICD-10-CM | POA: Diagnosis not present

## 2023-04-25 DIAGNOSIS — M51369 Other intervertebral disc degeneration, lumbar region without mention of lumbar back pain or lower extremity pain: Secondary | ICD-10-CM | POA: Diagnosis not present

## 2023-04-28 DIAGNOSIS — Z791 Long term (current) use of non-steroidal anti-inflammatories (NSAID): Secondary | ICD-10-CM | POA: Diagnosis not present

## 2023-04-28 DIAGNOSIS — Z7901 Long term (current) use of anticoagulants: Secondary | ICD-10-CM | POA: Diagnosis not present

## 2023-04-28 DIAGNOSIS — M810 Age-related osteoporosis without current pathological fracture: Secondary | ICD-10-CM | POA: Diagnosis not present

## 2023-04-28 DIAGNOSIS — G250 Essential tremor: Secondary | ICD-10-CM | POA: Diagnosis not present

## 2023-04-28 DIAGNOSIS — F419 Anxiety disorder, unspecified: Secondary | ICD-10-CM | POA: Diagnosis not present

## 2023-04-28 DIAGNOSIS — J302 Other seasonal allergic rhinitis: Secondary | ICD-10-CM | POA: Diagnosis not present

## 2023-04-28 DIAGNOSIS — G894 Chronic pain syndrome: Secondary | ICD-10-CM | POA: Diagnosis not present

## 2023-04-28 DIAGNOSIS — Z471 Aftercare following joint replacement surgery: Secondary | ICD-10-CM | POA: Diagnosis not present

## 2023-04-28 DIAGNOSIS — E039 Hypothyroidism, unspecified: Secondary | ICD-10-CM | POA: Diagnosis not present

## 2023-04-28 DIAGNOSIS — Z79899 Other long term (current) drug therapy: Secondary | ICD-10-CM | POA: Diagnosis not present

## 2023-04-28 DIAGNOSIS — D696 Thrombocytopenia, unspecified: Secondary | ICD-10-CM | POA: Diagnosis not present

## 2023-04-28 DIAGNOSIS — E785 Hyperlipidemia, unspecified: Secondary | ICD-10-CM | POA: Diagnosis not present

## 2023-04-28 DIAGNOSIS — F32A Depression, unspecified: Secondary | ICD-10-CM | POA: Diagnosis not present

## 2023-04-28 DIAGNOSIS — E559 Vitamin D deficiency, unspecified: Secondary | ICD-10-CM | POA: Diagnosis not present

## 2023-04-28 DIAGNOSIS — M5416 Radiculopathy, lumbar region: Secondary | ICD-10-CM | POA: Diagnosis not present

## 2023-04-28 DIAGNOSIS — M51369 Other intervertebral disc degeneration, lumbar region without mention of lumbar back pain or lower extremity pain: Secondary | ICD-10-CM | POA: Diagnosis not present

## 2023-04-30 ENCOUNTER — Encounter: Payer: Medicare Other | Admitting: Nurse Practitioner

## 2023-05-02 DIAGNOSIS — M810 Age-related osteoporosis without current pathological fracture: Secondary | ICD-10-CM | POA: Diagnosis not present

## 2023-05-02 DIAGNOSIS — G250 Essential tremor: Secondary | ICD-10-CM | POA: Diagnosis not present

## 2023-05-02 DIAGNOSIS — J302 Other seasonal allergic rhinitis: Secondary | ICD-10-CM | POA: Diagnosis not present

## 2023-05-02 DIAGNOSIS — M51369 Other intervertebral disc degeneration, lumbar region without mention of lumbar back pain or lower extremity pain: Secondary | ICD-10-CM | POA: Diagnosis not present

## 2023-05-02 DIAGNOSIS — E785 Hyperlipidemia, unspecified: Secondary | ICD-10-CM | POA: Diagnosis not present

## 2023-05-02 DIAGNOSIS — F32A Depression, unspecified: Secondary | ICD-10-CM | POA: Diagnosis not present

## 2023-05-02 DIAGNOSIS — M5416 Radiculopathy, lumbar region: Secondary | ICD-10-CM | POA: Diagnosis not present

## 2023-05-02 DIAGNOSIS — E559 Vitamin D deficiency, unspecified: Secondary | ICD-10-CM | POA: Diagnosis not present

## 2023-05-02 DIAGNOSIS — Z79899 Other long term (current) drug therapy: Secondary | ICD-10-CM | POA: Diagnosis not present

## 2023-05-02 DIAGNOSIS — E039 Hypothyroidism, unspecified: Secondary | ICD-10-CM | POA: Diagnosis not present

## 2023-05-02 DIAGNOSIS — Z471 Aftercare following joint replacement surgery: Secondary | ICD-10-CM | POA: Diagnosis not present

## 2023-05-02 DIAGNOSIS — Z791 Long term (current) use of non-steroidal anti-inflammatories (NSAID): Secondary | ICD-10-CM | POA: Diagnosis not present

## 2023-05-02 DIAGNOSIS — D696 Thrombocytopenia, unspecified: Secondary | ICD-10-CM | POA: Diagnosis not present

## 2023-05-02 DIAGNOSIS — F419 Anxiety disorder, unspecified: Secondary | ICD-10-CM | POA: Diagnosis not present

## 2023-05-02 DIAGNOSIS — G894 Chronic pain syndrome: Secondary | ICD-10-CM | POA: Diagnosis not present

## 2023-05-02 DIAGNOSIS — Z7901 Long term (current) use of anticoagulants: Secondary | ICD-10-CM | POA: Diagnosis not present

## 2023-05-05 DIAGNOSIS — E559 Vitamin D deficiency, unspecified: Secondary | ICD-10-CM | POA: Diagnosis not present

## 2023-05-05 DIAGNOSIS — Z79899 Other long term (current) drug therapy: Secondary | ICD-10-CM | POA: Diagnosis not present

## 2023-05-05 DIAGNOSIS — F419 Anxiety disorder, unspecified: Secondary | ICD-10-CM | POA: Diagnosis not present

## 2023-05-05 DIAGNOSIS — Z471 Aftercare following joint replacement surgery: Secondary | ICD-10-CM | POA: Diagnosis not present

## 2023-05-05 DIAGNOSIS — M51369 Other intervertebral disc degeneration, lumbar region without mention of lumbar back pain or lower extremity pain: Secondary | ICD-10-CM | POA: Diagnosis not present

## 2023-05-05 DIAGNOSIS — E039 Hypothyroidism, unspecified: Secondary | ICD-10-CM | POA: Diagnosis not present

## 2023-05-05 DIAGNOSIS — E785 Hyperlipidemia, unspecified: Secondary | ICD-10-CM | POA: Diagnosis not present

## 2023-05-05 DIAGNOSIS — G894 Chronic pain syndrome: Secondary | ICD-10-CM | POA: Diagnosis not present

## 2023-05-05 DIAGNOSIS — M810 Age-related osteoporosis without current pathological fracture: Secondary | ICD-10-CM | POA: Diagnosis not present

## 2023-05-05 DIAGNOSIS — G250 Essential tremor: Secondary | ICD-10-CM | POA: Diagnosis not present

## 2023-05-05 DIAGNOSIS — D696 Thrombocytopenia, unspecified: Secondary | ICD-10-CM | POA: Diagnosis not present

## 2023-05-05 DIAGNOSIS — J302 Other seasonal allergic rhinitis: Secondary | ICD-10-CM | POA: Diagnosis not present

## 2023-05-05 DIAGNOSIS — Z7901 Long term (current) use of anticoagulants: Secondary | ICD-10-CM | POA: Diagnosis not present

## 2023-05-05 DIAGNOSIS — M5416 Radiculopathy, lumbar region: Secondary | ICD-10-CM | POA: Diagnosis not present

## 2023-05-05 DIAGNOSIS — F32A Depression, unspecified: Secondary | ICD-10-CM | POA: Diagnosis not present

## 2023-05-05 DIAGNOSIS — Z791 Long term (current) use of non-steroidal anti-inflammatories (NSAID): Secondary | ICD-10-CM | POA: Diagnosis not present

## 2023-05-09 DIAGNOSIS — G894 Chronic pain syndrome: Secondary | ICD-10-CM | POA: Diagnosis not present

## 2023-05-09 DIAGNOSIS — E559 Vitamin D deficiency, unspecified: Secondary | ICD-10-CM | POA: Diagnosis not present

## 2023-05-09 DIAGNOSIS — F32A Depression, unspecified: Secondary | ICD-10-CM | POA: Diagnosis not present

## 2023-05-09 DIAGNOSIS — G250 Essential tremor: Secondary | ICD-10-CM | POA: Diagnosis not present

## 2023-05-09 DIAGNOSIS — M810 Age-related osteoporosis without current pathological fracture: Secondary | ICD-10-CM | POA: Diagnosis not present

## 2023-05-09 DIAGNOSIS — Z471 Aftercare following joint replacement surgery: Secondary | ICD-10-CM | POA: Diagnosis not present

## 2023-05-09 DIAGNOSIS — J302 Other seasonal allergic rhinitis: Secondary | ICD-10-CM | POA: Diagnosis not present

## 2023-05-09 DIAGNOSIS — D696 Thrombocytopenia, unspecified: Secondary | ICD-10-CM | POA: Diagnosis not present

## 2023-05-09 DIAGNOSIS — M5416 Radiculopathy, lumbar region: Secondary | ICD-10-CM | POA: Diagnosis not present

## 2023-05-09 DIAGNOSIS — Z791 Long term (current) use of non-steroidal anti-inflammatories (NSAID): Secondary | ICD-10-CM | POA: Diagnosis not present

## 2023-05-09 DIAGNOSIS — E785 Hyperlipidemia, unspecified: Secondary | ICD-10-CM | POA: Diagnosis not present

## 2023-05-09 DIAGNOSIS — Z79899 Other long term (current) drug therapy: Secondary | ICD-10-CM | POA: Diagnosis not present

## 2023-05-09 DIAGNOSIS — Z7901 Long term (current) use of anticoagulants: Secondary | ICD-10-CM | POA: Diagnosis not present

## 2023-05-09 DIAGNOSIS — F419 Anxiety disorder, unspecified: Secondary | ICD-10-CM | POA: Diagnosis not present

## 2023-05-09 DIAGNOSIS — E039 Hypothyroidism, unspecified: Secondary | ICD-10-CM | POA: Diagnosis not present

## 2023-05-09 DIAGNOSIS — M51369 Other intervertebral disc degeneration, lumbar region without mention of lumbar back pain or lower extremity pain: Secondary | ICD-10-CM | POA: Diagnosis not present

## 2023-05-13 DIAGNOSIS — Z79899 Other long term (current) drug therapy: Secondary | ICD-10-CM | POA: Diagnosis not present

## 2023-05-13 DIAGNOSIS — G894 Chronic pain syndrome: Secondary | ICD-10-CM | POA: Diagnosis not present

## 2023-05-13 DIAGNOSIS — G250 Essential tremor: Secondary | ICD-10-CM | POA: Diagnosis not present

## 2023-05-13 DIAGNOSIS — Z7901 Long term (current) use of anticoagulants: Secondary | ICD-10-CM | POA: Diagnosis not present

## 2023-05-13 DIAGNOSIS — M5416 Radiculopathy, lumbar region: Secondary | ICD-10-CM | POA: Diagnosis not present

## 2023-05-13 DIAGNOSIS — F419 Anxiety disorder, unspecified: Secondary | ICD-10-CM | POA: Diagnosis not present

## 2023-05-13 DIAGNOSIS — D696 Thrombocytopenia, unspecified: Secondary | ICD-10-CM | POA: Diagnosis not present

## 2023-05-13 DIAGNOSIS — F32A Depression, unspecified: Secondary | ICD-10-CM | POA: Diagnosis not present

## 2023-05-13 DIAGNOSIS — E559 Vitamin D deficiency, unspecified: Secondary | ICD-10-CM | POA: Diagnosis not present

## 2023-05-13 DIAGNOSIS — Z791 Long term (current) use of non-steroidal anti-inflammatories (NSAID): Secondary | ICD-10-CM | POA: Diagnosis not present

## 2023-05-13 DIAGNOSIS — J302 Other seasonal allergic rhinitis: Secondary | ICD-10-CM | POA: Diagnosis not present

## 2023-05-13 DIAGNOSIS — Z471 Aftercare following joint replacement surgery: Secondary | ICD-10-CM | POA: Diagnosis not present

## 2023-05-13 DIAGNOSIS — E039 Hypothyroidism, unspecified: Secondary | ICD-10-CM | POA: Diagnosis not present

## 2023-05-13 DIAGNOSIS — E785 Hyperlipidemia, unspecified: Secondary | ICD-10-CM | POA: Diagnosis not present

## 2023-05-13 DIAGNOSIS — M810 Age-related osteoporosis without current pathological fracture: Secondary | ICD-10-CM | POA: Diagnosis not present

## 2023-05-13 DIAGNOSIS — M51369 Other intervertebral disc degeneration, lumbar region without mention of lumbar back pain or lower extremity pain: Secondary | ICD-10-CM | POA: Diagnosis not present

## 2023-05-16 DIAGNOSIS — J302 Other seasonal allergic rhinitis: Secondary | ICD-10-CM | POA: Diagnosis not present

## 2023-05-16 DIAGNOSIS — Z7901 Long term (current) use of anticoagulants: Secondary | ICD-10-CM | POA: Diagnosis not present

## 2023-05-16 DIAGNOSIS — E559 Vitamin D deficiency, unspecified: Secondary | ICD-10-CM | POA: Diagnosis not present

## 2023-05-16 DIAGNOSIS — M810 Age-related osteoporosis without current pathological fracture: Secondary | ICD-10-CM | POA: Diagnosis not present

## 2023-05-16 DIAGNOSIS — F32A Depression, unspecified: Secondary | ICD-10-CM | POA: Diagnosis not present

## 2023-05-16 DIAGNOSIS — Z79899 Other long term (current) drug therapy: Secondary | ICD-10-CM | POA: Diagnosis not present

## 2023-05-16 DIAGNOSIS — G250 Essential tremor: Secondary | ICD-10-CM | POA: Diagnosis not present

## 2023-05-16 DIAGNOSIS — E785 Hyperlipidemia, unspecified: Secondary | ICD-10-CM | POA: Diagnosis not present

## 2023-05-16 DIAGNOSIS — E039 Hypothyroidism, unspecified: Secondary | ICD-10-CM | POA: Diagnosis not present

## 2023-05-16 DIAGNOSIS — M51369 Other intervertebral disc degeneration, lumbar region without mention of lumbar back pain or lower extremity pain: Secondary | ICD-10-CM | POA: Diagnosis not present

## 2023-05-16 DIAGNOSIS — Z791 Long term (current) use of non-steroidal anti-inflammatories (NSAID): Secondary | ICD-10-CM | POA: Diagnosis not present

## 2023-05-16 DIAGNOSIS — G894 Chronic pain syndrome: Secondary | ICD-10-CM | POA: Diagnosis not present

## 2023-05-16 DIAGNOSIS — M5416 Radiculopathy, lumbar region: Secondary | ICD-10-CM | POA: Diagnosis not present

## 2023-05-16 DIAGNOSIS — D696 Thrombocytopenia, unspecified: Secondary | ICD-10-CM | POA: Diagnosis not present

## 2023-05-16 DIAGNOSIS — F419 Anxiety disorder, unspecified: Secondary | ICD-10-CM | POA: Diagnosis not present

## 2023-05-16 DIAGNOSIS — Z471 Aftercare following joint replacement surgery: Secondary | ICD-10-CM | POA: Diagnosis not present

## 2023-05-20 DIAGNOSIS — E785 Hyperlipidemia, unspecified: Secondary | ICD-10-CM | POA: Diagnosis not present

## 2023-05-20 DIAGNOSIS — M51369 Other intervertebral disc degeneration, lumbar region without mention of lumbar back pain or lower extremity pain: Secondary | ICD-10-CM | POA: Diagnosis not present

## 2023-05-20 DIAGNOSIS — G250 Essential tremor: Secondary | ICD-10-CM | POA: Diagnosis not present

## 2023-05-20 DIAGNOSIS — E039 Hypothyroidism, unspecified: Secondary | ICD-10-CM | POA: Diagnosis not present

## 2023-05-20 DIAGNOSIS — Z96641 Presence of right artificial hip joint: Secondary | ICD-10-CM | POA: Diagnosis not present

## 2023-05-20 DIAGNOSIS — J302 Other seasonal allergic rhinitis: Secondary | ICD-10-CM | POA: Diagnosis not present

## 2023-05-20 DIAGNOSIS — M5416 Radiculopathy, lumbar region: Secondary | ICD-10-CM | POA: Diagnosis not present

## 2023-05-20 DIAGNOSIS — G894 Chronic pain syndrome: Secondary | ICD-10-CM | POA: Diagnosis not present

## 2023-05-20 DIAGNOSIS — F419 Anxiety disorder, unspecified: Secondary | ICD-10-CM | POA: Diagnosis not present

## 2023-05-20 DIAGNOSIS — Z9181 History of falling: Secondary | ICD-10-CM | POA: Diagnosis not present

## 2023-05-20 DIAGNOSIS — Z471 Aftercare following joint replacement surgery: Secondary | ICD-10-CM | POA: Diagnosis not present

## 2023-05-20 DIAGNOSIS — Z79899 Other long term (current) drug therapy: Secondary | ICD-10-CM | POA: Diagnosis not present

## 2023-05-20 DIAGNOSIS — M810 Age-related osteoporosis without current pathological fracture: Secondary | ICD-10-CM | POA: Diagnosis not present

## 2023-05-20 DIAGNOSIS — F32A Depression, unspecified: Secondary | ICD-10-CM | POA: Diagnosis not present

## 2023-05-20 DIAGNOSIS — Z7901 Long term (current) use of anticoagulants: Secondary | ICD-10-CM | POA: Diagnosis not present

## 2023-05-20 DIAGNOSIS — Z853 Personal history of malignant neoplasm of breast: Secondary | ICD-10-CM | POA: Diagnosis not present

## 2023-05-20 DIAGNOSIS — Z791 Long term (current) use of non-steroidal anti-inflammatories (NSAID): Secondary | ICD-10-CM | POA: Diagnosis not present

## 2023-05-20 DIAGNOSIS — D696 Thrombocytopenia, unspecified: Secondary | ICD-10-CM | POA: Diagnosis not present

## 2023-05-20 DIAGNOSIS — E559 Vitamin D deficiency, unspecified: Secondary | ICD-10-CM | POA: Diagnosis not present

## 2023-05-26 DIAGNOSIS — F32A Depression, unspecified: Secondary | ICD-10-CM | POA: Diagnosis not present

## 2023-05-26 DIAGNOSIS — Z79899 Other long term (current) drug therapy: Secondary | ICD-10-CM | POA: Diagnosis not present

## 2023-05-26 DIAGNOSIS — Z9181 History of falling: Secondary | ICD-10-CM | POA: Diagnosis not present

## 2023-05-26 DIAGNOSIS — D696 Thrombocytopenia, unspecified: Secondary | ICD-10-CM | POA: Diagnosis not present

## 2023-05-26 DIAGNOSIS — Z96641 Presence of right artificial hip joint: Secondary | ICD-10-CM | POA: Diagnosis not present

## 2023-05-26 DIAGNOSIS — F419 Anxiety disorder, unspecified: Secondary | ICD-10-CM | POA: Diagnosis not present

## 2023-05-26 DIAGNOSIS — J302 Other seasonal allergic rhinitis: Secondary | ICD-10-CM | POA: Diagnosis not present

## 2023-05-26 DIAGNOSIS — Z791 Long term (current) use of non-steroidal anti-inflammatories (NSAID): Secondary | ICD-10-CM | POA: Diagnosis not present

## 2023-05-26 DIAGNOSIS — Z7901 Long term (current) use of anticoagulants: Secondary | ICD-10-CM | POA: Diagnosis not present

## 2023-05-26 DIAGNOSIS — Z853 Personal history of malignant neoplasm of breast: Secondary | ICD-10-CM | POA: Diagnosis not present

## 2023-05-26 DIAGNOSIS — M810 Age-related osteoporosis without current pathological fracture: Secondary | ICD-10-CM | POA: Diagnosis not present

## 2023-05-26 DIAGNOSIS — G250 Essential tremor: Secondary | ICD-10-CM | POA: Diagnosis not present

## 2023-05-26 DIAGNOSIS — G894 Chronic pain syndrome: Secondary | ICD-10-CM | POA: Diagnosis not present

## 2023-05-26 DIAGNOSIS — E039 Hypothyroidism, unspecified: Secondary | ICD-10-CM | POA: Diagnosis not present

## 2023-05-26 DIAGNOSIS — M5416 Radiculopathy, lumbar region: Secondary | ICD-10-CM | POA: Diagnosis not present

## 2023-05-26 DIAGNOSIS — E785 Hyperlipidemia, unspecified: Secondary | ICD-10-CM | POA: Diagnosis not present

## 2023-05-26 DIAGNOSIS — M51369 Other intervertebral disc degeneration, lumbar region without mention of lumbar back pain or lower extremity pain: Secondary | ICD-10-CM | POA: Diagnosis not present

## 2023-05-26 DIAGNOSIS — E559 Vitamin D deficiency, unspecified: Secondary | ICD-10-CM | POA: Diagnosis not present

## 2023-05-26 DIAGNOSIS — Z471 Aftercare following joint replacement surgery: Secondary | ICD-10-CM | POA: Diagnosis not present

## 2023-05-30 DIAGNOSIS — Z96641 Presence of right artificial hip joint: Secondary | ICD-10-CM | POA: Diagnosis not present

## 2023-06-02 DIAGNOSIS — M810 Age-related osteoporosis without current pathological fracture: Secondary | ICD-10-CM | POA: Diagnosis not present

## 2023-06-02 DIAGNOSIS — Z9181 History of falling: Secondary | ICD-10-CM | POA: Diagnosis not present

## 2023-06-02 DIAGNOSIS — Z96641 Presence of right artificial hip joint: Secondary | ICD-10-CM | POA: Diagnosis not present

## 2023-06-02 DIAGNOSIS — E039 Hypothyroidism, unspecified: Secondary | ICD-10-CM | POA: Diagnosis not present

## 2023-06-02 DIAGNOSIS — Z7901 Long term (current) use of anticoagulants: Secondary | ICD-10-CM | POA: Diagnosis not present

## 2023-06-02 DIAGNOSIS — Z471 Aftercare following joint replacement surgery: Secondary | ICD-10-CM | POA: Diagnosis not present

## 2023-06-02 DIAGNOSIS — M51369 Other intervertebral disc degeneration, lumbar region without mention of lumbar back pain or lower extremity pain: Secondary | ICD-10-CM | POA: Diagnosis not present

## 2023-06-02 DIAGNOSIS — Z853 Personal history of malignant neoplasm of breast: Secondary | ICD-10-CM | POA: Diagnosis not present

## 2023-06-02 DIAGNOSIS — E785 Hyperlipidemia, unspecified: Secondary | ICD-10-CM | POA: Diagnosis not present

## 2023-06-02 DIAGNOSIS — G250 Essential tremor: Secondary | ICD-10-CM | POA: Diagnosis not present

## 2023-06-02 DIAGNOSIS — G894 Chronic pain syndrome: Secondary | ICD-10-CM | POA: Diagnosis not present

## 2023-06-02 DIAGNOSIS — J302 Other seasonal allergic rhinitis: Secondary | ICD-10-CM | POA: Diagnosis not present

## 2023-06-02 DIAGNOSIS — M5416 Radiculopathy, lumbar region: Secondary | ICD-10-CM | POA: Diagnosis not present

## 2023-06-02 DIAGNOSIS — F419 Anxiety disorder, unspecified: Secondary | ICD-10-CM | POA: Diagnosis not present

## 2023-06-02 DIAGNOSIS — D696 Thrombocytopenia, unspecified: Secondary | ICD-10-CM | POA: Diagnosis not present

## 2023-06-02 DIAGNOSIS — E559 Vitamin D deficiency, unspecified: Secondary | ICD-10-CM | POA: Diagnosis not present

## 2023-06-02 DIAGNOSIS — Z79899 Other long term (current) drug therapy: Secondary | ICD-10-CM | POA: Diagnosis not present

## 2023-06-02 DIAGNOSIS — Z791 Long term (current) use of non-steroidal anti-inflammatories (NSAID): Secondary | ICD-10-CM | POA: Diagnosis not present

## 2023-06-02 DIAGNOSIS — F32A Depression, unspecified: Secondary | ICD-10-CM | POA: Diagnosis not present

## 2023-06-10 ENCOUNTER — Ambulatory Visit (INDEPENDENT_AMBULATORY_CARE_PROVIDER_SITE_OTHER): Admitting: Nurse Practitioner

## 2023-06-10 ENCOUNTER — Encounter: Payer: Self-pay | Admitting: Nurse Practitioner

## 2023-06-10 VITALS — BP 122/76 | HR 79 | Temp 97.9°F | Ht 63.0 in | Wt 143.6 lb

## 2023-06-10 DIAGNOSIS — Z96641 Presence of right artificial hip joint: Secondary | ICD-10-CM | POA: Diagnosis not present

## 2023-06-10 DIAGNOSIS — E039 Hypothyroidism, unspecified: Secondary | ICD-10-CM

## 2023-06-10 DIAGNOSIS — E785 Hyperlipidemia, unspecified: Secondary | ICD-10-CM | POA: Diagnosis not present

## 2023-06-10 DIAGNOSIS — F411 Generalized anxiety disorder: Secondary | ICD-10-CM | POA: Diagnosis not present

## 2023-06-10 DIAGNOSIS — G479 Sleep disorder, unspecified: Secondary | ICD-10-CM

## 2023-06-10 DIAGNOSIS — M810 Age-related osteoporosis without current pathological fracture: Secondary | ICD-10-CM

## 2023-06-10 NOTE — Progress Notes (Signed)
 Bluford Burkitt, NP-C Phone: [phone]  Jenny Roberts is a 78 y.o. female who presents today for transfer of care.   Discussed the use of AI scribe software for clinical note transcription with the patient, who gave verbal consent to proceed.  History of Present Illness   Jenny Roberts is a 78 year old female who presents for transfer of care.  She underwent a right hip replacement on April 17, 2023, and is currently receiving physical therapy at home, with one more week planned before reassessment. She notes significant improvement in pain and mobility since the surgery. A follow-up with her surgeon on May 30, 2023, showed good progress with imaging confirming positive results.  She is on Synthroid  for thyroid  management and experiences symptoms of feeling cold and increased hoarseness when her levels are off. She also has very dry skin despite using various remedies. Her thyroid  levels have not been checked since September 2024.  She takes Fosamax  for osteoporosis, Klonopin  and Zoloft  for anxiety, and lovastatin  for cholesterol management. She reports doing well on these medications without any abdominal pain or muscle aches. No heart palpitations are noted.      Social History   Tobacco Use  Smoking Status Never  Smokeless Tobacco Never    Current Outpatient Medications on File Prior to Visit  Medication Sig Dispense Refill   acetaminophen  (TYLENOL ) 500 MG tablet Take 1,000 mg by mouth every 6 (six) hours as needed (pain.).     alendronate  (FOSAMAX ) 70 MG tablet Take 1 tablet (70 mg total) by mouth every 7 (seven) days. Take with a full glass of water on an empty stomach. (Patient taking differently: Take 70 mg by mouth every Saturday. Take with a full glass of water on an empty stomach.) 12 tablet 3   Cholecalciferol  (QC VITAMIN D3) 50 MCG (2000 UT) CAPS Take 2 capsules by mouth daily.     gabapentin  (NEURONTIN ) 300 MG capsule Take 1 capsule (300 mg total) by mouth 2 (two) times  daily. (Patient taking differently: Take 600 mg by mouth at bedtime.) 60 capsule 3   Lidocaine  HCl (ICY HOT MAX LIDOCAINE ) 4 % LIQD Apply 1 Application topically 3 (three) times daily as needed (pain.).     lovastatin  (MEVACOR ) 40 MG tablet TAKE 1 TABLET(40 MG) BY MOUTH DAILY 90 tablet 1   No current facility-administered medications on file prior to visit.     ROS see history of present illness  Objective  Physical Exam Vitals:   06/10/23 1404  BP: 122/76  Pulse: 79  Temp: 97.9 F (36.6 C)  SpO2: 94%    BP Readings from Last 3 Encounters:  06/10/23 122/76  04/18/23 (!) 105/58  04/04/23 136/80   Wt Readings from Last 3 Encounters:  06/10/23 143 lb 9.6 oz (65.1 kg)  04/18/23 147 lb 12.8 oz (67 kg)  04/04/23 147 lb 12.8 oz (67 kg)    Physical Exam Constitutional:      General: She is not in acute distress.    Appearance: Normal appearance.  HENT:     Head: Normocephalic.  Cardiovascular:     Rate and Rhythm: Normal rate and regular rhythm.     Heart sounds: Normal heart sounds.  Pulmonary:     Effort: Pulmonary effort is normal.     Breath sounds: Normal breath sounds.  Skin:    General: Skin is warm and dry.  Neurological:     General: No focal deficit present.     Mental Status: She  is alert.  Psychiatric:        Mood and Affect: Mood normal.        Behavior: Behavior normal.      Assessment/Plan: Please see individual problem list.  Hypothyroidism, unspecified type Assessment & Plan: Managed with Synthroid  88 mcg daily. Last thyroid  check was in September last year. Check TSH today. We will adjust medication if necessary.   Orders: -     TSH  S/P total right hip arthroplasty Assessment & Plan: Recovery progressing well with home physical therapy. Pain improved. Continue PT. Follow up with Ortho as scheduled.    Age-related osteoporosis without current pathological fracture Assessment & Plan: Managed with Fosamax . No new symptoms or  complications. Continue Fosamax  70 mg weekly and vitamin D  supplementation.    Anxiety state Assessment & Plan: Well controlled with Klonopin  and Zoloft . Medications well tolerated. Continue Zoloft  100 mg daily and Klonopin  0.5 mg daily at bedtime as needed. Refills sent. PDMP reviewed.   Orders: -     Sertraline  HCl; Take 1 tablet (100 mg total) by mouth at bedtime.  Dispense: 90 tablet; Refill: 3 -     clonazePAM ; Take 1 tablet (0.5 mg total) by mouth at bedtime.  Dispense: 30 tablet; Refill: 5  Hyperlipidemia, unspecified hyperlipidemia type Assessment & Plan: Managed with lovastatin . No reported side effects. Continue lovastatin  40 mg daily. Check lipid panel today.   Orders: -     Comprehensive metabolic panel with GFR -     Lipid panel  Sleeping difficulty -     clonazePAM ; Take 1 tablet (0.5 mg total) by mouth at bedtime.  Dispense: 30 tablet; Refill: 5     Return in about 6 months (around 12/11/2023) for Follow up.   Bluford Burkitt, NP-C [facility]

## 2023-06-11 ENCOUNTER — Ambulatory Visit: Payer: Self-pay | Admitting: Nurse Practitioner

## 2023-06-11 ENCOUNTER — Other Ambulatory Visit: Payer: Self-pay | Admitting: Nurse Practitioner

## 2023-06-11 ENCOUNTER — Encounter: Payer: Self-pay | Admitting: Nurse Practitioner

## 2023-06-11 ENCOUNTER — Other Ambulatory Visit: Payer: Self-pay

## 2023-06-11 DIAGNOSIS — E039 Hypothyroidism, unspecified: Secondary | ICD-10-CM

## 2023-06-11 LAB — COMPREHENSIVE METABOLIC PANEL WITH GFR
ALT: 16 U/L (ref 0–35)
AST: 19 U/L (ref 0–37)
Albumin: 4.5 g/dL (ref 3.5–5.2)
Alkaline Phosphatase: 74 U/L (ref 39–117)
BUN: 22 mg/dL (ref 6–23)
CO2: 27 meq/L (ref 19–32)
Calcium: 9.4 mg/dL (ref 8.4–10.5)
Chloride: 107 meq/L (ref 96–112)
Creatinine, Ser: 0.8 mg/dL (ref 0.40–1.20)
GFR: 70.7 mL/min (ref >60.00)
Glucose, Bld: 91 mg/dL (ref 70–99)
Potassium: 4.7 meq/L (ref 3.5–5.1)
Sodium: 142 meq/L (ref 135–145)
Total Bilirubin: 0.3 mg/dL (ref 0.2–1.2)
Total Protein: 6.3 g/dL (ref 6.0–8.3)

## 2023-06-11 LAB — LIPID PANEL
Cholesterol: 135 mg/dL (ref 0–200)
HDL: 44.5 mg/dL (ref >39.00)
LDL Cholesterol: 68 mg/dL (ref 0–99)
NonHDL: 90.07
Total CHOL/HDL Ratio: 3
Triglycerides: 110 mg/dL (ref 0.0–149.0)
VLDL: 22 mg/dL (ref 0.0–40.0)

## 2023-06-11 LAB — TSH: TSH: 0.13 u[IU]/mL — ABNORMAL LOW (ref 0.35–5.50)

## 2023-06-11 MED ORDER — SERTRALINE HCL 100 MG PO TABS: 100.0000 mg | ORAL_TABLET | Freq: Every day | ORAL | 3 refills | Status: DC

## 2023-06-11 MED ORDER — CLONAZEPAM 0.5 MG PO TABS: 0.5000 mg | ORAL_TABLET | Freq: Every day | ORAL | 5 refills | Status: DC

## 2023-06-11 MED ORDER — LEVOTHYROXINE SODIUM 75 MCG PO TABS: 75.0000 ug | ORAL_TABLET | Freq: Every day | ORAL | 0 refills | Status: DC

## 2023-06-11 NOTE — Assessment & Plan Note (Signed)
 Managed with lovastatin . No reported side effects. Continue lovastatin  40 mg daily. Check lipid panel today.

## 2023-06-11 NOTE — Assessment & Plan Note (Signed)
 Managed with Fosamax . No new symptoms or complications. Continue Fosamax  70 mg weekly and vitamin D  supplementation.

## 2023-06-11 NOTE — Assessment & Plan Note (Signed)
 Recovery progressing well with home physical therapy. Pain improved. Continue PT. Follow up with Ortho as scheduled.

## 2023-06-11 NOTE — Assessment & Plan Note (Signed)
 Well controlled with Klonopin  and Zoloft . Medications well tolerated. Continue Zoloft  100 mg daily and Klonopin  0.5 mg daily at bedtime as needed. Refills sent. PDMP reviewed.

## 2023-06-11 NOTE — Assessment & Plan Note (Addendum)
 Managed with Synthroid  88 mcg daily. Last thyroid  check was in September last year. Check TSH today. We will adjust medication if necessary.

## 2023-06-13 DIAGNOSIS — F32A Depression, unspecified: Secondary | ICD-10-CM | POA: Diagnosis not present

## 2023-06-13 DIAGNOSIS — E039 Hypothyroidism, unspecified: Secondary | ICD-10-CM | POA: Diagnosis not present

## 2023-06-13 DIAGNOSIS — Z791 Long term (current) use of non-steroidal anti-inflammatories (NSAID): Secondary | ICD-10-CM | POA: Diagnosis not present

## 2023-06-13 DIAGNOSIS — M51369 Other intervertebral disc degeneration, lumbar region without mention of lumbar back pain or lower extremity pain: Secondary | ICD-10-CM | POA: Diagnosis not present

## 2023-06-13 DIAGNOSIS — Z79899 Other long term (current) drug therapy: Secondary | ICD-10-CM | POA: Diagnosis not present

## 2023-06-13 DIAGNOSIS — G250 Essential tremor: Secondary | ICD-10-CM | POA: Diagnosis not present

## 2023-06-13 DIAGNOSIS — Z7901 Long term (current) use of anticoagulants: Secondary | ICD-10-CM | POA: Diagnosis not present

## 2023-06-13 DIAGNOSIS — J302 Other seasonal allergic rhinitis: Secondary | ICD-10-CM | POA: Diagnosis not present

## 2023-06-13 DIAGNOSIS — M5416 Radiculopathy, lumbar region: Secondary | ICD-10-CM | POA: Diagnosis not present

## 2023-06-13 DIAGNOSIS — M810 Age-related osteoporosis without current pathological fracture: Secondary | ICD-10-CM | POA: Diagnosis not present

## 2023-06-13 DIAGNOSIS — Z471 Aftercare following joint replacement surgery: Secondary | ICD-10-CM | POA: Diagnosis not present

## 2023-06-13 DIAGNOSIS — Z9181 History of falling: Secondary | ICD-10-CM | POA: Diagnosis not present

## 2023-06-13 DIAGNOSIS — D696 Thrombocytopenia, unspecified: Secondary | ICD-10-CM | POA: Diagnosis not present

## 2023-06-13 DIAGNOSIS — G894 Chronic pain syndrome: Secondary | ICD-10-CM | POA: Diagnosis not present

## 2023-06-13 DIAGNOSIS — E559 Vitamin D deficiency, unspecified: Secondary | ICD-10-CM | POA: Diagnosis not present

## 2023-06-13 DIAGNOSIS — Z853 Personal history of malignant neoplasm of breast: Secondary | ICD-10-CM | POA: Diagnosis not present

## 2023-06-13 DIAGNOSIS — E785 Hyperlipidemia, unspecified: Secondary | ICD-10-CM | POA: Diagnosis not present

## 2023-06-13 DIAGNOSIS — F419 Anxiety disorder, unspecified: Secondary | ICD-10-CM | POA: Diagnosis not present

## 2023-06-13 DIAGNOSIS — Z96641 Presence of right artificial hip joint: Secondary | ICD-10-CM | POA: Diagnosis not present

## 2023-06-17 DIAGNOSIS — F32A Depression, unspecified: Secondary | ICD-10-CM | POA: Diagnosis not present

## 2023-06-17 DIAGNOSIS — E559 Vitamin D deficiency, unspecified: Secondary | ICD-10-CM | POA: Diagnosis not present

## 2023-06-17 DIAGNOSIS — Z96641 Presence of right artificial hip joint: Secondary | ICD-10-CM | POA: Diagnosis not present

## 2023-06-17 DIAGNOSIS — Z471 Aftercare following joint replacement surgery: Secondary | ICD-10-CM | POA: Diagnosis not present

## 2023-06-17 DIAGNOSIS — E039 Hypothyroidism, unspecified: Secondary | ICD-10-CM | POA: Diagnosis not present

## 2023-06-17 DIAGNOSIS — M51369 Other intervertebral disc degeneration, lumbar region without mention of lumbar back pain or lower extremity pain: Secondary | ICD-10-CM | POA: Diagnosis not present

## 2023-06-17 DIAGNOSIS — D696 Thrombocytopenia, unspecified: Secondary | ICD-10-CM | POA: Diagnosis not present

## 2023-06-17 DIAGNOSIS — J302 Other seasonal allergic rhinitis: Secondary | ICD-10-CM | POA: Diagnosis not present

## 2023-06-17 DIAGNOSIS — E785 Hyperlipidemia, unspecified: Secondary | ICD-10-CM | POA: Diagnosis not present

## 2023-06-17 DIAGNOSIS — Z79899 Other long term (current) drug therapy: Secondary | ICD-10-CM | POA: Diagnosis not present

## 2023-06-17 DIAGNOSIS — Z853 Personal history of malignant neoplasm of breast: Secondary | ICD-10-CM | POA: Diagnosis not present

## 2023-06-17 DIAGNOSIS — M5416 Radiculopathy, lumbar region: Secondary | ICD-10-CM | POA: Diagnosis not present

## 2023-06-17 DIAGNOSIS — Z791 Long term (current) use of non-steroidal anti-inflammatories (NSAID): Secondary | ICD-10-CM | POA: Diagnosis not present

## 2023-06-17 DIAGNOSIS — G250 Essential tremor: Secondary | ICD-10-CM | POA: Diagnosis not present

## 2023-06-17 DIAGNOSIS — F419 Anxiety disorder, unspecified: Secondary | ICD-10-CM | POA: Diagnosis not present

## 2023-06-17 DIAGNOSIS — Z9181 History of falling: Secondary | ICD-10-CM | POA: Diagnosis not present

## 2023-06-17 DIAGNOSIS — G894 Chronic pain syndrome: Secondary | ICD-10-CM | POA: Diagnosis not present

## 2023-06-17 DIAGNOSIS — Z7901 Long term (current) use of anticoagulants: Secondary | ICD-10-CM | POA: Diagnosis not present

## 2023-06-17 DIAGNOSIS — M810 Age-related osteoporosis without current pathological fracture: Secondary | ICD-10-CM | POA: Diagnosis not present

## 2023-06-27 ENCOUNTER — Ambulatory Visit: Admitting: Nurse Practitioner

## 2023-07-01 ENCOUNTER — Other Ambulatory Visit: Payer: Self-pay | Admitting: Nurse Practitioner

## 2023-07-01 DIAGNOSIS — F411 Generalized anxiety disorder: Secondary | ICD-10-CM

## 2023-07-01 DIAGNOSIS — G479 Sleep disorder, unspecified: Secondary | ICD-10-CM

## 2023-07-03 NOTE — Telephone Encounter (Signed)
 Called and spoke to pt and informed her she should be able to contact her pharmacy and she stated she would I informed her that if she called and theys atted they do not see that script that she can call us  and provider can resend it to the pharmacy.

## 2023-07-15 ENCOUNTER — Telehealth: Payer: Self-pay | Admitting: Nurse Practitioner

## 2023-07-15 DIAGNOSIS — G8929 Other chronic pain: Secondary | ICD-10-CM

## 2023-07-15 DIAGNOSIS — M5416 Radiculopathy, lumbar region: Secondary | ICD-10-CM

## 2023-07-15 DIAGNOSIS — G894 Chronic pain syndrome: Secondary | ICD-10-CM

## 2023-07-15 MED ORDER — GABAPENTIN 300 MG PO CAPS: 600.0000 mg | ORAL_CAPSULE | Freq: Every day | ORAL | 0 refills | Status: DC

## 2023-07-15 NOTE — Telephone Encounter (Signed)
 Copied from CRM [id number]. Topic: Clinical - Medication Refill >> Jul 15, 2023 12:46 PM Adrionna Y wrote: Medication: gabapentin  (NEURONTIN ) 300 MG capsule   Has the patient contacted their pharmacy? Yes (Agent: If no, request that the patient contact the pharmacy for the refill. If patient does not wish to contact the pharmacy document the reason why and proceed with request.) (Agent: If yes, when and what did the pharmacy advise?)  This is the patient's preferred pharmacy:  [id number] - [place], [place] - [address] AT [facility] [address] [facility] [address] [place] [address] Phone: [phone] Fax: [phone]  Is this the correct pharmacy for this prescription? Yes If no, delete pharmacy and type the correct one.   Has the prescription been filled recently? No  Is the patient out of the medication? Yes  Has the patient been seen for an appointment in the last year OR does the patient have an upcoming appointment? Yes  Can we respond through MyChart? No  Agent: Please be advised that Rx refills may take up to 3 business days. We ask that you follow-up with your pharmacy.

## 2023-08-11 ENCOUNTER — Other Ambulatory Visit (INDEPENDENT_AMBULATORY_CARE_PROVIDER_SITE_OTHER)

## 2023-08-11 DIAGNOSIS — E039 Hypothyroidism, unspecified: Secondary | ICD-10-CM

## 2023-08-11 LAB — TSH: TSH: 6.29 u[IU]/mL — ABNORMAL HIGH (ref 0.35–5.50)

## 2023-08-13 ENCOUNTER — Ambulatory Visit: Payer: Self-pay | Admitting: Nurse Practitioner

## 2023-08-13 DIAGNOSIS — E039 Hypothyroidism, unspecified: Secondary | ICD-10-CM

## 2023-09-08 ENCOUNTER — Encounter: Payer: Self-pay | Admitting: Nurse Practitioner

## 2023-09-08 ENCOUNTER — Other Ambulatory Visit: Payer: Self-pay

## 2023-09-08 ENCOUNTER — Ambulatory Visit (INDEPENDENT_AMBULATORY_CARE_PROVIDER_SITE_OTHER): Payer: Medicare Other

## 2023-09-08 VITALS — Ht 63.0 in | Wt 140.0 lb

## 2023-09-08 DIAGNOSIS — Z0000 Encounter for general adult medical examination without abnormal findings: Secondary | ICD-10-CM | POA: Diagnosis not present

## 2023-09-08 DIAGNOSIS — M810 Age-related osteoporosis without current pathological fracture: Secondary | ICD-10-CM

## 2023-09-08 NOTE — Progress Notes (Signed)
 Subjective:   Jenny Roberts is a 78 y.o. who presents for a Medicare Wellness preventive visit.  As a reminder, Annual Wellness Visits don't include a physical exam, and some assessments may be limited, especially if this visit is performed virtually. We may recommend an in-person follow-up visit with your provider if needed.  Visit Complete: Virtual I connected with  Jenny Roberts on 09/08/23 by a audio enabled telemedicine application and verified that I am speaking with the correct person using two identifiers.  Patient Location: Home  Provider Location: Home Office  I discussed the limitations of evaluation and management by telemedicine. The patient expressed understanding and agreed to proceed.  Vital Signs: Because this visit was a virtual/telehealth visit, some criteria may be missing or patient reported. Any vitals not documented were not able to be obtained and vitals that have been documented are patient reported.  VideoDeclined- This patient declined Librarian, academic. Therefore the visit was completed with audio only.  Persons Participating in Visit: Patient.  AWV Questionnaire: No: Patient Medicare AWV questionnaire was not completed prior to this visit.  Cardiac Risk Factors include: advanced age (>31men, >66 women);dyslipidemia     Objective:    Today's Vitals   09/08/23 0935  Weight: 140 lb (63.5 kg)  Height: 5' 3 (1.6 m)   Body mass index is 24.8 kg/m.     09/08/2023    9:46 AM 04/17/2023    6:26 AM 02/26/2023    9:39 AM 09/04/2022    1:29 PM 06/09/2022   10:41 PM 06/09/2022    5:22 PM 05/16/2022    8:00 AM  Advanced Directives  Does Patient Have a Medical Advance Directive? Yes No No No No No No  Type of Estate agent of [place];Living will        Copy of Healthcare Power of Attorney in Chart? No - copy requested        Would patient like information on creating a medical advance directive?  No -  Patient declined  No - Patient declined No - Patient declined No - Patient declined No - Patient declined    Current Medications (verified) Outpatient Encounter Medications as of 09/08/2023  Medication Sig   acetaminophen  (TYLENOL ) 500 MG tablet Take 1,000 mg by mouth every 6 (six) hours as needed (pain.).   alendronate  (FOSAMAX ) 70 MG tablet Take 1 tablet (70 mg total) by mouth every 7 (seven) days. Take with a full glass of water on an empty stomach.   Cholecalciferol  (QC VITAMIN D3) 50 MCG (2000 UT) CAPS Take 2 capsules by mouth daily.   clonazePAM  (KLONOPIN ) 0.5 MG tablet Take 1 tablet (0.5 mg total) by mouth at bedtime.   gabapentin  (NEURONTIN ) 300 MG capsule Take 2 capsules (600 mg total) by mouth at bedtime.   levothyroxine  (SYNTHROID ) 75 MCG tablet Take 1 tablet (75 mcg total) by mouth daily before breakfast.   Lidocaine  HCl (ICY HOT MAX LIDOCAINE ) 4 % LIQD Apply 1 Application topically 3 (three) times daily as needed (pain.).   lovastatin  (MEVACOR ) 40 MG tablet TAKE 1 TABLET(40 MG) BY MOUTH DAILY   sertraline  (ZOLOFT ) 100 MG tablet Take 1 tablet (100 mg total) by mouth at bedtime.   No facility-administered encounter medications on file as of 09/08/2023.    Allergies (verified) Amoxicillin-pot clavulanate, Codeine, and Latex   History: Past Medical History:  Diagnosis Date   Age related osteoporosis    Anxiety    Breast cancer (HCC) 2001  Right. Treated with lumpectomy followed by Chemotherapy and Radiation therapy   COVID-19 03/06/2023   DDD (degenerative disc disease), lumbar    Depression    Hyperlipidemia    Hypothyroidism    Lumbar stenosis with neurogenic claudication    Nontraumatic rupture of tendon of thumb 06/15/2014   Personal history of chemotherapy    Personal history of radiation therapy    PONV (postoperative nausea and vomiting)    Recurrent UTI    Right forearm fracture 05/22/2020   Sepsis secondary to UTI (HCC) 04/2022   Skin cancer 09/2004    treated at [facility]   Thrombocytopenia (HCC)    Vitamin D  deficiency    Past Surgical History:  Procedure Laterality Date   ABDOMINAL HYSTERECTOMY  1983   BREAST BIOPSY Right 2001   positive   BREAST EXCISIONAL BIOPSY Right 1990's   benign   BREAST LUMPECTOMY Right 08/16/1999   Metaplastic carcinoma, grade 3; 1.7 cm, T1c, N0; 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  Focal positive posterior margin.  0/5 lymph nodes.   CARPAL TUNNEL RELEASE Right 2016   CATARACT EXTRACTION W/ INTRAOCULAR LENS  IMPLANT, BILATERAL Bilateral 2024   COLONOSCOPY  2008   COLONOSCOPY WITH PROPOFOL  N/A 02/16/2018   Procedure: COLONOSCOPY WITH PROPOFOL ;  Surgeon: Therisa Bi, MD;  Location: [facility] ENDOSCOPY;  Service: Endoscopy;  Laterality: N/A;   HARDWARE REMOVAL Right    plates removed from wrist and put in thumb screw   MOHS SURGERY Right 2006   Basal cell under right eye   PATELLA FRACTURE SURGERY Left 2003   wires in place   TOTAL HIP ARTHROPLASTY Right 04/17/2023   Procedure: ARTHROPLASTY, HIP, TOTAL,POSTERIOR APPROACH;  Surgeon: Lorelle Hussar, MD;  Location: [facility] ORS;  Service: Orthopedics;  Laterality: Right;   WRIST FRACTURE SURGERY Right 2010   WRIST FRACTURE SURGERY Right 2022   required larger plate/screws due to bone loss   Family History  Problem Relation Age of Onset   Brain cancer Father    Cancer Maternal Grandfather 27       Colon Cancer   Breast cancer Neg Hx    Social History   Socioeconomic History   Marital status: Married    Spouse name: Gaile   Number of children: 2   Years of education: Not on file   Highest education level: Not on file  Occupational History   Not on file  Tobacco Use   Smoking status: Never   Smokeless tobacco: Never  Vaping Use   Vaping status: Never Used  Substance and Sexual Activity   Alcohol use: No    Alcohol/week: 0.0 standard drinks of alcohol   Drug use: No   Sexual activity: Not Currently  Other Topics Concern   Not on file  Social  History Narrative   She is a homemaker and babysits   Children- 2 daughters    8 grandchildren, 3 great grandchildren    Pets: None   Caffeine- Decaf coffee, rare Dr. Nunzio    Enjoys- gardening, cross stitch, and quilt   Social Drivers of Health   Financial Resource Strain: Low Risk  (09/08/2023)   Overall Financial Resource Strain (CARDIA)    Difficulty of Paying Living Expenses: Not hard at all  Food Insecurity: No Food Insecurity (09/08/2023)   Hunger Vital Sign    Worried About Running Out of Food in the Last Year: Never true    Ran Out of Food in the Last Year: Never true  Transportation Needs: No Transportation  Needs (09/08/2023)   PRAPARE - Administrator, Civil Service (Medical): No    Lack of Transportation (Non-Medical): No  Physical Activity: Inactive (09/08/2023)   Exercise Vital Sign    Days of Exercise per Week: 0 days    Minutes of Exercise per Session: 0 min  Stress: No Stress Concern Present (09/08/2023)   [employer] of Occupational Health - Occupational Stress Questionnaire    Feeling of Stress: Only a little  Social Connections: Socially Integrated (09/08/2023)   Social Connection and Isolation Panel    Frequency of Communication with Friends and Family: More than three times a week    Frequency of Social Gatherings with Friends and Family: More than three times a week    Attends Religious Services: More than 4 times per year    Active Member of [employer] or Organizations: Yes    Attends Engineer, structural: More than 4 times per year    Marital Status: Married    Tobacco Counseling Counseling given: Not Answered    Clinical Intake:  Pre-visit preparation completed: Yes  Pain : No/denies pain     BMI - recorded: 24.8 Nutritional Status: BMI of 19-24  Normal Nutritional Risks: None Diabetes: No  Lab Results  Component Value Date   HGBA1C 5.3 02/19/2023   HGBA1C 6.0 09/03/2022   HGBA1C 5.2 02/28/2017     How often do  you need to have someone help you when you read instructions, pamphlets, or other written materials from your doctor or pharmacy?: 1 - Never  Interpreter Needed?: No  Information entered by :: R. Lina Hitch LPN   Activities of Daily Living     09/08/2023    9:36 AM 04/17/2023   11:00 AM  In your present state of health, do you have any difficulty performing the following activities:  Hearing? 0 0  Vision? 0 0  Difficulty concentrating or making decisions? 0 0  Walking or climbing stairs? 0   Dressing or bathing? 0   Doing errands, shopping? 0 0  Preparing Food and eating ? N   Using the Toilet? N   In the past six months, have you accidently leaked urine? N   Do you have problems with loss of bowel control? N   Managing your Medications? N   Managing your Finances? N   Housekeeping or managing your Housekeeping? N     Patient Care Team: Gretel App, NP as PCP - General (Nurse Practitioner) Dellie Louanne MATSU, MD (General Surgery) Therisa Bi, MD as Consulting Physician (Gastroenterology) Charlene Debby BROCKS, PA-C as Physician Assistant (Orthopedic Surgery)  I have updated your Care Teams any recent Medical Services you may have received from other providers in the past year.     Assessment:   This is a routine wellness examination for Carleena.  Hearing/Vision screen Hearing Screening - Comments:: No issues Vision Screening - Comments:: glasses   Goals Addressed             This Visit's Progress    Patient Stated       Wants to stay active and continue to interact with her grandchildren       Depression Screen     09/08/2023    9:41 AM 06/10/2023    2:07 PM 04/04/2023   10:10 AM 02/19/2023   10:03 AM 12/04/2022   10:05 AM 09/04/2022    1:22 PM 09/03/2022   10:36 AM  PHQ 2/9 Scores  PHQ - 2 Score 0 0 0  0 0 0 0  PHQ- 9 Score 0 0 1 1 2  0     Fall Risk     09/08/2023    9:38 AM 06/10/2023    2:07 PM 02/19/2023   10:02 AM 12/04/2022   10:05 AM 09/04/2022    1:19  PM  Fall Risk   Falls in the past year? 1 0 0 0 1  Number falls in past yr: 0 0 0 0 0  Injury with Fall? 0 0 0 0 0  Risk for fall due to : History of fall(s);Impaired balance/gait No Fall Risks No Fall Risks No Fall Risks History of fall(s);Impaired balance/gait  Follow up Falls evaluation completed;Falls prevention discussed Falls evaluation completed Falls evaluation completed;Education provided Falls evaluation completed Falls evaluation completed;Falls prevention discussed    MEDICARE RISK AT HOME:  Medicare Risk at Home Any stairs in or around the home?: Yes If so, are there any without handrails?: No Home free of loose throw rugs in walkways, pet beds, electrical cords, etc?: Yes Adequate lighting in your home to reduce risk of falls?: Yes Life alert?: No Use of a cane, walker or w/c?: No Grab bars in the bathroom?: No Shower chair or bench in shower?: Yes Elevated toilet seat or a handicapped toilet?: Yes  TIMED UP AND GO:  Was the test performed?  No  Cognitive Function: 6CIT completed    08/18/2017    3:22 PM 08/16/2016    3:56 PM 07/10/2015    3:48 PM  MMSE - Mini Mental State Exam  Orientation to time 5 5  5    Orientation to Place 5 5  5    Registration 3 3  3    Attention/ Calculation 5 5  5    Recall 3 3  3    Language- name 2 objects 2 2  2    Language- repeat 1 1 1   Language- follow 3 step command 3 3  3    Language- read & follow direction 1 1  1    Write a sentence 1 1  1    Copy design 1 1  1    Total score 30 30  30       Data saved with a previous flowsheet row definition        09/08/2023    9:46 AM 09/04/2022    1:31 PM 08/24/2020   10:43 AM 08/21/2018    1:47 PM  6CIT Screen  What Year? 0 points 0 points 0 points 0 points  What month? 0 points 0 points 0 points 0 points  What time? 0 points 0 points 0 points 0 points  Count back from 20 0 points 0 points 0 points 0 points  Months in reverse 0 points 0 points 0 points 0 points  Repeat phrase 0 points 0  points 0 points 0 points  Total Score 0 points 0 points 0 points 0 points    Immunizations Immunization History  Administered Date(s) Administered   Fluad Quad(high Dose 65+) 09/22/2018, 11/23/2019, 01/09/2021, 10/10/2021   Influenza, High Dose Seasonal PF 10/06/2015, 02/28/2017, 12/26/2017   Influenza-Unspecified 11/07/2014   PFIZER(Purple Top)SARS-COV-2 Vaccination 02/27/2019, 03/20/2019, 11/01/2019   Pneumococcal Conjugate-13 07/10/2015   Pneumococcal Polysaccharide-23 08/16/2016   Tdap 04/21/2020    Screening Tests Health Maintenance  Topic Date Due   Zoster Vaccines- Shingrix (1 of 2) Never done   COVID-19 Vaccine (4 - 2024-25 season) 09/22/2022   Medicare Annual Wellness (AWV)  09/04/2023   INFLUENZA VACCINE  08/22/2023   MAMMOGRAM  12/13/2023  DTaP/Tdap/Td (2 - Td or Tdap) 04/22/2030   Pneumococcal Vaccine: 50+ Years  Completed   DEXA SCAN  Completed   Hepatitis C Screening  Completed   HPV VACCINES  Aged Out   Meningococcal B Vaccine  Aged Out   Pneumococcal Vaccine  Discontinued   Colonoscopy  Discontinued    Health Maintenance  Health Maintenance Due  Topic Date Due   Zoster Vaccines- Shingrix (1 of 2) Never done   COVID-19 Vaccine (4 - 2024-25 season) 09/22/2022   Medicare Annual Wellness (AWV)  09/04/2023   INFLUENZA VACCINE  08/22/2023   Health Maintenance Items Addressed: Discussed the need to update flu, covid and shingles vaccines.  Patient stated that she had a Dexa prior to her having her hip surgery 03/2023  Additional Screening:  Vision Screening: Recommended annual ophthalmology exams for early detection of glaucoma and other disorders of the eye. Up to date Dr. Carolee Would you like a referral to an eye doctor? No    Dental Screening: Recommended annual dental exams for proper oral hygiene  Community Resource Referral / Chronic Care Management: CRR required this visit?  No   CCM required this visit?  No   Plan:    I have personally  reviewed and noted the following in the patient's chart:   Medical and social history Use of alcohol, tobacco or illicit drugs  Current medications and supplements including opioid prescriptions. Patient is not currently taking opioid prescriptions. Functional ability and status Nutritional status Physical activity Advanced directives List of other physicians Hospitalizations, surgeries, and ER visits in previous 12 months Vitals Screenings to include cognitive, depression, and falls Referrals and appointments  In addition, I have reviewed and discussed with patient certain preventive protocols, quality metrics, and best practice recommendations. A written personalized care plan for preventive services as well as general preventive health recommendations were provided to patient.   Angeline Fredericks, LPN   1/81/7974   After Visit Summary: (MyChart) Due to this being a telephonic visit, the after visit summary with patients personalized plan was offered to patient via MyChart   Notes: Nothing significant to report at this time.

## 2023-09-08 NOTE — Patient Instructions (Signed)
 Jenny Roberts , Thank you for taking time out of your busy schedule to complete your Annual Wellness Visit with me. I enjoyed our conversation and look forward to speaking with you again next year. I, as well as your care team,  appreciate your ongoing commitment to your health goals. Please review the following plan we discussed and let me know if I can assist you in the future. Your Game plan/ To Do List    Referrals: If you haven't heard from the office you've been referred to, please reach out to them at the phone provided.  Remember to update your flu, covid and shingles vaccines as discussed.  Follow up Visits: We will see or speak with you next year for your Next Medicare AWV with our clinical staff 09/08/24 @ 11:30 Have you seen your provider in the last 6 months (3 months if uncontrolled diabetes)? Yes  Clinician Recommendations:  Aim for 30 minutes of exercise or brisk walking, 6-8 glasses of water, and 5 servings of fruits and vegetables each day.       This is a list of the screenings recommended for you:  Health Maintenance  Topic Date Due   Zoster (Shingles) Vaccine (1 of 2) Never done   COVID-19 Vaccine (5 - Pfizer risk 2024-25 season) 05/08/2023   Flu Shot  08/22/2023   Mammogram  12/13/2023   Medicare Annual Wellness Visit  09/07/2024   DTaP/Tdap/Td vaccine (2 - Td or Tdap) 04/22/2030   Pneumococcal Vaccine for age over 91  Completed   DEXA scan (bone density measurement)  Completed   Hepatitis C Screening  Completed   HPV Vaccine  Aged Out   Meningitis B Vaccine  Aged Out   Pneumococcal Vaccine  Discontinued   Colon Cancer Screening  Discontinued    Advanced directives: (Copy Requested) Please bring a copy of your health care power of attorney and living will to the office to be added to your chart at your convenience. You can mail to [facility] [address] [place], [place] [address] or email to [email or web address] Advance Care Planning is  important because it:  [x]  Makes sure you receive the medical care that is consistent with your values, goals, and preferences  [x]  It provides guidance to your family and loved ones and reduces their decisional burden about whether or not they are making the right decisions based on your wishes.

## 2023-09-10 ENCOUNTER — Other Ambulatory Visit: Payer: Self-pay

## 2023-09-10 DIAGNOSIS — M810 Age-related osteoporosis without current pathological fracture: Secondary | ICD-10-CM

## 2023-09-10 MED ORDER — ALENDRONATE SODIUM 70 MG PO TABS
70.0000 mg | ORAL_TABLET | ORAL | 3 refills | Status: DC
Start: 2023-09-10 — End: 2023-12-10

## 2023-09-24 ENCOUNTER — Ambulatory Visit: Payer: Self-pay | Admitting: Nurse Practitioner

## 2023-09-24 ENCOUNTER — Other Ambulatory Visit (INDEPENDENT_AMBULATORY_CARE_PROVIDER_SITE_OTHER)

## 2023-09-24 DIAGNOSIS — E039 Hypothyroidism, unspecified: Secondary | ICD-10-CM

## 2023-09-24 DIAGNOSIS — Z96641 Presence of right artificial hip joint: Secondary | ICD-10-CM | POA: Diagnosis not present

## 2023-09-24 LAB — TSH: TSH: 9.38 u[IU]/mL — ABNORMAL HIGH (ref 0.35–5.50)

## 2023-09-24 MED ORDER — LEVOTHYROXINE SODIUM 88 MCG PO TABS: 88.0000 ug | ORAL_TABLET | Freq: Every day | ORAL | 0 refills | Status: DC

## 2023-09-26 ENCOUNTER — Other Ambulatory Visit: Payer: Self-pay | Admitting: Nurse Practitioner

## 2023-09-26 DIAGNOSIS — G894 Chronic pain syndrome: Secondary | ICD-10-CM

## 2023-09-26 DIAGNOSIS — G8929 Other chronic pain: Secondary | ICD-10-CM

## 2023-09-26 DIAGNOSIS — M5416 Radiculopathy, lumbar region: Secondary | ICD-10-CM

## 2023-11-17 ENCOUNTER — Other Ambulatory Visit: Payer: Self-pay | Admitting: Nurse Practitioner

## 2023-11-17 DIAGNOSIS — Z1231 Encounter for screening mammogram for malignant neoplasm of breast: Secondary | ICD-10-CM

## 2023-12-10 ENCOUNTER — Ambulatory Visit: Admitting: Nurse Practitioner

## 2023-12-10 VITALS — BP 120/84 | HR 70 | Temp 98.4°F | Ht 63.0 in | Wt 139.4 lb

## 2023-12-10 DIAGNOSIS — F411 Generalized anxiety disorder: Secondary | ICD-10-CM | POA: Diagnosis not present

## 2023-12-10 DIAGNOSIS — G479 Sleep disorder, unspecified: Secondary | ICD-10-CM

## 2023-12-10 DIAGNOSIS — M810 Age-related osteoporosis without current pathological fracture: Secondary | ICD-10-CM

## 2023-12-10 DIAGNOSIS — E785 Hyperlipidemia, unspecified: Secondary | ICD-10-CM

## 2023-12-10 DIAGNOSIS — G8929 Other chronic pain: Secondary | ICD-10-CM

## 2023-12-10 DIAGNOSIS — Z23 Encounter for immunization: Secondary | ICD-10-CM | POA: Diagnosis not present

## 2023-12-10 DIAGNOSIS — E039 Hypothyroidism, unspecified: Secondary | ICD-10-CM

## 2023-12-10 DIAGNOSIS — G894 Chronic pain syndrome: Secondary | ICD-10-CM

## 2023-12-10 DIAGNOSIS — M5416 Radiculopathy, lumbar region: Secondary | ICD-10-CM

## 2023-12-10 LAB — COMPREHENSIVE METABOLIC PANEL WITH GFR
ALT: 19 U/L (ref 0–35)
AST: 21 U/L (ref 0–37)
Albumin: 4.5 g/dL (ref 3.5–5.2)
Alkaline Phosphatase: 67 U/L (ref 39–117)
BUN: 19 mg/dL (ref 6–23)
CO2: 30 meq/L (ref 19–32)
Calcium: 9.4 mg/dL (ref 8.4–10.5)
Chloride: 105 meq/L (ref 96–112)
Creatinine, Ser: 0.74 mg/dL (ref 0.40–1.20)
GFR: 77.36 mL/min (ref >60.00)
Glucose, Bld: 110 mg/dL — ABNORMAL HIGH (ref 70–99)
Potassium: 5.2 meq/L — ABNORMAL HIGH (ref 3.5–5.1)
Sodium: 140 meq/L (ref 135–145)
Total Bilirubin: 0.4 mg/dL (ref 0.2–1.2)
Total Protein: 6.9 g/dL (ref 6.0–8.3)

## 2023-12-10 MED ORDER — ALENDRONATE SODIUM 70 MG PO TABS: 70.0000 mg | ORAL_TABLET | ORAL | 3 refills | Status: AC

## 2023-12-10 MED ORDER — SERTRALINE HCL 100 MG PO TABS: 100.0000 mg | ORAL_TABLET | Freq: Every day | ORAL | 3 refills | Status: AC

## 2023-12-10 MED ORDER — LOVASTATIN 40 MG PO TABS: ORAL_TABLET | ORAL | 3 refills | Status: AC

## 2023-12-10 MED ORDER — CLONAZEPAM 0.5 MG PO TABS: 0.5000 mg | ORAL_TABLET | Freq: Every day | ORAL | 5 refills | Status: AC

## 2023-12-10 MED ORDER — GABAPENTIN 300 MG PO CAPS: 300.0000 mg | ORAL_CAPSULE | Freq: Two times a day (BID) | ORAL | 3 refills | Status: AC

## 2023-12-10 NOTE — Progress Notes (Signed)
 Leron Glance, NP-C Phone: [phone]  Jenny Roberts is a 78 y.o. female who presents today for follow up.   Discussed the use of AI scribe software for clinical note transcription with the patient, who gave verbal consent to proceed.  History of Present Illness   Jenny Roberts is a 78 year old female who presents for follow-up on thyroid  regulation.  She is experiencing ongoing issues with thyroid  regulation, with inconsistent thyroid  levels despite taking levothyroxine  88 mcg daily on an empty stomach first thing in the morning. Symptoms such as dry skin and feeling cold persist. No heart palpitations.  She is managing osteoporosis with Fosamax  and reports no new issues related to this condition. Additionally, she is on Zoloft  and Klonopin  for mood, anxiety, and depression, which she takes at night. She feels that her mood is well-controlled with this regimen.  She recently moved from her long-term home of 30 years due to a corporate buyout of the land. This move has been challenging, impacting her sleep and overall adjustment to a new living environment. She now [place] neighborhood in [place]  She continues to take medication for cholesterol management and reports that her new hip is functioning well, allowing her to manage daily activities at home. She has received her flu shot and has a mammogram scheduled for December 1st.      Social History   Tobacco Use  Smoking Status Never  Smokeless Tobacco Never    Current Outpatient Medications on File Prior to Visit  Medication Sig Dispense Refill   acetaminophen  (TYLENOL ) 500 MG tablet Take 1,000 mg by mouth every 6 (six) hours as needed (pain.).     Cholecalciferol  (QC VITAMIN D3) 50 MCG (2000 UT) CAPS Take 2 capsules by mouth daily.     levothyroxine  (SYNTHROID ) 88 MCG tablet Take 1 tablet (88 mcg total) by mouth daily before breakfast. 90 tablet 0    Lidocaine  HCl (ICY HOT MAX LIDOCAINE ) 4 % LIQD Apply 1 Application topically 3 (three) times daily as needed (pain.).     No current facility-administered medications on file prior to visit.     ROS see history of present illness  Objective  Physical Exam Vitals:   12/10/23 1107  BP: 120/84  Pulse: 70  Temp: 98.4 F (36.9 C)  SpO2: 95%    BP Readings from Last 3 Encounters:  12/10/23 120/84  06/10/23 122/76  04/18/23 (!) 105/58   Wt Readings from Last 3 Encounters:  12/10/23 139 lb 6.4 oz (63.2 kg)  09/08/23 140 lb (63.5 kg)  06/10/23 143 lb 9.6 oz (65.1 kg)    Physical Exam Constitutional:      General: She is not in acute distress.    Appearance: Normal appearance.  HENT:     Head: Normocephalic.  Cardiovascular:     Rate and Rhythm: Normal rate and regular rhythm.     Heart sounds: Normal heart sounds.  Pulmonary:     Effort: Pulmonary effort is normal.     Breath sounds: Normal breath sounds.  Skin:    General: Skin is warm and dry.  Neurological:     General: No focal deficit present.     Mental Status: She is alert.  Psychiatric:        Mood and Affect: Mood normal.        Behavior: Behavior normal.      Assessment/Plan: Please see individual problem list.  Hypothyroidism,  unspecified type Assessment & Plan: Chronic hypothyroidism with fluctuating TSH levels presents with dry skin and feeling cold, likely due to thyroid  dysfunction. Levothyroxine  dose was adjusted in September. Emphasized the importance of medication adherence. Check TSH levels today. Continue levothyroxine  88 mcg daily.  Orders: -     TSH+T4F+T3Free+ThyAbs+TPO+VD25  Anxiety state Assessment & Plan: Well controlled with Klonopin  and Zoloft . Medications well tolerated. Continue Zoloft  100 mg daily and Klonopin  0.5 mg daily at bedtime as needed. Refills sent. PDMP reviewed.   Orders: -     clonazePAM ; Take 1 tablet (0.5 mg total) by mouth at bedtime.  Dispense: 30 tablet;  Refill: 5 -     Sertraline  HCl; Take 1 tablet (100 mg total) by mouth at bedtime.  Dispense: 90 tablet; Refill: 3  Sleeping difficulty -     clonazePAM ; Take 1 tablet (0.5 mg total) by mouth at bedtime.  Dispense: 30 tablet; Refill: 5  Hyperlipidemia, unspecified hyperlipidemia type Assessment & Plan: Managed with lovastatin . No reported side effects. Continue lovastatin  40 mg daily.   Orders: -     Lovastatin ; TAKE 1 TABLET(40 MG) BY MOUTH DAILY  Dispense: 90 tablet; Refill: 3 -     Comprehensive metabolic panel with GFR  Age-related osteoporosis without current pathological fracture Assessment & Plan: Managed with Fosamax . No new symptoms or complications. Continue Fosamax  70 mg weekly and vitamin D  supplementation. Check vitamin D  level.   Orders: -     Alendronate  Sodium; Take 1 tablet (70 mg total) by mouth every 7 (seven) days. Take with a full glass of water on an empty stomach.  Dispense: 12 tablet; Refill: 3 -     TSH+T4F+T3Free+ThyAbs+TPO+VD25  Chronic radicular lumbar pain Assessment & Plan: Managed with Gabapentin  300 mg twice daily. Symptoms adequately controlled. Continue gabapentin . PDMP reviewed. We will continue to monitor.   Orders: -     Gabapentin ; Take 1 capsule (300 mg total) by mouth 2 (two) times daily.  Dispense: 180 capsule; Refill: 3  Chronic pain syndrome -     Gabapentin ; Take 1 capsule (300 mg total) by mouth 2 (two) times daily.  Dispense: 180 capsule; Refill: 3  Need for influenza vaccination -     Flu vaccine HIGH DOSE PF(Fluzone Trivalent)      Return in about 6 months (around 06/08/2024) for Follow up.   Leron Glance, NP-C [facility]

## 2023-12-11 LAB — TSH+T4F+T3FREE+THYABS+TPO+VD25
Free T4: 1.18 ng/dL (ref 0.82–1.77)
T3, Free: 2.7 pg/mL (ref 2.0–4.4)
TSH: 2.91 u[IU]/mL (ref 0.450–4.500)
Thyroglobulin Antibody: 3 [IU]/mL — ABNORMAL HIGH (ref 0.0–0.9)
Thyroperoxidase Ab SerPl-aCnc: 17 [IU]/mL (ref 0–34)
Vit D, 25-Hydroxy: 40.2 ng/mL (ref 30.0–100.0)

## 2023-12-16 ENCOUNTER — Ambulatory Visit: Payer: Self-pay | Admitting: Nurse Practitioner

## 2023-12-20 ENCOUNTER — Other Ambulatory Visit: Payer: Self-pay | Admitting: Nurse Practitioner

## 2023-12-20 DIAGNOSIS — G894 Chronic pain syndrome: Secondary | ICD-10-CM

## 2023-12-20 DIAGNOSIS — G8929 Other chronic pain: Secondary | ICD-10-CM

## 2023-12-22 ENCOUNTER — Ambulatory Visit
Admission: RE | Admit: 2023-12-22 | Discharge: 2023-12-22 | Disposition: A | Source: Ambulatory Visit | Attending: Nurse Practitioner

## 2023-12-22 DIAGNOSIS — Z1231 Encounter for screening mammogram for malignant neoplasm of breast: Secondary | ICD-10-CM | POA: Insufficient documentation

## 2023-12-23 ENCOUNTER — Encounter: Payer: Self-pay | Admitting: Nurse Practitioner

## 2023-12-23 NOTE — Assessment & Plan Note (Signed)
 Managed with Fosamax . No new symptoms or complications. Continue Fosamax  70 mg weekly and vitamin D  supplementation. Check vitamin D  level.

## 2023-12-23 NOTE — Assessment & Plan Note (Signed)
 Managed with Gabapentin  300 mg twice daily. Symptoms adequately controlled. Continue gabapentin . PDMP reviewed. We will continue to monitor.

## 2023-12-23 NOTE — Assessment & Plan Note (Signed)
 Managed with lovastatin . No reported side effects. Continue lovastatin  40 mg daily.

## 2023-12-23 NOTE — Assessment & Plan Note (Signed)
 Chronic hypothyroidism with fluctuating TSH levels presents with dry skin and feeling cold, likely due to thyroid  dysfunction. Levothyroxine  dose was adjusted in September. Emphasized the importance of medication adherence. Check TSH levels today. Continue levothyroxine  88 mcg daily.

## 2023-12-23 NOTE — Assessment & Plan Note (Signed)
 Well controlled with Klonopin  and Zoloft . Medications well tolerated. Continue Zoloft  100 mg daily and Klonopin  0.5 mg daily at bedtime as needed. Refills sent. PDMP reviewed.

## 2023-12-25 ENCOUNTER — Ambulatory Visit: Payer: Self-pay | Admitting: Nurse Practitioner

## 2023-12-27 ENCOUNTER — Other Ambulatory Visit: Payer: Self-pay | Admitting: Nurse Practitioner

## 2023-12-27 DIAGNOSIS — G479 Sleep disorder, unspecified: Secondary | ICD-10-CM

## 2023-12-27 DIAGNOSIS — F411 Generalized anxiety disorder: Secondary | ICD-10-CM

## 2024-01-01 DIAGNOSIS — H353131 Nonexudative age-related macular degeneration, bilateral, early dry stage: Secondary | ICD-10-CM | POA: Diagnosis not present

## 2024-01-01 DIAGNOSIS — H26493 Other secondary cataract, bilateral: Secondary | ICD-10-CM | POA: Diagnosis not present

## 2024-01-01 DIAGNOSIS — H524 Presbyopia: Secondary | ICD-10-CM | POA: Diagnosis not present

## 2024-01-05 ENCOUNTER — Other Ambulatory Visit: Payer: Self-pay

## 2024-01-05 DIAGNOSIS — E039 Hypothyroidism, unspecified: Secondary | ICD-10-CM

## 2024-01-05 MED ORDER — LEVOTHYROXINE SODIUM 88 MCG PO TABS: 88.0000 ug | ORAL_TABLET | Freq: Every day | ORAL | 3 refills | Status: AC

## 2024-06-09 ENCOUNTER — Ambulatory Visit: Admitting: Nurse Practitioner

## 2024-09-08 ENCOUNTER — Ambulatory Visit
